# Patient Record
Sex: Male | Born: 1935 | Hispanic: No
Health system: Northeastern US, Academic
[De-identification: ages and names within clinical notes are randomized; demographics above are authoritative.]

---

## 2012-12-10 IMAGING — CR Chest Single AP view
1 series · 1 of 1 positions shown · non-contrast
Comparison: none

Final Report

Chest radiograph
  for patient Jimi, Proshen 
    male of [AGE]
CLINICAL INFORMATION: Shortness of breath.
TECHNIQUE: Frontal view of the chest were obtained.

[AP]
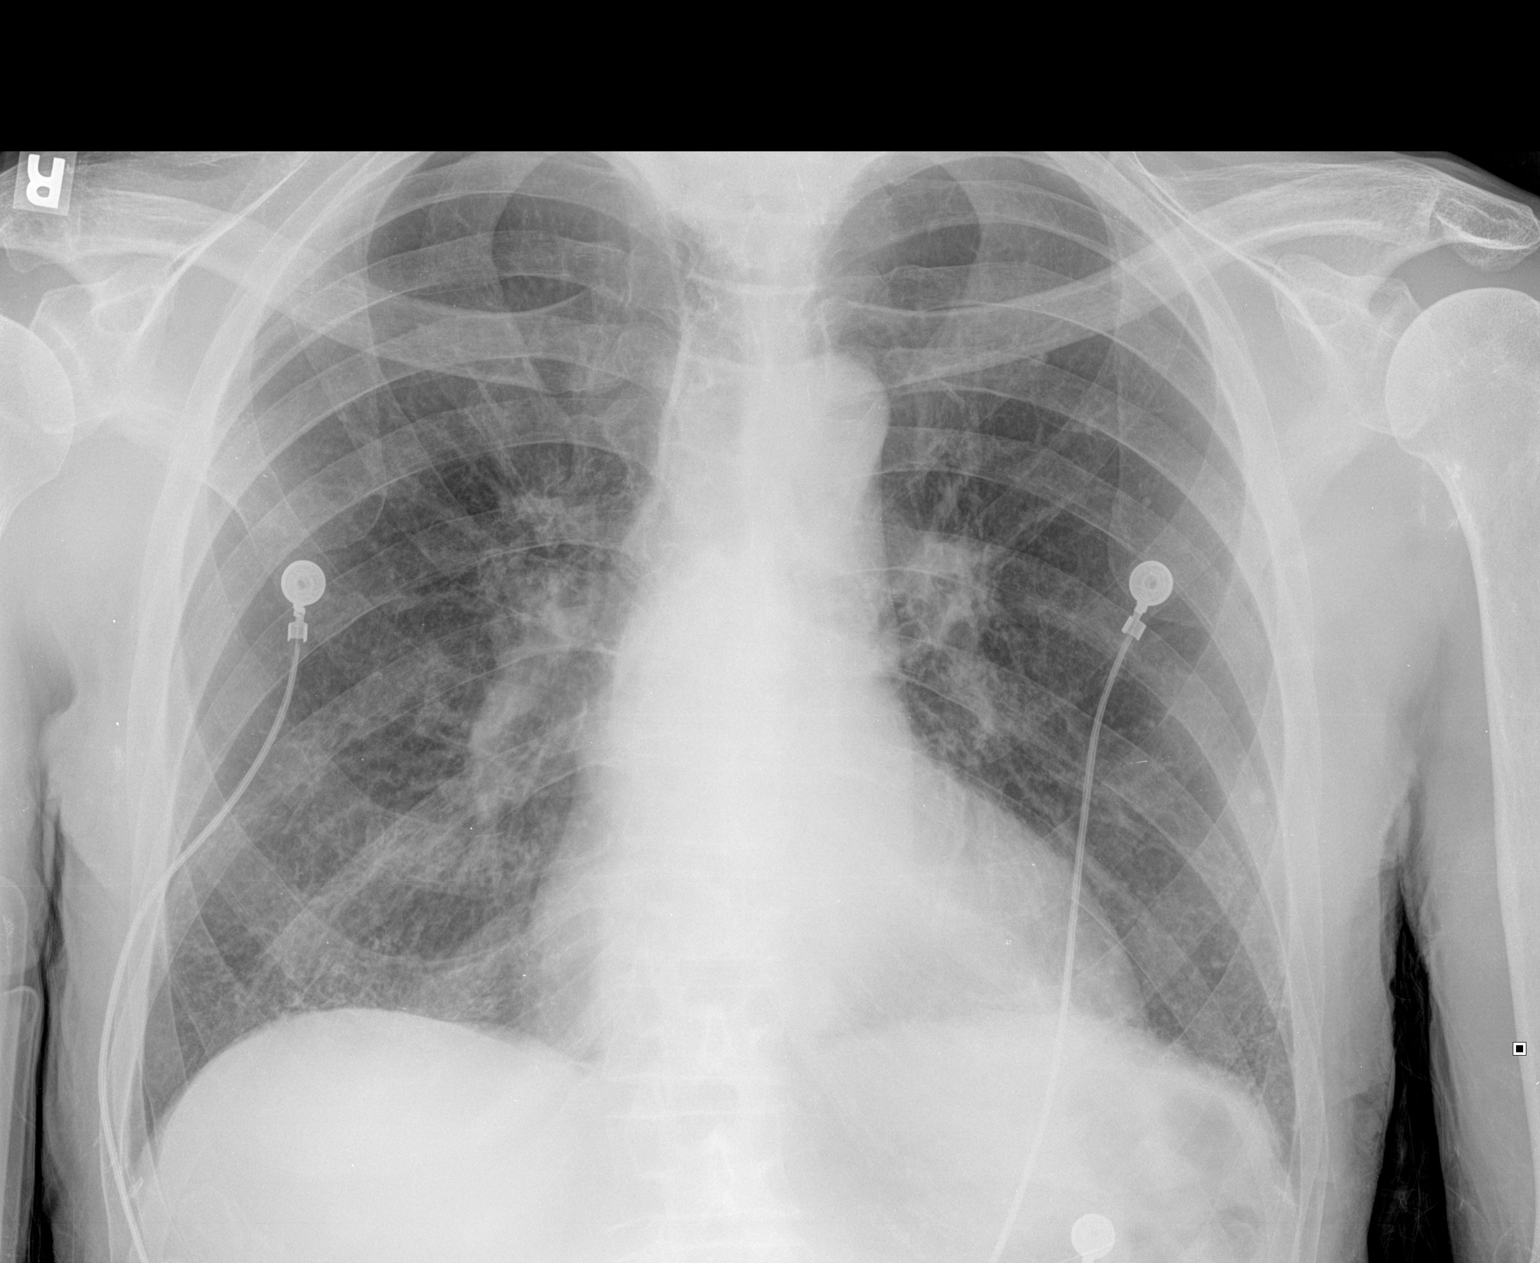

[1 of 1 positions shown; findings below may reference images not displayed]

FINDINGS: No prior studies were submitted for direct comparison.

The cardiomediastinal silhouette appears to be unremarkable.  The 

pulmonary vasculature is within normal limits.  There is no evidence 

of acute infiltrate.  The bilateral costophrenic angles are sharp.  

The visualized bony thoracic cage appears intact.  There is a 0.4 cm 

left midlung field calcified granuloma noted.
IMPRESSION: No evidence of acute pulmonary disease.

0.4 cm left midlung field calcified granuloma.

## 2019-11-07 LAB — SARS COV-2 (COVID-19) RNA: BKR SARS-COV-2 RNA (COVID-19) (YH): NOT DETECTED

## 2019-11-11 LAB — SARS COV-2 (COVID-19) RNA: BKR SARS-COV-2 RNA (COVID-19) (YH): NEGATIVE

## 2019-11-15 LAB — SARS COV-2 (COVID-19) RNA: BKR SARS-COV-2 RNA (COVID-19) (YH): NEGATIVE

## 2019-11-19 LAB — SARS COV-2 (COVID-19) RNA: BKR SARS-COV-2 RNA (COVID-19) (YH): NOT DETECTED

## 2019-11-29 LAB — SARS COV-2 (COVID-19) RNA: BKR SARS-COV-2 RNA (COVID-19) (YH): NOT DETECTED

## 2020-02-09 ENCOUNTER — Emergency Department: Admit: 2020-02-09 | Payer: PRIVATE HEALTH INSURANCE | Primary: Internal Medicine

## 2020-02-09 ENCOUNTER — Inpatient Hospital Stay: Admit: 2020-02-09 | Payer: PRIVATE HEALTH INSURANCE

## 2020-02-09 ENCOUNTER — Inpatient Hospital Stay
Admit: 2020-02-09 | Discharge: 2020-02-12 | Payer: MEDICARE | Source: Skilled Nursing Facility | Admitting: Internal Medicine

## 2020-02-09 ENCOUNTER — Encounter: Admit: 2020-02-09 | Payer: PRIVATE HEALTH INSURANCE | Primary: Internal Medicine

## 2020-02-09 DIAGNOSIS — K219 Gastro-esophageal reflux disease without esophagitis: Secondary | ICD-10-CM

## 2020-02-09 DIAGNOSIS — E569 Vitamin deficiency, unspecified: Secondary | ICD-10-CM

## 2020-02-09 DIAGNOSIS — F329 Major depressive disorder, single episode, unspecified: Secondary | ICD-10-CM

## 2020-02-09 DIAGNOSIS — M5417 Radiculopathy, lumbosacral region: Secondary | ICD-10-CM

## 2020-02-09 DIAGNOSIS — I4891 Unspecified atrial fibrillation: Secondary | ICD-10-CM

## 2020-02-09 DIAGNOSIS — S42212A Unspecified displaced fracture of surgical neck of left humerus, initial encounter for closed fracture: Secondary | ICD-10-CM

## 2020-02-09 DIAGNOSIS — E785 Hyperlipidemia, unspecified: Secondary | ICD-10-CM

## 2020-02-09 DIAGNOSIS — I480 Paroxysmal atrial fibrillation: Secondary | ICD-10-CM

## 2020-02-09 DIAGNOSIS — K589 Irritable bowel syndrome without diarrhea: Secondary | ICD-10-CM

## 2020-02-09 DIAGNOSIS — S72002A Fracture of unspecified part of neck of left femur, initial encounter for closed fracture: Secondary | ICD-10-CM

## 2020-02-09 DIAGNOSIS — F419 Anxiety disorder, unspecified: Secondary | ICD-10-CM

## 2020-02-09 DIAGNOSIS — M40209 Unspecified kyphosis, site unspecified: Secondary | ICD-10-CM

## 2020-02-09 DIAGNOSIS — J189 Pneumonia, unspecified organism: Secondary | ICD-10-CM

## 2020-02-09 DIAGNOSIS — E039 Hypothyroidism, unspecified: Secondary | ICD-10-CM

## 2020-02-09 DIAGNOSIS — H409 Unspecified glaucoma: Secondary | ICD-10-CM

## 2020-02-09 DIAGNOSIS — I10 Essential (primary) hypertension: Secondary | ICD-10-CM

## 2020-02-09 DIAGNOSIS — M1990 Unspecified osteoarthritis, unspecified site: Secondary | ICD-10-CM

## 2020-02-09 DIAGNOSIS — E119 Type 2 diabetes mellitus without complications: Secondary | ICD-10-CM

## 2020-02-09 LAB — BASIC METABOLIC PANEL
BKR ANION GAP: 8 (ref 5–18)
BKR BLOOD UREA NITROGEN: 23 mg/dL (ref 8–25)
BKR BUN / CREAT RATIO: 20.7 (ref 8.0–25.0)
BKR CALCIUM: 8.5 mg/dL (ref 8.4–10.3)
BKR CHLORIDE: 109 mmol/L (ref 95–115)
BKR CO2: 25 mmol/L (ref 21–32)
BKR CREATININE: 1.11 mg/dL (ref 0.50–1.30)
BKR EGFR (AFR AMER): >60 mL/min/{1.73_m2} (ref >60)
BKR EGFR (NON AFRICAN AMERICAN): >60 mL/min/{1.73_m2} (ref >60)
BKR GLUCOSE: 110 mg/dL — ABNORMAL HIGH (ref 70–100)
BKR OSMOLALITY CALCULATION: 287 mosm/kg (ref 275–295)
BKR POTASSIUM: 4.5 mmol/L (ref 3.5–5.1)
BKR SODIUM: 142 mmol/L (ref 136–145)

## 2020-02-09 LAB — CBC WITH AUTO DIFFERENTIAL
BKR WAM ABSOLUTE IMMATURE GRANULOCYTES: 0 x 1000/ÂµL (ref 0.0–0.2)
BKR WAM ABSOLUTE LYMPHOCYTE COUNT: 1.8 x 1000/ÂµL (ref 1.0–2.3)
BKR WAM ABSOLUTE NRBC: 0 x 1000/ÂµL (ref <=0.0)
BKR WAM ANALYZER ANC: 7.1 x 1000/ÂµL (ref 1.8–7.3)
BKR WAM BASOPHIL ABSOLUTE COUNT: 0.1 x 1000/ÂµL (ref 0.0–0.6)
BKR WAM BASOPHILS: 0.6 % (ref 0.0–2.0)
BKR WAM EOSINOPHIL ABSOLUTE COUNT: 0.3 x 1000/ÂµL (ref 0.0–0.4)
BKR WAM EOSINOPHILS: 3.2 % (ref 0.0–6.0)
BKR WAM HEMATOCRIT: 40.5 % — ABNORMAL LOW (ref 41.0–54.0)
BKR WAM HEMOGLOBIN: 13.3 g/dL — ABNORMAL LOW (ref 13.7–18.0)
BKR WAM IMMATURE GRANULOCYTES: 0.3 % (ref 0.0–2.0)
BKR WAM LYMPHOCYTES: 17.5 % (ref 14.0–43.0)
BKR WAM MCH (PG): 30.7 pg (ref 25.7–31.0)
BKR WAM MCHC: 32.8 g/dL (ref 32.0–36.0)
BKR WAM MCV: 93.5 fL (ref 80.0–99.0)
BKR WAM MONOCYTE ABSOLUTE COUNT: 0.9 x 1000/ÂµL (ref 0.4–1.3)
BKR WAM MONOCYTES: 8.5 % (ref 0.0–14.0)
BKR WAM MPV: 9.8 fL (ref 9.8–12.3)
BKR WAM NEUTROPHILS: 69.9 % (ref 38.0–74.0)
BKR WAM NUCLEATED RED BLOOD CELLS: 0 % (ref 0.0–0.2)
BKR WAM PLATELETS: 182 x1000/ÂµL (ref 140–446)
BKR WAM RDW-CV: 12.4 % (ref 11.5–14.5)
BKR WAM RED BLOOD CELL COUNT: 4.3 M/ÂµL — ABNORMAL LOW (ref 4.6–6.1)
BKR WAM WHITE BLOOD CELL COUNT: 10.2 x1000/ÂµL (ref 3.8–10.6)

## 2020-02-09 LAB — ZZZURINALYSIS WITH CULTURE REFLEX     (L Q)
BKR BILIRUBIN, UA: NEGATIVE
BKR BLOOD, UA: NEGATIVE
BKR GLUCOSE, UA: NEGATIVE
BKR KETONES, UA: NEGATIVE
BKR LEUKOCYTE ESTERASE, UA: NEGATIVE
BKR NITRITE, UA: NEGATIVE
BKR PH, UA: 6 (ref 5.5–7.5)
BKR PROTEIN, UA: NEGATIVE
BKR SPECIFIC GRAVITY, UA: 1.015 (ref 1.005–1.030)
BKR UROBILINOGEN, UA: <2 EU/dL (ref <=2.0)

## 2020-02-09 LAB — EXTRA BLUE TOP SPECIMEN     (BH GH LMW)

## 2020-02-09 LAB — TROPONIN I     (L Q): BKR TROPONIN I: 0.015 ng/mL (ref 0.000–1.500)

## 2020-02-09 LAB — SARS COV-2 (COVID-19) RNA: BKR SARS-COV-2 RNA (COVID-19) (YH): NEGATIVE

## 2020-02-09 MED ORDER — OXYCODONE IMMEDIATE RELEASE 5 MG TABLET
5 mg | Freq: Four times a day (QID) | ORAL | Status: DC | PRN
Start: 2020-02-09 — End: 2020-02-10

## 2020-02-09 MED ORDER — ACETAMINOPHEN 325 MG TABLET
325 mg | Freq: Four times a day (QID) | ORAL | Status: DC | PRN
Start: 2020-02-09 — End: 2020-02-09
  Administered 2020-02-09: 13:00:00 325 mg via ORAL

## 2020-02-09 MED ORDER — ALUMINUM-MAG HYDROXIDE-SIMETHICONE 200 MG-200 MG-20 MG/5 ML ORAL SUSP
200-200-205 mg/5 mL | Freq: Four times a day (QID) | ORAL | Status: DC | PRN
Start: 2020-02-09 — End: 2020-02-12

## 2020-02-09 MED ORDER — ACETAMINOPHEN 325 MG TABLET
325 mg | Freq: Three times a day (TID) | ORAL | Status: DC
Start: 2020-02-09 — End: 2020-02-12
  Administered 2020-02-09 – 2020-02-12 (×9): 325 mg via ORAL

## 2020-02-09 MED ORDER — SODIUM CHLORIDE 0.9 % (FLUSH) INJECTION SYRINGE
0.9 % | INTRAVENOUS | Status: DC | PRN
Start: 2020-02-09 — End: 2020-02-12

## 2020-02-09 MED ORDER — GUAIFENESIN 100 MG/5 ML ORAL LIQUID
1005 mg/5 mL | ORAL | Status: DC | PRN
Start: 2020-02-09 — End: 2020-02-12

## 2020-02-09 MED ORDER — METOPROLOL SUCCINATE ER (TOPROL) 12.5 MG HALFTAB
12.5 mg | Freq: Every day | ORAL | Status: DC
Start: 2020-02-09 — End: 2020-02-12
  Administered 2020-02-09 – 2020-02-12 (×4): 12.5 mg via ORAL

## 2020-02-09 MED ORDER — FAMOTIDINE 20 MG TABLET
20 mg | Freq: Every day | ORAL | Status: SS
Start: 2020-02-09 — End: 2020-02-12

## 2020-02-09 MED ORDER — ROSUVASTATIN 5 MG TABLET
5 mg | Freq: Every day | ORAL | Status: DC
Start: 2020-02-09 — End: 2020-02-12
  Administered 2020-02-09 – 2020-02-12 (×4): 5 mg via ORAL

## 2020-02-09 MED ORDER — PANTOPRAZOLE 40 MG TABLET,DELAYED RELEASE
40 mg | Freq: Every day | ORAL | Status: DC
Start: 2020-02-09 — End: 2020-02-12
  Administered 2020-02-09 – 2020-02-12 (×4): 40 mg via ORAL

## 2020-02-09 MED ORDER — SODIUM CHLORIDE 0.9 % (FLUSH) INJECTION SYRINGE
0.9 % | Freq: Three times a day (TID) | INTRAVENOUS | Status: DC
Start: 2020-02-09 — End: 2020-02-12
  Administered 2020-02-10 – 2020-02-11 (×2): 0.9 mL via INTRAVENOUS

## 2020-02-09 MED ORDER — MORPHINE 2 MG/ML INJECTION SYRINGE
2 mg/mL | Freq: Once | INTRAVENOUS | Status: CP
Start: 2020-02-09 — End: ?
  Administered 2020-02-09: 06:00:00 2 mL via INTRAVENOUS

## 2020-02-09 MED ORDER — ESCITALOPRAM 10 MG TABLET
10 mg | Freq: Every day | ORAL | Status: DC
Start: 2020-02-09 — End: 2020-02-12
  Administered 2020-02-09 – 2020-02-12 (×4): 10 mg via ORAL

## 2020-02-09 MED ORDER — TAMSULOSIN 0.4 MG CAPSULE
0.4 mg | Freq: Every evening | ORAL | Status: DC
Start: 2020-02-09 — End: 2020-02-09

## 2020-02-09 MED ORDER — TRAMADOL (ULTRAM) 25 MG HALFTAB
25 mg | Freq: Four times a day (QID) | ORAL | Status: DC | PRN
Start: 2020-02-09 — End: 2020-02-12
  Administered 2020-02-09 – 2020-02-12 (×3): 25 mg via ORAL

## 2020-02-09 MED ORDER — KETOTIFEN 0.025 % (0.035 %) EYE DROPS
0.0250.035 % (0.035 %) | Freq: Every day | OPHTHALMIC | Status: DC
Start: 2020-02-09 — End: 2020-02-12
  Administered 2020-02-09 – 2020-02-12 (×4): 0.025 mL via OPHTHALMIC

## 2020-02-09 MED ORDER — GADOTERATE MEGLUMINE 0.5 MMOL/ML (376.9 MG/ML) INTRAVENOUS SOLUTION
0.5 mmol/mL (376.9 mg/mL) | Freq: Once | INTRAVENOUS | Status: CP | PRN
Start: 2020-02-09 — End: ?
  Administered 2020-02-09: 19:00:00 0.5 mL via INTRAVENOUS

## 2020-02-09 MED ORDER — ZZ IMS TEMPLATE
Freq: Every morning | ORAL | Status: DC
Start: 2020-02-09 — End: 2020-02-12
  Administered 2020-02-09 – 2020-02-12 (×4): 175 mcg via ORAL

## 2020-02-09 MED ORDER — SODIUM CHLORIDE 0.9 % (FLUSH) INJECTION SYRINGE
0.9 % | INTRAVENOUS | Status: DC | PRN
Start: 2020-02-09 — End: 2020-02-12
  Administered 2020-02-09: 15:00:00 0.9 mL via INTRAVENOUS

## 2020-02-09 MED ORDER — FINASTERIDE 5 MG TABLET
5 mg | Freq: Every day | ORAL | Status: DC
Start: 2020-02-09 — End: 2020-02-12
  Administered 2020-02-09 – 2020-02-12 (×4): 5 mg via ORAL

## 2020-02-09 MED ORDER — TIMOLOL MALEATE 0.5 % EYE DROPS
0.5 % | Freq: Every day | OPHTHALMIC | Status: DC
Start: 2020-02-09 — End: 2020-02-12
  Administered 2020-02-09 – 2020-02-12 (×4): 0.5 mL via OPHTHALMIC

## 2020-02-09 MED ORDER — MORPHINE 2 MG/ML INJECTION SYRINGE
2 mg/mL | INTRAVENOUS | Status: DC | PRN
Start: 2020-02-09 — End: 2020-02-10

## 2020-02-09 MED ORDER — TAMSULOSIN 0.4 MG CAPSULE
0.4 mg | Freq: Every evening | ORAL | Status: DC
Start: 2020-02-09 — End: 2020-02-12
  Administered 2020-02-10 – 2020-02-12 (×3): 0.4 mg via ORAL

## 2020-02-09 MED ORDER — OLANZAPINE 2.5 MG TABLET
2.5 mg | Freq: Every evening | ORAL | Status: DC
Start: 2020-02-09 — End: 2020-02-12
  Administered 2020-02-10 – 2020-02-12 (×3): 2.5 mg via ORAL

## 2020-02-09 MED ORDER — SODIUM CHLORIDE 0.9 % (FLUSH) INJECTION SYRINGE
0.9 % | Freq: Three times a day (TID) | INTRAVENOUS | Status: DC
Start: 2020-02-09 — End: 2020-02-12
  Administered 2020-02-09 – 2020-02-11 (×3): 0.9 mL via INTRAVENOUS

## 2020-02-09 NOTE — Other
PHARMACY-ASSISTED MEDICATION REPORTPharmacist review of the best possible medication history obtained by the pharmacy medication history technician has been performed.  I have updated the home medication list and identified the following information that may be relevant to this admission. Recommendations:  Please note escitalopram deleted form med reconciliation module since  -reports therapy discontinued by provider per nursing facility. This medication is ordered for inpatient use, please assess appropriateness.  Notes:Pharm Tech (LS): Per nursing staff at the facility, following are NOT current home medications:Escitalopram 10mg  -reports therapy discontinued by provider.Home medication list updated and reviewed.  Pharm Tech (LS): Medication history completed through review of patient's W10 provided by [facility] in [place], [place], dated April 09,2021, review of pharmacy records and phone call to nursing staff at facility.Current Medications Medication Sig  acetaminophen (TYLENOL) 325 MG tablet Take 650 mg by mouth every 6 (six) hours as needed (Pain/Fever).   aluminum-magnesium hydroxide-simethicone (MAALOX) 200-200-20 mg/5 mL suspension Take 30 mLs by mouth every 6 (six) hours as needed for indigestion.   azelastine (OPTIVAR) 0.05 % ophthalmic solution Place 1 drop into both eyes daily.  bismuth subsalicylate (PEPTO BISMOL,KAOPECTATE) 262 mg/15 mL suspension Take 30 mLs by mouth every 6 (six) hours as needed (Diarrhea).  cholecalciferol, vitamin D3, 125 mcg (5,000 unit) tablet Take 5,000 Units by mouth daily.   famotidine (PEPCID) 20 mg tablet Take 20 mg by mouth daily.  finasteride (PROSCAR) 5 mg tablet Take 5 mg by mouth daily.  guaiFENesin (GERI-TUSSIN) 100 mg/5 mL Liqd Take 200 mg by mouth every 4 (four) hours as needed for cough. guaiFENesin (MUCINEX) 600 mg 12 hr extended release tablet Take 1 tablet (600mg ) by mouth every other day as needed for Congestion.  levothyroxine (SYNTHROID, LEVOTHROID) 175 MCG tablet Take 175 mcg by mouth every morning.   magnesium hydroxide (MILK OF MAGNESIA) 400 mg/5 mL suspension Take 30 mLs by mouth daily as needed for constipation.  melatonin 1 mg tablet Take 1 mg by mouth at bedtime.  metoprolol (TOPROL-XL) 25 MG 24 hr tablet Take 0.5 tablets (12.5 mg total) by mouth daily. Take with or immediately following a meal.  OLANZapine (ZYPREXA) 2.5 MG tablet Take 1 tablet (2.5 mg total) by mouth nightly.  omeprazole (PRILOSEC) 20 mg capsule Take 20 mg by mouth 2 times daily (0900, 1700).   pravastatin (PRAVACHOL) 20 MG tablet Take 1 tablet (20 mg total) by mouth daily with dinner.  tamsulosin (FLOMAX) 0.4 mg 24 hr capsule Take 2 capsules (0.8mg ) by mouth at bedtime.  timolol (TIMOPTIC) 0.5 % ophthalmic solution Place 1 drop into both eyes daily.  travoprost (TRAVATAN Z) 0.004 % ophthalmic solution Place 1 drop into both eyes daily.  Shearon Balo, PharmD2:10 PM4/9/2021Phone: Available via [employer] ([facility])

## 2020-02-09 NOTE — Plan of Care
Plan of Care Overview/ Patient Status    Transfer Note Nursing Keith Strickland is a 86 y.o. male transferred from ED with a chief complaint of post fall.  Assisted in to bed via hovermat.  Received patient is Alert and oriented to self .On RA  saturating appropriately  . No signs of Respiratory Distress. Denies Nausea and vomiting. Denies Chest pain and shortness of breath. Lung Sounds Clear upon auscultation. IV heplock, patent and intact. Abdomen soft and non tender. Bowel Sounds normoactive and noted x4 quadrants. +passing flatus.On BedrestWith arm sling to the left side, Pt reports pain when moving.Vitals:  02/09/20 0122 02/09/20 0223 02/09/20 0446 02/09/20 0520 BP: (!) 193/85 (!) 176/76 (!) 160/70 (!) 170/76 Pulse: (!) 54 (!) 59 60 60 Resp: (!) 26 20 18 20  Temp: 98.4 ?F (36.9 ?C)   97.8 ?F (36.6 ?C) TempSrc: Oral   Oral SpO2: 97% 97% 98% 97% Weight: 71.8 kg   71.8 kg Height:    5' 8 (1.727 m) Oriented to room settings ad use of callbell. Private aide at bedside. PO meds given crushed with apple sauce, tolerated well. Rounded patient. Patient is sleeping with no signs of distress. Vital signs taken afebrile and stable.Patient/Family acknowledge understanding of fall prevention education including to call nurse with assistance with ambulation.Denies pain at the moment. Hourly rounding and care done for comfort and safety. Will continue to monitorAide left In the morning. Medications reviewed See flowsheets, patient education and plan of care for additional information. Problem: Adult Inpatient Plan of CareGoal: Plan of Care ReviewOutcome: Interventions implemented as appropriateGoal: Patient-Specific Goal (Individualized)Outcome: Interventions implemented as appropriateGoal: Absence of Hospital-Acquired Illness or InjuryOutcome: Interventions implemented as appropriateGoal: Optimal Comfort and WellbeingOutcome: Interventions implemented as appropriateGoal: Readiness for Transition of CareOutcome: Interventions implemented as appropriate Problem: InfectionGoal: Infection Symptom ResolutionOutcome: Interventions implemented as appropriate

## 2020-02-09 NOTE — Plan of Care
Physical Therapy EvaluationPatient Overview - 02/09/20 1445    Date of Visit / Treatment  Date of Visit / Treatment  02/09/20   Note Type  Evaluation   Progress Report Due  02/09/20   Start Time  1:45pm   Total Treatment Time     Patient Overview  History of Present Illness  86 y.o. M 86 y.o. M PMHX: A-fib,Anxiety, HTN,DM,cavernous malformation, Dementia, COVID 19,gait disorder, BPH. Admitted 2? fall Fx L prox. Humerous(non displaced), Resident of [facility] w/24/7 care. Has RW and W/C.   Precautions  fall;non-weight bearing;left lower extremity   Precautions - Additional Details/Comments  Glaucoma, dementia, NWB L UE.   Social History  (S) other (comment)   Social History - Additional Details/Comments  Resides at [facility] w/pvt 24/7 HHA requires assist w/mobility/ADL's has RW and W/C. in home.   Prior Level of Function  assist with mobility;assist with ADLs   Subjective  I'am home/my L shoulder hurts.   General Observations  Supine in bed AxO to self    Assessment - 02/09/20 1454    Assessment  Cognition (Mentation/Communication)  (S) other (comment)  Has Dementia at base line responds well to verbal and tactile cues.  Skin Integrity/Edema  see skin documentation   Sensation  not tested   Range of Motion  within functional limits except (see comments)   Range of Motion - Additional Details/Comments  L UE N/A 2? SX,   Muscle Strength/Tone  within functional limits except (see comments)   Muscle Strength/Tone - Additional Details/Comments  L UE N/A 2? SX.   Balance  (S) other (comment)  Poor  Functional Mobility - 02/09/20 1457    Functional Mobility  Rolling  Moderate assist;Assist of 1   Supine to/from Sit  Moderate assist;Assist of 1   Sit to/from Stand  (S) Minimum assist;Assist of 1 person + 1 person to manage equipment  With stedy and high surface,  Sit to/from [employer]  (S) Not tested  2? pt going for MRI.  Bed to/from Chair  Not tested   Weight Bearing Restriction with Mobility  maintained   AMPAC Basic Mobility - 02/09/20 1503    AM-PAC - Basic Mobility Screen- How much help from another person do you currently need.....  Turning from your back to your side while in a a flat bed without using rails?  2 - A Lot - Requires a lot of help (maximum to moderate assistance). Can use assistive devices.   Moving from lying on your back to sitting on the side of a flat bed without using bed rails?  2 - A Lot - Requires a lot of help (maximum to moderate assistance). Can use assistive devices.   Moving to and from a bed to a chair (including a wheelchair)?  2 - A Lot - Requires a lot of help (maximum to moderate assistance). Can use assistive devices.   Standing up from a chair using your arms(e.g., wheelchair or bedside chair)?  2 - A Lot - Requires a lot of help (maximum to moderate assistance). Can use assistive devices.   To walk in a hospital room?  1 - Total - Requires total assistance or cannot do it at all.   Climbing 3-5 steps with a railing?  1 - Total - Requires total assistance or cannot do it at all.   AMPAC Mobility Score  10   Highest Level of Mobility TARGET  Mobility Level 4,Transfer to chair  Recommendations for IP Admission - 02/09/20 1504    PT Recommendations for Inpatient Admission  Activity/Level of Assist  (S) out of bed;transfers only;assist of 2;in room  stedy  ADL Recommendations  (S) assist to bathroom;assist of 2;bedside commode  stedy  Other/Comments  OOB<>Chair daily   Clinical Impression/Recommendation - 02/09/20 1505    Clinical Impression / Recommendation  Initial Assessment  86 y.o. M PMHX: As above. Recomend D/C home w/24/7 care and [facility] PT. CM: Ginny aware.   Patient Goal  return home   PT Frequency  Discharge   Reason for Discharge (PT)  no further needs identified Anticipated Discharge Disposition (PT)  Home;Home with 24 hour assistance;Home with Family support;Home Physical Therapy   Equipment Recommendations for Discharge  Patient has all necessary durable medical equipment   PT Handoff - 02/09/20 1507    Handoff Documentation  Handoff  Patient in bed   Handoff Comments  RN: Haylay in attendance

## 2020-02-09 NOTE — Utilization Review (ED)
Message left for Dr Ronita Hipps and Mariane Duval, CC re: patients status, will followUM Status: UR/MD agree on IP status Spoke with Ginny, CC Dr Romona Curls would like patient to remain inpatient, for multiple fracturesChristine Perlie Gold, RN BSNUtilization [phone]

## 2020-02-09 NOTE — Progress Notes
[facility] Progress NoteAttending Provider: Marshell Levan, MD Subjective                                                                              Subjective: Interim History: alert in bed this am, reports some soreness in LUE but no major pain at rest; no dyspnea, fevers, chillsReview of Allergies/Meds/Hx: Review of Allergies/Meds/Hx:I have reviewed the patient's: allergies and current scheduled medications Objective Objective: Vitals:Last 24 hours: Temp:  [97.6 ?F (36.4 ?C)-98.4 ?F (36.9 ?C)] 97.6 ?F (36.4 ?C)Pulse:  [54-60] 56Resp:  [18-26] 20BP: (142-193)/(70-85) 142/76SpO2:  [97 %-98 %] 97 %I/O's:No intake or output data in the 24 hours ending 02/09/20 1419Procedures:Physical Exam Constitutional: No distress. HENT: Head: Normocephalic and atraumatic. Eyes: Pupils are equal, round, and reactive to light. EOM are normal. Neck: Normal range of motion. Neck supple. No JVD present. Cardiovascular: irregular Pulmonary/Chest: Effort normal and breath sounds normal. Abdominal: Soft. Bowel sounds are normal. Musculoskeletal:    Comments: LUE in sling Neurological: No cranial nerve deficit. Alert, clear sensorium; answering simple questions appropriately; has poor vision and is HOH as well; moving all extremities Psychiatric: He has a normal mood and affect. His behavior is normal. Labs:Last 24 hours: Recent Results (from the past 24 hour(s)) EKG  Collection Time: 02/09/20  1:23 AM Result Value Ref Range  Heart Rate 54 bpm  QRS Duration 132 ms  Q-T Interval 474 ms  QTC Calculation(Bezet) 449 ms  P Axis 69 deg  R Axis -76 deg  T Axis 37 deg  P-R Interval 366 msec  ECG - SEVERITY Abnormal ECG severity Troponin I  Collection Time: 02/09/20  1:28 AM Result Value Ref Range  Troponin I 0.015 0.000 - 1.500 ng/mL Extra blue top specimen  Collection Time: 02/09/20  1:28 AM Result Value Ref Range  Hold Specimen Hold  Blood Bank extra specimen  Collection Time: 02/09/20  1:28 AM Result Value Ref Range  Hold BB RECVD  CBC auto differential  Collection Time: 02/09/20  1:28 AM Result Value Ref Range  WBC 10.2 3.8 - 10.6 x1000/?L  RBC 4.3 (L) 4.6 - 6.1 M/?L  Hemoglobin 13.3 (L) 13.7 - 18.0 g/dL  Hematocrit 16.1 (L) 09.6 - 54.0 %  MCV 93.5 80.0 - 99.0 fL  MCHC 32.8 32.0 - 36.0 g/dL  RDW-CV 04.5 40.9 - 81.1 %  Platelets 182 140 - 446 x1000/?L  MPV 9.8 9.8 - 12.3 fL  ANC (Abs Neutrophil Count) 7.1 1.8 - 7.3 x 1000/?L  Neutrophils 69.9 38.0 - 74.0 %  Lymphocytes 17.5 14.0 - 43.0 %  Absolute Lymphocyte Count 1.8 1.0 - 2.3 x 1000/?L  Monocytes 8.5 0.0 - 14.0 %  Monocyte Absolute Count 0.9 0.4 - 1.3 x 1000/?L  Eosinophils 3.2 0.0 - 6.0 %  Eosinophil Absolute Count 0.3 0.0 - 0.4 x 1000/?L  Basophil 0.6 0.0 - 2.0 %  Basophil Absolute Count 0.1 0.0 - 0.6 x 1000/?L  Immature Granulocytes 0.3 0.0 - 2.0 %  Absolute Immature Granulocyte Count 0.0 0.0 - 0.2 x 1000/?L  nRBC 0.0 0.0 - 0.2 %  Absolute nRBC 0.0 <=0.0 x 1000/?L  MCH 30.7 25.7 - 31.0 pg Basic metabolic panel  Collection Time: 02/09/20  1:28 AM Result Value Ref Range  Sodium 142 136 - 145 mmol/L  Potassium 4.5 3.5 - 5.1 mmol/L  Chloride 109 95 - 115 mmol/L  CO2 25 21 - 32 mmol/L  Anion Gap 8 5 - 18  Glucose 110 (H) 70 - 100 mg/dL  BUN 23 8 - 25 mg/dL  Creatinine 4.09 8.11 - 1.30 mg/dL  Calcium 8.5 8.4 - 91.4 mg/dL  BUN/Creatinine Ratio 78.2 8.0 - 25.0  Osmolality Calculation 287 275 - 295 mOsm/kg  eGFR (Afr Amer) >60 >60 mL/min/1.61m2  eGFR (NON African-American) >60 >60 mL/min/1.42m2 SARS CoV-2 (COVID-19) RNA - [facility] LM[facility] Y[facility])  Collection Time: 02/09/20  1:29 AM  Specimen: Nasopharynx; Viral Result Value Ref Range  SARS-CoV-2 RNA (COVID-19) Negative Negative UA with culture reflex  Collection Time: 02/09/20  2:35 AM Specimen: Urine Result Value Ref Range  Clarity, UA Clear Clear  Color, UA Yellow Yellow  Specific Gravity, UA 1.015 1.005 - 1.030  pH, UA 6.0 5.5 - 7.5  Protein, UA Negative Negative-Trace  Glucose, UA Negative Negative  Ketones, UA Negative Negative  Blood, UA Negative Negative  Bilirubin, UA Negative Negative  Leukocytes, UA Negative Negative  Nitrite, UA Negative Negative  Urobilinogen, UA <2.0 <=2.0 EU/dL Diagnostics:Solomons pelvisThe patient is status post left hip arthroplasty. There is no radiographic evidence of hardware complication or failure. There is an age-indeterminate L3 compression fracture with lytic components. There is 4 mm of retropulsion resulting in severe spinal canal stenosis. There is a nonaggressive sclerotic density within the right sacrum. The visualized pelvic viscera are unremarkable. There is no bowel obstruction, free fluid, or free air. There is no pelvic adenopathy.  Impression: ? IMPRESSION: Age-indeterminate L3 compression fracture with lytic components and 4 mm of retropulsion resulting in severe canal stenosis. This raises concern for pathological fracture.  Left shoulder xray:FINDINGS: There is a fracture of the surgical neck of the left humerus with some impaction and medial displacement not fully assessed on the single view.  Impression: ? IMPRESSION: Proximal left humeral fracture.   Assessment Assessment: 86yo male, DNR, from the [facility] (assisted living) w/ atrial fibrillation, hypertension, diabetes?not on medication, cavernous malformation, memory loss, hx of COVID, gait disorder, anxiety,  BPH, hypothyroidism admitted after mechanical fall with fracture left humerus Plan Plan: # Fall with Humerus Fracture - no weight bearing on LUE; arm sling; follow with ortho in 2 weeks with repeat xray ( Dr Alfredo Batty or Dr Lyman Bishop) - PT/OT Evaluation- scheduled Tylenol; prn Tramadol/Oxycodone PRN Pain?# Hx of HTN/HLD -Continue PTA Metoprolol (ER 12.5 mg qDay) and Pravastatin (Sub Crestor)?# Hx of Hypothyroidism - - Continue PTA Synthroid (175 mcg qDay)?# Hx of Mood Disorder - Olanzapine (2.5 mg qHS)?# Hx of GERD -- Continue PTA Omeprazole (Sub Pantoprazole)?# Hx of BPH -- Continue PTA Finasteride (5 mg qDay) and Tamsulosin?# Hx of Glaucoma -- Continue PTA Azelastine (Sub Ketotifen) and Timolol# fracture of L3 on [place] - PT to assess, MRI ( ? Pathologic fracture) DNR Electronically Signed:[id number] Trinnity Breunig, MD Beeper 02/09/2020, 2:18 PM

## 2020-02-09 NOTE — ED Provider Notes
? 2014 CD-Notes ?  All Rights [facility] Emergency Department     Chief Complaint:  Chief Complaint   Patient presents with   ? Fall >65     pt bib [facility] from [facility] s/p mechanical fall while going to the bathroom.  aide was present. denies LOC, head strike. Pt presents alert, c/o left shoulder and left hip pain.   ? Shoulder Pain   ? Hip Pain     HPI:  Keith Strickland, 86 y.o., male, biba for evaluation for a mechanical fall at standing height. The patient's main complaints is pain to his left shoulder. The patient denies hip pain, leg pain, head trauma, neck pain, feeling lighthead or dizziness. Per aide his vision is impaired, he walks with walker and his gait is unsteady as a result. The patient denies feeling lightheaded, dizzy, chest pain, or shortness of breath preceding the fall. Denies head trauma and LOC.     Historian:   patient and aide  Review of Systems:  Constitution:  Denies: fever, chills  EYES:  Negative for vision changes  RESP:  Negative for cough, shortness of breath  CARDIO: Negative for chest pain, palpitations  GI:  Negative for abdominal pain, nausea/vomiting  MUSC:  +right shoulder pain  SKIN:  Negative for rash  NEURO: Denies: dizzy/vertigo, headache, focal weakness, numbness/tingling, loss of consciousness  Past Medical History:  Past Medical History:   Diagnosis Date   ? A-fib (HC Code)    ? Anxiety    ? Closed displaced fracture of left femoral neck (HC Code)    ? DM (diabetes mellitus) (HC Code)    ? GERD (gastroesophageal reflux disease)    ? GERD (gastroesophageal reflux disease)    ? Glaucoma    ? HTN (hypertension)    ? Hyperlipidemia    ? Hypertension    ? Hypothyroid    ? Hypothyroidism    ? IBS (irritable bowel syndrome)    ? Kyphosis    ? Lumbosacral radiculopathy    ? Major depression    ? Osteoarthritis    ? Paroxysmal atrial fibrillation (HC Code)    ? Pneumonia    ? Vitamin deficiency      Past Surgical History:   Procedure Laterality Date   ? FRACTURE SURGERY       Social History:  Social History     Tobacco Use   ? Smoking status: Never Smoker   ? Smokeless tobacco: Never Used   Substance Use Topics   ? Alcohol use: No   ? Drug use: No     Family History:  Family History   Family history unknown: Yes     Medications:     Medication List      ASK your doctor about these medications    acetaminophen 325 mg tablet  Commonly known as: TYLENOL     aluminum-magnesium hydroxide-simethicone 200-200-20 mg/5 mL suspension  Commonly known as: MAALOX     azelastine 0.05 % ophthalmic solution  Commonly known as: OPTIVAR     bismuth subsalicylate 262 mg/15 mL suspension  Commonly known as: PEPTO BISMOL,KAOPECTATE     Carafate 100 mg/mL suspension  Generic drug: sucralfate     cholecalciferol (vitamin D3) 125 mcg (5,000 unit) tablet     cimetidine 200 mg tablet  Commonly known as: TAGAMET     escitalopram oxalate 10 mg tablet  Commonly known as: LEXAPRO     finasteride 5 mg  tablet  Commonly known as: PROSCAR     levothyroxine 175 mcg tablet  Commonly known as: SYNTHROID, LEVOTHROID     magnesium hydroxide 400 mg/5 mL suspension  Commonly known as: MILK OF MAGNESIA     melatonin 1 mg tablet     metoprolol succinate XL 25 mg 24 hr tablet  Commonly known as: TOPROL-XL  Take 0.5 tablets (12.5 mg total) by mouth daily. Take with or immediately following a meal.     * Mucinex 600 mg 12 hr extended release tablet  Generic drug: guaiFENesin     * Geri-Tussin 100 mg/5 mL Liqd  Generic drug: guaiFENesin     OLANZapine 2.5 mg tablet  Commonly known as: ZyPREXA  Take 1 tablet (2.5 mg total) by mouth nightly.     omeprazole 20 mg capsule  Commonly known as: PriLOSEC     pravastatin 20 mg tablet  Commonly known as: PRAVACHOL  Take 1 tablet (20 mg total) by mouth daily with dinner.     Senna Lax 8.6 mg tablet  Generic drug: senna     tamsulosin 0.4 mg 24 hr capsule  Commonly known as: FLOMAX     timolol 0.5 % ophthalmic solution  Commonly known as: TIMOPTIC Travatan Z 0.004 % ophthalmic solution  Generic drug: travoprost         * This list has 2 medication(s) that are the same as other medications prescribed for you. Read the directions carefully, and ask your doctor or other care provider to review them with you.              Allergies as of 02/09/2020 - Review Complete 02/09/2020   Allergen Reaction Noted   ? Gluten protein Other (See Comments) 10/29/2013   ? Lactose Other (See Comments) 10/29/2013   ? Avelox [moxifloxacin]  02/16/2016   ? Ciprofloxacin  11/10/2018   ? Peanut  02/16/2016   ? Penicillins  10/28/2013   ? Sulfa (sulfonamide antibiotics)  10/28/2013     Physical Exam:  Vital signs were reviewed  BP (!) 176/76  - Pulse (!) 59  - Temp 98.4 ?F (36.9 ?C) (Oral)  - Resp 20  - Wt 71.8 kg  - SpO2 97%  - BMI 24.07 kg/m?       General Appear: Elderly male in moderate painful distress    Head:   normocephalic, no masses, lesions, tenderness or       abnormalities   Ears/Nose/Throat: ENT exam normal, no neck nodes or sinus tenderness       and throat normal without erythema or exudate   Eyes:   PERRL, EOMI, conjunctivae clear, anicteric   Neck:   supple, full range of motion, no JVD and no      lymphadenopathy   Cardiovascular: +S1+S2, Regular rate and rhythm.  No murmurs.   Respiratory:  clear without rales, rhonchi or wheezes   Chest:   Chest wall tenderness on palpation over the left anterior and lateral chest wall. No crepitus or bony step off.   Back:    Negative:  FROM, no midline or c-spine tenderness, no step offs palpated    Abdomen:  soft, nontender, nondistended; normoactive bowel sounds. No RRG.   Pelvic:   Pelvis stable to rocking and palpation   Musculoskeletal: Soft tissue swelling over left humerus, Left shoulder exam markedly limited due to pain. No deformity noted to left elbow or wrist. There is left hand ecchymosis noted over the dorsal aspect with a small skin  tear and bony tenderness on palpation. No gross deformity on digits appreciated. Left hip with FROM without pain. No deformity noted over the bilateral femurs, knees, lower legs, and ankles.      Skin:   Capillary refill <2 seconds at fingers and toes, skin warm and dry   Neurologic:  Alert and oriented X3; non-focal neurologic exam       Medical Decision Making:  Nursing notes were reviewed: Yes    I reviewed the following historical information: Medical Records    Oxygen saturation interpretation: 97% on RA    Laboratory studies: Yes  Results for orders placed or performed during the hospital encounter of 02/09/20   SARS CoV-2 (COVID-19) RNA - [facility] ([facility])    Specimen: Nasopharynx; Viral   Result Value Ref Range    SARS-CoV-2 RNA (COVID-19) Negative Negative   Troponin I   Result Value Ref Range    Troponin I 0.015 0.000 - 1.500 ng/mL   CBC auto differential   Result Value Ref Range    WBC 10.2 3.8 - 10.6 x1000/?L    RBC 4.3 (L) 4.6 - 6.1 M/?L    Hemoglobin 13.3 (L) 13.7 - 18.0 g/dL    Hematocrit 96.0 (L) 41.0 - 54.0 %    MCV 93.5 80.0 - 99.0 fL    MCHC 32.8 32.0 - 36.0 g/dL    RDW-CV 45.4 09.8 - 11.9 %    Platelets 182 140 - 446 x1000/?L    MPV 9.8 9.8 - 12.3 fL    ANC (Abs Neutrophil Count) 7.1 1.8 - 7.3 x 1000/?L    Neutrophils 69.9 38.0 - 74.0 %    Lymphocytes 17.5 14.0 - 43.0 %    Absolute Lymphocyte Count 1.8 1.0 - 2.3 x 1000/?L    Monocytes 8.5 0.0 - 14.0 %    Monocyte Absolute Count 0.9 0.4 - 1.3 x 1000/?L    Eosinophils 3.2 0.0 - 6.0 %    Eosinophil Absolute Count 0.3 0.0 - 0.4 x 1000/?L    Basophil 0.6 0.0 - 2.0 %    Basophil Absolute Count 0.1 0.0 - 0.6 x 1000/?L    Immature Granulocytes 0.3 0.0 - 2.0 %    Absolute Immature Granulocyte Count 0.0 0.0 - 0.2 x 1000/?L    nRBC 0.0 0.0 - 0.2 %    Absolute nRBC 0.0 <=0.0 x 1000/?L    MCH 30.7 25.7 - 31.0 pg   Basic metabolic panel   Result Value Ref Range    Sodium 142 136 - 145 mmol/L    Potassium 4.5 3.5 - 5.1 mmol/L    Chloride 109 95 - 115 mmol/L    CO2 25 21 - 32 mmol/L    Anion Gap 8 5 - 18    Glucose 110 (H) 70 - 100 mg/dL    BUN 23 8 - 25 mg/dL    Creatinine 1.47 8.29 - 1.30 mg/dL    Calcium 8.5 8.4 - 56.2 mg/dL    BUN/Creatinine Ratio 20.7 8.0 - 25.0    Osmolality Calculation 287 275 - 295 mOsm/kg    eGFR (Afr Amer) >60 >60 mL/min/1.45m2    eGFR (NON African-American) >60 >60 mL/min/1.33m2   UA with culture reflex    Specimen: Urine   Result Value Ref Range    Clarity, UA Clear Clear    Color, UA Yellow Yellow    Specific Gravity, UA 1.015 1.005 - 1.030    pH, UA 6.0 5.5 - 7.5  Protein, UA Negative Negative-Trace    Glucose, UA Negative Negative    Ketones, UA Negative Negative    Blood, UA Negative Negative    Bilirubin, UA Negative Negative    Leukocytes, UA Negative Negative    Nitrite, UA Negative Negative    Urobilinogen, UA <2.0 <=2.0 EU/dL   EKG   Result Value Ref Range    Heart Rate 54 bpm    QRS Duration 132 ms    Q-T Interval 474 ms    QTC Calculation(Bezet) 449 ms    P Axis 69 deg    R Axis -76 deg    T Axis 37 deg    P-R Interval 366 msec    ECG - SEVERITY Abnormal ECG severity       Radiologic Imaging studies:      CXR: No focal infiltrate    Left shoulder: (+) fracture    Left Hand: ? Nondisplaced fracture 5th MCP shaft    [place] Head: Findings:  There is no evidence of intracranial hemorrhage, mass effect or midline shift. There are periventricular  white matter hypodensities, compatible with chronic small vessel ischemia. Again noted is a 3 mm  hyperdensity in the right temporal lobe/right sylvian fissure (axial images 14/52, coronal image 39/68,  sagittal image 48/68), which appears slightly faint compared to the prior study. There is significant  volume loss. There is atheromatous calcification of the intracranial arteries. Again noted is a partial  empty sella. The calvarium is unremarkable. There is mild mucosal thickening in the right maxillary  and bilateral ethmoidal sinuses. The mastoid air cells and the other visualized paranasal sinuses are  clear.  Impression:  1. No evidence of intracranial hemorrhage, mass effect or calvarial fracture.  2. Chronic small vessel ischemia and volume loss.  3. A 3 mm hyperdensity in the right temporal lobe/right sylvian fissure appearing slightly faint  compared to prior study; likely representing calcification within the right MCA branch; however  possibility of a small fusiform/saccular aneurysm cannot be excluded. Recommend further  evaluation with MRI/MRA if clinically indicated      [place] Chest: Findings:  Emphysematous changes are noted in the lungs. There is mild bronchiectasis with bronchial wall  thickening of the lungs, predominantly in both lower lobes. Bibasilar dependent atelectasis is seen.  There is no pneumothorax or pleural effusion.  The thoracic aorta demonstrates atheromatous calcification. There is 3.8 cm fusiform ectasia of the  ascending thoracic aorta. Mild cardiomegaly is seen. There is no pericardial effusion. No evidence of  mediastinal hematoma on this noncontrast study.  There is an acute comminuted displaced fracture at the proximal shaft of the left humerus with  adjacent soft tissue swelling (coronal images 54-62/136). Again noted is a chronic superior endplate  compression fracture of the T3 vertebra.  Nonspecific perinephric fat stranding is noted bilaterally. There is thickening versus underdistention of  the stomach, incompletely imaged.  Impression:  Acute comminuted displaced fracture at the proximal shaft of the left humerus  No evidence of acute visceral injury to the thorax on this noncontrast study    [place] Pelvis:Findings:  There is an age indeterminate superior endplate compression fracture of the L3 vertebra with sclerosis  and lucency, suspicious for a pathological fracture. There is 4 mm retropulsion of the posterior  fragment causing moderate to severe spinal canal stenosis.  The bones are osteopenic. Osseous degenerative changes are identified in the spine and right hip  joint. Left hip prosthesis is seen, appears intact.  No evidence  of bowel obstruction. The urinary bladder is markedly distended. There is no free fluid or  free air.  Impression:  Age indeterminate superior endplate compression fracture of the L3 vertebra with sclerosis and  lucency, suspicious for a pathological fracture. A 4 mm retropulsion of the posterior fragment causing  moderate to severe spinal canal stenosis. Recommend further evaluation with MRI, as clinically  indicated.    Independently interpreted by ED provider:  X-ray    Discussed patient with another provider:  Radiologist       ED Course/Assessment/Plan:    86 y.o. male presents with mechanical fall from standing height. Concerned for possible left humerus fx. Clinically there is no evidence of a hip fx. Because of the patient's age and limitation in exam will obtain a [place]as of the head, Chest, Pelvis, XR of the left shoulder, and left hip. Morphine ordered.  EKG shows no injury pattern or arrhythmia.    3:07 AM  X-ray suspicious for an acute fracture of the left proximal humerus which is likely the culprit causing the majority of his pain.  There may be a nondisplaced left 5th MCP shaft fracture. No evidence of acute hip or pelvis fracture, however radiologist noted the possibility of a L3 vertebra fracture suspicious for being pathological.  Given that the patient's gait is poor, requires ambulation with a walker, having her left arm immobilized in a sling would certainly impair mobility.  The patient will need to be admitted to the hospital for PT evaluation and pain control prior to discharge back to nursing home for safe discharge.    3:58 AM  Case discussed with hospitalist Dr. Katrinka Blazing.    ______________________________________________________________________  Condition:  Stable  Disposition:  Admitted  Diagnosis:  The primary encounter diagnosis was Closed fracture of proximal end of left humerus, unspecified fracture morphology, initial encounter. Diagnoses of Hand fracture, left, closed, initial encounter and Fall, initial encounter were also pertinent to this visit.    By signing my name below, I, Kirby Funk, attest that this documentation has been prepared under the direction and in the presence of Jearld Fenton Shang-Tung*   Electronically Signed: Kirby Funk, scribe. 02/09/2020. 1:40 AM.  Macy Mis*, personally performed the services described in this documentation. All medical record entries made by the scribe were at my direction and in my presence. I have reviewed the chart and discharge instructions and agree that the record reflects my personal performance and is accurate and complete. Jearld Fenton Shang-Tung*. 02/09/2020. 1:40 AM.      ?2014 CD-Notes?  LLC All Rights Reserved; version 2.0; revised November, 2018.  cdnotes/ffall               Phylliss Blakes, MD  02/09/20 [id number]

## 2020-02-09 NOTE — Plan of Care
Plan of Care Overview/ Patient Status    SOCIAL WORK NOTEPatient Name: Keith Strickland Record Number: [id number] Date of Birth: 01-22-1920Social Work Follow Up    Most Recent Value Document Type  Progress Note (For Inpatient/ED Only) Prior psychosocial assessment has been documented within 30 days of this hospitalization  No Reason for [facility] Resources Intervention  Resource Needs Assessment & Referral Resource Needs Assessment & Referral  Community Resources Source of Information  Medical Team, Family/Caregiver Family/Caregiver Comment  Daughter is in agreement with this plan as a back up in case patient cannot return to Assisted Living residence. Medical Team Comment  RN Case Manager requested Screen Record Reviewed  Yes Level of Care  Inpatient Identified Clinical/Disposition, Issues/Barriers:  Social Work consult for [facility] Screen Intervention(s)/Summary  5 minutes spent completing Screen and signing for patient's daughter who is in agreement with plan for rehab at the [facility] if he cannot return directly to the Assisted Living at [place]. Collaboration with Treatment Team/Community Providers/Family:  Screen faxed to [facility] Admissions Outcome  Resolved Handoff Required?  No Barriers to Discharge  no barriers identified Next Steps/Plan (including hand-off):  Case Management will arrange for discharge to destination best for patient when he is stable for discharge. Signature:  Jettie Pagan, [place] Contact Information:  [phone]

## 2020-02-09 NOTE — ED Notes
Fit pt with arm sling to left arm/shoulder. Pt tolerated well.

## 2020-02-09 NOTE — Plan of Care
Plan of Care Overview/ Patient Status    Pt received in bed. A+Ox self. C/o mild-mod pain to left shoulder, sling in place. +RP. Denies numbness/tingling to LUE.Lungs CTAB on room air.  Tolerated diet with total feed assistance. Pills crushed in applesauce.Patient refusing OOB this AM, will re-attempt.Skin appears intact. Glasses in place. Belongings at bedside. Bed alarm on. Patient/Family acknowledge understanding of fall prevention education including to call nurse with assistance with ambulation.[id number] patient unable to void spontaneously, bladder scan 615cc. Dr. Romona Curls made [id number] Straight cath completed for 550 clear, smelly amber urine.Problem: Adult Inpatient Plan of CareGoal: Plan of Care ReviewOutcome: Interventions implemented as appropriateFlowsheets (Taken 02/09/2020 0834)Progress: no changePlan of Care Reviewed With: patientGoal: Patient-Specific Goal (Individualized)Outcome: Interventions implemented as appropriateFlowsheets (Taken 02/09/2020 0834)Individualized Care Needs: SafetyWhat Anxieties, Fears, Concerns or Questions Do You Have About Your Care?: None statedPatient Centered Daily Goal: Safety and comfort maintaindPatient Centered Long Term Goal: RecoveryGoal: Absence of Hospital-Acquired Illness or InjuryOutcome: Interventions implemented as appropriateGoal: Optimal Comfort and WellbeingOutcome: Interventions implemented as appropriateGoal: Readiness for Transition of CareOutcome: Interventions implemented as appropriate Problem: InfectionGoal: Infection Symptom ResolutionOutcome: Interventions implemented as appropriate Problem: Skin Injury Risk IncreasedGoal: Skin Health and IntegrityOutcome: Interventions implemented as appropriate Problem: Fall Injury RiskGoal: Absence of Fall and Fall-Related InjuryOutcome: Interventions implemented as appropriate

## 2020-02-09 NOTE — Plan of Care
Plan of Care Overview/ Patient Status   Problem: Occupational Therapy GoalsGoal: Occupational Therapy GoalsOutcome: Outcome(s) achievedNote: Pt will participate in OT evaluation.Pt will perform bed mobility with Mod A x 1.Pt will tolerate wearing sling to L UE.  Occupational Therapy EvaluationPatient Overview - 02/09/20 1431    Date of Visit / Treatment  Date of Visit / Treatment  02/09/20   Note Type  Evaluation   Start Time  205   End Time  230   Total Treatment Time  25    Patient Overview  History of Present Illness  Pt is a 86yo RHD male, DNR, from the [facility] w/ PMHx: atrial fibrillation, anxiety, hypertension, diabetes not on medication, cavernous malformation, memory loss/dememtia, hx of COVID, gait disorder, anxiety,  BPH, hypothyroidism admitted after mechanical fall with fracture left humerus.   Precautions  (S) fall;non-weight bearing;left upper extremity   Precautions - Additional Details/Comments  glaucoma; dementia; NWB L UE   Social History  other (comment)   Social History - Additional Details/Comments  Pt lives at [facility]. Pt has private 24/7 aide at baseline and requires assist with mobility and ADLs w/ RW and w/c. Pt also was receiving home PT.   Prior Level of Function  independent with assistive device;assist with ADLs   Subjective  I'm at home.    General Observations  Pt presents supine in bed, resting with eyes close but awake. A & O x self only.   Modified Rankin - 02/09/20 1435    Modified Rankin Scale  Pre Modified Rankin Scale  4 -->Moderately severe disability, unable to walk without assistance and unable to attend to own bodily needs without assistance   Current Modified Rankin Scale  4 -->Moderately severe disability, unable to walk without assistance and unable to attend to own bodily needs without assistance   Assessment - 02/09/20 1436    Assessment  Cognition (Mentation/Communication)  other (comment)   Cognition - Additional Details/Comments  Hx of dementia; Oriented to self; Disoriented to place, time, situation.   Vital Signs  vital signs stable  171/77  Pain Location - Additional Details/Comments  Pt unable to rate pain, states he has a lot of pain in L arm. RN aware   Skin Integrity/Edema  see skin documentation   Sensation  other (comment)   Sensation - Additional Details/Comments  Unable to accurately assess sensation 2/2 cognition   Range of Motion  within functional limits except (see comments)   Range of Motion - Additional Details/Comments  L UE not tested 2/2 fracture; R UE WFL for age;   Muscle Strength/Tone  within functional limits except (see comments)   Muscle Strength/Tone - Additional Details/Comments  L UE not tested 2/2 fracture; R UE WFL for age;   Balance - Additional Details/Comments  poor (-)   Functional Mobility - 02/09/20 1438    Functional Mobility  Rolling  Moderate assist;Assist of 1;Verbal cues;Nonverbal cues   Supine to/from Sit  Moderate assist;Assist of 1;Verbal cues;Nonverbal cues   Sit to/from Stand  Total assist/dependent;Assist of 2;Verbal cues;Nonverbal cues  NWB L UE; L UE in sling  Sit to/from Stand Device  Stedy   Bed to/from Chair  Unable to perform;Not tested  RN in room during session; Therapist about to t/f pt to chair, Pt scheduled for MRI.  Weight Bearing Restriction with Mobility  maintained   Overall Functional Mobility Comments  L UE in sling   Recommendations for IP Admission - 02/09/20 1439    OT Recommendations  for Inpatient Admission  Activity/Level of Assist  out of bed;transfers only;assist of 2;mechanical lift   ADL Recommendations  bedside commode;transfers only;assist of 2;mechanical lift  or bed level  Activities of Daily Living - 02/09/20 1440    Activities of Daily Living  Overall Activities of Daily Living Comments Pt requires assist with all BADLs at baseline   [facility] Daily Activities - 02/09/20 1441     AM-PAC - Daily Activity IP Short Form  Help needed from another person putting on/taking off regular lower body clothing  2 - A Lot   Help needed from another person for bathing (incl. washing, rinsing, drying)  2 - A Lot   Help needed from another person for toileting (incl. using toilet, bedpan, urinal)  2 - A Lot   Help needed from another person putting on/taking off regular upper body clothing  1 - Unable   Help needed from another person taking care of personal grooming such as brushing teeth  2 - A Lot   Help needed from another person eating meals  2 - A Lot   AM-PAC Daily Activity Raw Score (Total of rows above)  11   Clinical Impression/Recommendation - 02/09/20 1442    Clinical Impression / Recommendation  Initial Assessment  Pt is a 86yo RHD male, DNR, from the [facility] w/ PMHx: atrial fibrillation, anxiety, hypertension, diabetes not on medication, cavernous malformation, memory loss/dememtia, hx of COVID, gait disorder, anxiety,  BPH, hypothyroidism admitted after mechanical fall with fracture left humerus. Pt lives at [facility]. Pt has private 24/7 aide at baseline and requires assist with mobility and ADLs w/ RW and w/c. Pt also was receiving home PT. Pt presents supine in bed, resting with eyes close but awake. A & O x self only. Pt unable to rate pain, states he has a lot of pain in L arm. RN aware. ROM and MMT: L UE not tested 2/2 fracture; R UE WFL for age; Pt requires assist with all BADLs at baseline. REq?s total assist for transfers with Stedy/mechanical lift. Recommend pt d/c to ALF with 24/7 assistance and continued PT   Patient Goal  return home;return to prior level of function;reduce pain   OT Frequency  One time only   Reason for Discharge (OT)  no further expectation of functional progress   OT Disposition Recommendation(s)  Home with 24 hour assistance;Return to Assisted Living Facility;Home Physical Therapy   OT Handoff - 02/09/20 1445    Handoff Documentation  Handoff  Patient in bed;Bed alarm;Patient instructed to call nursing for mobility;Discussed with nursing   Handoff Comments  L UE sling in place; RN in room   Meriel Pica OTR/LOccupational TherapistPhysical Medicine DepartmentContact via Mobile [phone]

## 2020-02-09 NOTE — Care Coordination-Inpatient
[facility] Department of Health                                                                                                                                                                                          1RUG II Group (print name):   RHCF Level of Cure    q HRF q SNF                      Hospital and Community  	             Patient Review Instrument (H/C-PRI)                                                                                                                                     Use with separate Hospital and Community [facility] InstructionsI. ADMINISTRATIVE DATA 1. Operating Certificate Number   [id number] 2. Social Security Number    [id number] 3. OFFICIAL NAME OF HOSPITAL OR OTHER AGENCY/FACILITY COMPLETING THIS REVIEW: [facility]4A. PATIENT NAME (AND COMMUNITY ADDRESS IF REVIEWED IN COMMUNITY). Keith Strickland. COUNTY OF RESIDENCE:  [place] 11A. DATE OF HOSPITAL ADMISSION OR INITIAL AGENCY VISIT: 02/09/2020 5. DATE OF PRI COMPLETION:  02/09/20 11B. DATE OF ALTERNATE LEVEL OF CARE STATUS:(if applicable): 6. MRN: [id number] 12. MEDICAID NUMBER: 7. HOSPITAL ROOM NUMBER: [id number] 13. MEDICARE NUMBER:  [id number] 8. NAME OF HOSPITAL UNIT/DIVISION/BUILDING:[facility] 14. PRIMARY PAYOR:        1= Medicaid [   ]        2= Medicare[ x  ] 3= Other[   ]       9. DATE OF BIRTH: [birth year] 15. REASON FOR PRI COMPLETION:     1.RHCF Application from Hospital [ x  ]     2. RCF Application from [employer] [   ]     3. Other (specify):  10.SEX: male  II. MEDICAL EVENTS 16. DECUBITUS LEVEL: ENTER THE MOST SEVERE LEVEL (0-5) AS DEFINED IN THE INSTRUCTIONS:  [   0  ]17. MEDICAL CONDITIONS: DURING THE PAST WEEK.  READ THE INSTRUCTIONS FOR SPECIFIC DEFINITIONS.                                                       1=YES 2=NOA. Comatose 2 B. Dehydration 2 C. Internal Bleeding: 2 D. Stasis Ulcer:  2 E. Terminally Ill: 2 F. Contractures: 2 G. Diabetes Mellitus:      1 H. Urinary Tract Infection: 2 I.   HIV Infection Symptomatic: 2 J. Accident     1 K. Ventilator Dependent: 2  18. MEDICAL TREATMENTS: READ THE INSTRUCTIONS FOR QUALIFIERS.                                                                       1=Yes   2=No                                                                      A. Tracheostomy Care/Suctioning:      (Daily -Exclude self care 2 B. Suctioning- General (Daily) 2 C. Oxygen (Daily) 2 D. Respiratory Care (Daily) 2 E. Nasal Gastric Feeding 2 F. Parenteral Feeding 2 G. Wound Care 2 H. Chemotherapy 2 I.  Transfusion 2 J. Dialysis 2 K. Bowl and Bladder Rehabilitation(SEE INSTRUCTIONS) 2 L. Catheter (Indwelling or External) 2 M. Physical Restraints (Daytime Only) 2  III. ACTIVITIES OF DAILY LIVING (ADLs)  Measure the capability of the patient to perform each ADL 60% or more of the time it is performed during the past week (7 days). Read the instructions for the Changed Condition Rule and the definitions of the ADL terms. 19. EATING: PROCESS OF GETTING FOOD BY ANY MEANS FROM THE RECEPTACLE INTO THE BODY (FOR EXAMPLE, PLATE, CUP TUBE) 19.  3 1=  Feeds self without supervision or physical assistance. May use adaptive equipment.  3. = Requires continual help (encouragement/teaching/physical assistance) with eating or meal will not be completed.  2.=  Required intermittent supervision (that is, verbal encouragement/guidance) and/or minimal physical assistance with minor parts of eating, such as cutting food, buttering bread or opening milk carton. 4.= Totally fed by hand, patient does not manually participate.  5. = Tube or parenteral feeding for primary intake of food. (Not just for supplemental nourishments.) 20. MOBILITY: HOW THE PATIENT MOVES ABOUT. 20.   3 1= Walks with no supervision or human assistance. May require mechanical device (for example, a walker,) but not a wheelchair.  2.= Walks with intermittent supervision (that is, verbal cueing and observation). May require human assistance for difficult parts of walking (for example, stairs, ramps.).  3.= Walks with constant one-to-one supervision and/or constant physical assistance. 4.= Wheels with no supervision or assistance, except for difficult maneuvers (for example, elevators, ramps). May actually be able to walk, but generally does not move. 5.=  Is wheeled, chair fast or bedfast. Relies on someone else to move about, if at all.  21. TRANSFER:  PROCESS OF MOVING BETWEEN POSITIONS, TO/FROM BED CHAIR, STANDING, (EXCLUDE TRANSFERS TO/FROM [facility] AND TOILET). 21.  4 1.= Requires no supervision or physical assistance to complete necessary transfers. May use equipment, such as railings, trapeze. 3.= Requires one person to provide constant guidance, steadiness and/or physical assistance. Patient may participate in transfer. 2.= Requires intermittent supervision (that is, verbal cueing, guidance) and/or physical assistance for difficult maneuvers only. 4.= Requires two people to provide constant supervision and/or physically lift. May need lifting equipment.  5.= Cannot and is not gotten out of bed.  22. TOILETING: PROCESS OF GETTING TO AND FROM A TOILET (OR USE OF OTHER TOILETING EQUIPMENT, SUCH AS BEDPAN). TRANSFERRING ON AND OFF TOILE, CLEANSING SELF AFTER ELIMINATION AND ADJUSTING CLOTHES. 22.   4 1.= Requires no supervision or physical assistance. May require special equipment, such as a raised toilet or grab bars.  3.= Continent of bowel and bladder. Required constant supervision and/or physical assistance with major/all parts of the task, including appliances (i.e., colostomy, ileostomy, urinary catheter. 2. = Requires intermittent supervision for safety or encouragement, or minor physical assistance (for example, clothes adjustment or washing hands.). 4.= Incontinent of bowel and/or bladder, and is not taken to a bathroom.  5.= Incontinent of bowel and/or bladder, but is taken to a bathroom every two to four hours during the day and as needed at night. IV. BEHAVIORS 23. VERBAL DISCRUPTIONS:  BY YELLING, BAITING, THREATENING, ETC. 23.   1 1.= No known history. 4.= Unpredictable, recurring verbal disruption at least once during the past week (7 days) for no foretold reason. 2. Known history or occurrences, but not during the past week (7 days). 5.= Patient is level #4 above, but does not fulfill the active treatment and assessment qualifiers (in the instructions). 3.= Short-lived or predictable disruption regardless of frequency (for example, during specific care routines, such as bathing. )  24.  PHYSICAL AGGRESSION:  assaultive or combative  to self or others with intent for injury. (for example hits self, throws objects, punches, dangerous maneuvers with wheelchair) 24.   1 1.= No known history. 4.= Unpredictable, recurring aggression at least once during the past week (7 days) for no apparent or foretold reason (that is , not just during specific care routines or as a reaction to normal stimuli). 2.- Known history or occurrences, but not during the past week (7 days). 5.= Patient is a t level as #4 above, but does not fulfill the active treatment and assessment qualifiers (in the instructions). 3.= Predictable aggression during specific care routines or as a reaction to normal stimuli (for example, bumped into), regardless of frequency. May strike or fight. 25. DISRUPTIVE, INFANTILE OR SOCIALLY INAPPROPRIATE BEHAVIOR: CHILDISH, REPETITIVE OR ANTISOCIAL PHYSICAL BEHAVIOR WHICH CREATES DISRUPTION WITH OTHERS. (FOR EXAMPLE, CONSTANTLY UNDRESSING SELF, STEALING, SMEARING FECES, SEXUALLY DISPLAYING ONESELF TO TOHERS). EXCLUDE VERBAL ACTIONS. READ THE INSTRUCTIONS FOR OTHER EXCLUSSIONS. 25.   1 1.= No know history 4.= Occurrences of this disruptive behavior at least once during the past week (7 days). 2.= Displays this behavior, but is not disruptive to others (for example, rocking in place). 5.= Patient is at level #4 above, but does not fulfill the active treatment and psychiatric assessment qualifiers (in instructions). 3.= Known history or occurrences, but not during the past week. (7 days).  26. HALLUCINATIONS:  EXPERIENCED AT LEAST ONCE DURING THE PAST WEEK, VISUAL, AUDITORY OR TACTILE PERCEPTIONS THAT HAVE NO BASIS IN EXTERNAL REALITY.  26.    2 1.= Yes  2.= No  3.= Yes, but does not fulfill the active treatment and psychiatric assessment qualifiers (in the instructions). V. SPECIALIZED SERVICES 27. PHYSICAL AND OCCUPATIONAL THERAPIES:  READ INSTRUCTIONS AND QUALIFIERS. EXCLUDE REHABILTATIVE NURSES AND OTHER SPECIALIZED THERAPISTS (FOR EXAMPLE, SPEECH THERAPIST). ENTER THE LEVEL, DAYS AND TIME (HOURS AND MINUTES) DURING THE PAST WEEK (7 DAYS).                       A.  Physical Therapy (P.T.)                       B. Occupational Therapy (O.T.)DAYS AND TIME PER WEEK: ENTER THE CURRENT NUMER OF DAYS AND TIME (HOURS AND MINUTES) DURING THE PAST WEEK (7 DAYS) THAT EACH TEHRAPY WAS PROVIDED. ENTER ZERO IF AT #1 LEVEL ABOVE. READ INSTRUCTIONS AS TO QUALIFIERS IN COUNTING DAYS AND TIME.  27.P.T. Level:3P.T. Days:6P.T. Time:       MIN/Week:O.T. Level:O.T. Days:O.T. Time:      MIN/Week: 28. NUMBER OF PHYSICIAN VISITS: DO NOT ANSWER THIS QUESTION FOR HOSPITALIZED PATIENT, (ENTER ZERO), UNLESS ON ALTERNATE LEVEL OF CARE STATUS. ENTER ONLY THE NUMBER OF VISITS DURING THE PAST WEEK THAT ADHERE TO THE PATIENT NEED AND DOCUMENTATION QUALIFIERS IN THE INSTRUCTIONS. THE PATIENT MUST BE MEDICALLY UNSTABLE TO ENTER ANY PHYSICIAN VISITS. OTHERWISE ENTER A ZERO. 28.  VI. DIAGNOSIS 29. PRIMARY PROBLEM: THE MEDICAL CONDITION REQUIRING THE LARGEST AMOUNT OF NURSING TIME IN THE HOSPITAL OR CARE TIME IF IN THE COMMUNITY. (FOR HOSPITALIZED PATIENTS THIS MAY OR MAY NOT BE THE ADMISSION DIAGNOSIS)                                         ICD-9 Code of medical problem 29.CD-9 Code:If code cannot be located, print medical name here: Closed fracture of proximal end of  left humerus, unspecified fracture morphology, initial encounter VII. PLAN OR CARE SUMMARY   This section is to communicate to providers any additional clinical information which may be needed for their preadmission review of the patient. It does not have to be completed if the information below is already provided by your own form, which is attached to this HC/-PRI. 30. DIAGNOSES AND PROGNOSES: FOR EACH DIAGNOSIS DESCRIBE THE PROGNOSIS AND CARE PLAN IMPLICATIONS. Primary1. Closed fracture of proximal end of left humerus, unspecified fracture morphology, initial encounter Prognosis: Secondary (Include Sensory Impairments)A-fib (HC Code) Anxiety Closed displaced fracture of left femoral neck (HC Code) DM (diabetes mellitus) (HC Code) GERD (gastroesophageal reflux disease) GERD (gastroesophageal reflux disease) Glaucoma HTN (hypertension) Hyperlipidemia Hypertension Hypothyroid Hypothyroidism IBS (irritable bowel syndrome) Kyphosis Lumbosacral radiculopathy Major depression Osteoarthritis Paroxysmal atrial fibrillation (HC Code) Pneumonia Vitamin deficiency   31. REHABLITATION POTENTIAL (INFORMATION FROM THERAPIST(S))       A. POTENTIAL DEGREE OF IMPROVEMENT WITH ADL'S WITHIN SIX MONTH (DESCRIBE IN TERMS OF ADL LEVELS ON THE HC-PRI):                   B. CURRENT THERAPY CARE PLAN : DESCRIBE THE TREATMENTS (INCLUDING WHY) AND ANY SPECIAL EQUIPMENT REQUIRED.           32. MEDICATIONS: Please see attachedName Dose Frequency Route Diagnosis requiring Each Medication 33. TREATMENTS: INCLUDE ALL DRESSINGS, IRRIGATIONS, WOUND CARE, OXYGEN. A. Treatmentsnone Describe Why Needed Frequency B. NARRATIVE: DESCRIBE SPECIAL DIET, ALLERGIES, ABNORMAL LAB VALUES, PACEMAKER. Diet: cardiac 3 gm NaAllergies: sulfa antibiotics, penicillins, Cipro, Avelox, peanuts, glutin protein, lactoseGlucose 110   34. RACE/ETHNIC GROUP: CHOOSE WHICH BEST DECRIBES THE PATIENT'S RACE OR ETHNIC GROUP:1= White   [x  ] 4= Black/Hispanic [  ] 7= American [place] or [place] Native [  ] 2= White/Hispanic  [  ] 5= Asian or Pacific Islander [  ] 8= American [place] or [place] Native/Hispanic [   ] 3= Black [  ] 6= Asian or Pacific Islander/Hispanic [  ] 9= Other [  ]        35. QUALIFIED ASSESSOR: I HAVE PERSONALLY OBSERVED/INTERFIEWED THIS PATIENT AND COMPLETED THIS H/C-PRI : [ x ]YES    [   ]NOI CERTIFY THAT THE INFORMATION CONTAINED HEREIN IS A TRUE ABSTRACT OF THE PATIENT'S CONDITION AND MEDICAL RECORD.SIGNATURE OF THE QUALIFIED ASSESSOR____Electronically Signed by Gwendolyn Lima, RN, April 9, 2021IDENTIFICATION NO.________[id number]

## 2020-02-09 NOTE — H&P
[facility]	 Medicine History & PhysicalHistory provided by the patient and aide.History limited by no limitations.Patient presents from [place]. Subjective: Chief Complaint: FallHPI: 86 year old male with history of pAF (no AC, Hx of ICH, Currently NSR), Anxiety/Depression, GERD, HTN/HLD, BPH, Glaucoma, IBS and Hypothyroidism who presents after mechanical fall at [place]. The patient's main complaints is pain to his left shoulder. The patient denies hip pain, leg pain, head trauma, neck pain, feeling lighthead or dizziness. Per aide his vision is impaired, he walks with walker and his gait is unsteady as a result. The patient denies feeling lightheaded, dizzy, chest pain, or shortness of breath preceding the fall. Denies head trauma and LOC.Medical History: PMH PSH Past Medical History: Diagnosis Date ? A-fib (HC Code)  ? Anxiety  ? Closed displaced fracture of left femoral neck (HC Code)  ? DM (diabetes mellitus) (HC Code)  ? GERD (gastroesophageal reflux disease)  ? GERD (gastroesophageal reflux disease)  ? Glaucoma  ? HTN (hypertension)  ? Hyperlipidemia  ? Hypertension  ? Hypothyroid  ? Hypothyroidism  ? IBS (irritable bowel syndrome)  ? Kyphosis  ? Lumbosacral radiculopathy  ? Major depression  ? Osteoarthritis  ? Paroxysmal atrial fibrillation (HC Code)  ? Pneumonia  ? Vitamin deficiency   Past Surgical History: Procedure Laterality Date ? FRACTURE SURGERY     Family History Family history is unknown by patient. Unable to further update as patient only representative at this time.Social History  reports that he has never smoked. He has never used smokeless tobacco. He reports that he does not drink alcohol or use drugs.Prior to Admission Medications No current facility-administered medications on file prior to encounter.  Current Outpatient Medications on File Prior to Encounter Medication Sig Dispense Refill ? azelastine (OPTIVAR) 0.05 % ophthalmic solution Place 1 drop into both eyes daily.   ? escitalopram oxalate (LEXAPRO) 10 MG tablet Take 10 mg by mouth at bedtime.    ? finasteride (PROSCAR) 5 mg tablet Take 5 mg by mouth daily.   ? levothyroxine (SYNTHROID, LEVOTHROID) 175 MCG tablet Take 175 mcg by mouth every morning.    ? metoprolol (TOPROL-XL) 25 MG 24 hr tablet Take 0.5 tablets (12.5 mg total) by mouth daily. Take with or immediately following a meal. 30 tablet 2 ? OLANZapine (ZYPREXA) 2.5 MG tablet Take 1 tablet (2.5 mg total) by mouth nightly. 30 tablet 0 ? omeprazole (PRILOSEC) 20 mg capsule Take 20 mg by mouth 2 times daily (0900, 1700).    ? pravastatin (PRAVACHOL) 20 MG tablet Take 1 tablet (20 mg total) by mouth daily with dinner. 30 tablet 2 ? tamsulosin (FLOMAX) 0.4 mg 24 hr capsule Take 2 capsules (0.8mg ) by mouth at bedtime.   ? timolol (TIMOPTIC) 0.5 % ophthalmic solution Place 1 drop into both eyes daily.   ? acetaminophen (TYLENOL) 325 MG tablet Take 650 mg by mouth every 6 (six) hours as needed (Pain/Fever).    ? aluminum-magnesium hydroxide-simethicone (MAALOX) 200-200-20 mg/5 mL suspension Take 30 mLs by mouth every 6 (six) hours as needed (GERD).    ? bismuth subsalicylate (PEPTO BISMOL,KAOPECTATE) 262 mg/15 mL suspension Take 30 mLs by mouth every 6 (six) hours as needed (Diarrhea).   ? cholecalciferol, vitamin D3, 125 mcg (5,000 unit) tablet Take 5,000 Units by mouth daily.    ? cimetidine (TAGAMET) 200 mg tablet Take 200 mg by mouth 2 times daily (0900, 1700).   ? guaiFENesin (GERI-TUSSIN) 100 mg/5 mL Liqd Take 200 mg by mouth every 4 (four) hours as  needed for cough.   ? guaiFENesin (MUCINEX) 600 mg 12 hr extended release tablet Take 1 tablet (600mg ) by mouth every other day as needed for Congestion/Allergies.   ? magnesium hydroxide (MILK OF MAGNESIA) 400 mg/5 mL suspension Take 30 mLs by mouth daily as needed for constipation.   ? melatonin 1 mg tablet Take 1 mg by mouth at bedtime.   ? senna (SENNA LAX) 8.6 mg tablet Take 2 tablets (17.2mg ) by mouth nightly.   ? sucralfate (CARAFATE) 100 mg/mL suspension Take 1 g by mouth 4 (four) times daily.   ? travoprost (TRAVATAN Z) 0.004 % ophthalmic solution Place 1 drop into both eyes nightly.    Allergies Allergies Allergen Reactions ? Gluten Protein Other (See Comments)   intolerance ? Lactose Other (See Comments)   intolerance ? Avelox [Moxifloxacin]  ? Ciprofloxacin  ? Peanut  ? Penicillins  ? Sulfa (Sulfonamide Antibiotics)   Review of Systems: Review of Systems Constitutional: Positive for activity change. Negative for appetite change, chills, fatigue and fever.      Fall HENT: Negative.  Eyes: Negative.  Respiratory: Negative.  Cardiovascular: Negative.  Gastrointestinal: Negative.  Endocrine: Negative.  Genitourinary: Negative.  Musculoskeletal: Positive for arthralgias and gait problem. Negative for joint swelling and myalgias.      Left Shoulder Pain Skin: Negative.  Allergic/Immunologic: Negative.  Neurological: Negative for dizziness, syncope, facial asymmetry, speech difficulty, weakness and light-headedness. Hematological: Negative.  Psychiatric/Behavioral: Negative.   Objective: Vitals:Vitals Min & Max: BP  Min: 176/76  Max: 193/85Temp  Avg: 98.4 ?F (36.9 ?C)  Min: 98.4 ?F (36.9 ?C)  Max: 98.4 ?F (36.9 ?C)Pulse  Avg: 56.5  Min: 54  Max: 59Resp  Avg: 23  Min: 20  Max: 26SpO2  Avg: 97 %  Min: 97 %  Max: 97 %Weight  Avg: 71.8 kg  Min: 71.8 kg  Max: 71.8 kgPhysical Exam: Physical Exam Constitutional: He appears well-developed and well-nourished. No distress. HENT: Head: Normocephalic and atraumatic. Eyes: Pupils are equal, round, and reactive to light. Conjunctivae and EOM are normal. Neck: Normal range of motion. Neck supple. No JVD present. Cardiovascular: Normal rate and regular rhythm. No murmur heard.Pulmonary/Chest: Effort normal and breath sounds normal. No respiratory distress. He has no wheezes. Abdominal: Soft. Bowel sounds are normal. He exhibits no distension. Musculoskeletal:    Comments: Left Humerus Tender, In Sling Neurological: He is alert. No cranial nerve deficit. Skin: Skin is warm and dry. No erythema. Psychiatric: He has a normal mood and affect. His behavior is normal.  Labs: Last 24 hours: Recent Results (from the past 24 hour(s)) EKG  Collection Time: 02/09/20  1:23 AM Result Value Ref Range  Heart Rate 54 bpm  QRS Duration 132 ms  Q-T Interval 474 ms  QTC Calculation(Bezet) 449 ms  P Axis 69 deg  R Axis -76 deg  T Axis 37 deg  P-R Interval 366 msec  ECG - SEVERITY Abnormal ECG severity Troponin I  Collection Time: 02/09/20  1:28 AM Result Value Ref Range  Troponin I 0.015 0.000 - 1.500 ng/mL CBC auto differential  Collection Time: 02/09/20  1:28 AM Result Value Ref Range  WBC 10.2 3.8 - 10.6 x1000/?L  RBC 4.3 (L) 4.6 - 6.1 M/?L  Hemoglobin 13.3 (L) 13.7 - 18.0 g/dL  Hematocrit 16.1 (L) 09.6 - 54.0 %  MCV 93.5 80.0 - 99.0 fL  MCHC 32.8 32.0 - 36.0 g/dL  RDW-CV 04.5 40.9 - 81.1 %  Platelets 182 140 - 446 x1000/?L  MPV 9.8 9.8 -  12.3 fL  ANC (Abs Neutrophil Count) 7.1 1.8 - 7.3 x 1000/?L  Neutrophils 69.9 38.0 - 74.0 %  Lymphocytes 17.5 14.0 - 43.0 %  Absolute Lymphocyte Count 1.8 1.0 - 2.3 x 1000/?L  Monocytes 8.5 0.0 - 14.0 %  Monocyte Absolute Count 0.9 0.4 - 1.3 x 1000/?L  Eosinophils 3.2 0.0 - 6.0 %  Eosinophil Absolute Count 0.3 0.0 - 0.4 x 1000/?L  Basophil 0.6 0.0 - 2.0 %  Basophil Absolute Count 0.1 0.0 - 0.6 x 1000/?L  Immature Granulocytes 0.3 0.0 - 2.0 %  Absolute Immature Granulocyte Count 0.0 0.0 - 0.2 x 1000/?L  nRBC 0.0 0.0 - 0.2 %  Absolute nRBC 0.0 <=0.0 x 1000/?L  MCH 30.7 25.7 - 31.0 pg Basic metabolic panel  Collection Time: 02/09/20  1:28 AM Result Value Ref Range  Sodium 142 136 - 145 mmol/L  Potassium 4.5 3.5 - 5.1 mmol/L  Chloride 109 95 - 115 mmol/L  CO2 25 21 - 32 mmol/L  Anion Gap 8 5 - 18  Glucose 110 (H) 70 - 100 mg/dL  BUN 23 8 - 25 mg/dL  Creatinine 0.98 1.19 - 1.30 mg/dL  Calcium 8.5 8.4 - 14.7 mg/dL  BUN/Creatinine Ratio 82.9 8.0 - 25.0  Osmolality Calculation 287 275 - 295 mOsm/kg  eGFR (Afr Amer) >60 >60 mL/min/1.52m2  eGFR (NON African-American) >60 >60 mL/min/1.60m2 SARS CoV-2 (COVID-19) RNA - [facility])  Collection Time: 02/09/20  1:29 AM  Specimen: Nasopharynx; Viral Result Value Ref Range  SARS-CoV-2 RNA (COVID-19) Negative Negative UA with culture reflex  Collection Time: 02/09/20  2:35 AM  Specimen: Urine Result Value Ref Range  Clarity, UA Clear Clear  Color, UA Yellow Yellow  Specific Gravity, UA 1.015 1.005 - 1.030  pH, UA 6.0 5.5 - 7.5  Protein, UA Negative Negative-Trace  Glucose, UA Negative Negative  Ketones, UA Negative Negative  Blood, UA Negative Negative  Bilirubin, UA Negative Negative  Leukocytes, UA Negative Negative  Nitrite, UA Negative Negative  Urobilinogen, UA <2.0 <=2.0 EU/dL Diagnostics:Radiology results are pending.ECG/Tele Events: I have reviewed the patient's ECG as resulted in the EMR. NSR, Brady 55, No significant ST or T-wave Changes. No significant change from prior study 04/2019.Assessment: 86 year old male with history of pAF (no AC, Currently NSR), Anxiety/Depression, GERD, HTN/HLD, BPH, Glaucoma, IBS and Hypothyroidism who presents after mechanical fall at [place]. Given baseline decreased ambulation requiring walker and now with left arm requiring sling, will admit for PT evaluation and safe discharge planning with goals of symptomatic/pain management.Principal Problem:  Closed fracture of proximal end of left humerus, unspecified fracture morphology, initial encounter  SNOMED [place](R): CLOSED FRACTURE OF PROXIMAL LEFT HUMERUS  Active Problems:  HTN (hypertension)  SNOMED [place](R): HYPERTENSIVE DISORDER    GERD (gastroesophageal reflux disease)  SNOMED [place](R): GASTROESOPHAGEAL REFLUX DISEASE    Essential hypertension  SNOMED [place](R): ESSENTIAL HYPERTENSION    Hypothyroidism  SNOMED [place](R): HYPOTHYROIDISM    HLD (hyperlipidemia)  SNOMED [place](R): HYPERLIPIDEMIA  Plan: # Fall with Humerus Fracture, Hand Fracture - No Obvious Surgical Intervention based on ED Imaging	- FU Official Reads of imaging	- Known with Humerus and Left Hand Fracture- PT Evaluation- Tylenol/Tramadol/Oxycodone PRN Pain# Hx of HTN/HLD -Continue PTA Metoprolol (ER 12.5 mg qDay) and Pravastatin (Sub Crestor)# Hx of Hypothyroidism - - Continue PTA Synthroid (175 mcg qDay)# Hx of Mood Disorder -- Continue PTA Lexapro (10 mg qDay) and Olanzapine (2.5 mg qHS)# Hx of GERD -- Continue PTA Omeprazole (Sub Pantoprazole)# Hx of BPH --  Continue PTA Finasteride (5 mg qDay) and Tamsulosin# Hx of Glaucoma -- Continue PTA Azelastine (Sub Ketotifen) and TimololCode: No CodeObtained from Advanced Directives scanned in EMR on 3/27/2020DVT: SCD for nowDiet: CardiacNotifications: PCP: Cherrie Distance [phone]    I have personally notified the patient's primary care provider of this admission. No  Family was notified of this admission. No Daughter called by ED, no return of call yet.I have personally discussed the plan with the patient and/or family. YesSigned:Suhail Peloquin Katrinka Blazing, MD Beeper: [id number] AMAddendum: None

## 2020-02-09 NOTE — ED Notes
4:07 AM SBAR HandoffSituation:	Admitting Diagnosis: The primary encounter diagnosis was Closed fracture of proximal end of left humerus, unspecified fracture morphology, initial encounter. Diagnoses of Hand fracture, left, closed, initial encounter and Fall, initial encounter were also pertinent to this visit.Background:  Chief Complaint Patient presents with ? Fall >65   [facility]/p mechanical fall while going to the bathroom.  aide was present. denies LOC, head strike. Pt presents alert, c/o left shoulder and left hip pain. ? Shoulder Pain ? Hip Pain Isolation status: Not applicableAllergies:Allergies as of 02/09/2020 - Review Complete 02/09/2020 Allergen Reaction Noted ? Gluten protein Other (See Comments) 10/29/2013 ? Lactose Other (See Comments) 10/29/2013 ? Avelox [moxifloxacin]  02/16/2016 ? Ciprofloxacin  11/10/2018 ? Peanut  02/16/2016 ? Penicillins  10/28/2013 ? Sulfa (sulfonamide antibiotics)  10/28/2013 Code status: FULL CODEAssessment:Vital signs:	Vitals:  02/09/20 0122 02/09/20 0223 BP: (!) 193/85 (!) 176/76 Pulse: (!) 54 (!) 59 Resp: (!) 26 20 Temp: 98.4 ?F (36.9 ?C)  TempSrc: Oral  SpO2: 97% 97% Weight: 71.8 kg  Medications ordered and administered in the Emergency Department:	Medications morphine syringe 2 mg (2 mg IV Push Given 02/09/20 0224) Labs ordered and resulted in the Emergency Department.	Results for orders placed or performed during the hospital encounter of 02/09/20 SARS CoV-2 (COVID-19) RNA - [facility] ([facility])  Specimen: Nasopharynx; Viral Result Value Ref Range  SARS-CoV-2 RNA (COVID-19) Negative Negative Troponin I Result Value Ref Range  Troponin I 0.015 0.000 - 1.500 ng/mL CBC auto differential Result Value Ref Range  WBC 10.2 3.8 - 10.6 x1000/?L  RBC 4.3 (L) 4.6 - 6.1 M/?L  Hemoglobin 13.3 (L) 13.7 - 18.0 g/dL  Hematocrit 16.1 (L) 09.6 - 54.0 %  MCV 93.5 80.0 - 99.0 fL  MCHC 32.8 32.0 - 36.0 g/dL  RDW-CV 04.5 40.9 - 81.1 %  Platelets 182 140 - 446 x1000/?L  MPV 9.8 9.8 - 12.3 fL  ANC (Abs Neutrophil Count) 7.1 1.8 - 7.3 x 1000/?L  Neutrophils 69.9 38.0 - 74.0 %  Lymphocytes 17.5 14.0 - 43.0 %  Absolute Lymphocyte Count 1.8 1.0 - 2.3 x 1000/?L  Monocytes 8.5 0.0 - 14.0 %  Monocyte Absolute Count 0.9 0.4 - 1.3 x 1000/?L  Eosinophils 3.2 0.0 - 6.0 %  Eosinophil Absolute Count 0.3 0.0 - 0.4 x 1000/?L  Basophil 0.6 0.0 - 2.0 %  Basophil Absolute Count 0.1 0.0 - 0.6 x 1000/?L  Immature Granulocytes 0.3 0.0 - 2.0 %  Absolute Immature Granulocyte Count 0.0 0.0 - 0.2 x 1000/?L  nRBC 0.0 0.0 - 0.2 %  Absolute nRBC 0.0 <=0.0 x 1000/?L  MCH 30.7 25.7 - 31.0 pg Basic metabolic panel Result Value Ref Range  Sodium 142 136 - 145 mmol/L  Potassium 4.5 3.5 - 5.1 mmol/L  Chloride 109 95 - 115 mmol/L  CO2 25 21 - 32 mmol/L  Anion Gap 8 5 - 18  Glucose 110 (H) 70 - 100 mg/dL  BUN 23 8 - 25 mg/dL  Creatinine 9.14 7.82 - 1.30 mg/dL  Calcium 8.5 8.4 - 95.6 mg/dL  BUN/Creatinine Ratio 21.3 8.0 - 25.0  Osmolality Calculation 287 275 - 295 mOsm/kg  eGFR (Afr Amer) >60 >60 mL/min/1.54m2  eGFR (NON African-American) >60 >60 mL/min/1.83m2 UA with culture reflex  Specimen: Urine Result Value Ref Range  Clarity, UA Clear Clear  Color, UA Yellow Yellow  Specific Gravity, UA 1.015 1.005 - 1.030  pH, UA 6.0 5.5 - 7.5  Protein, UA Negative Negative-Trace  Glucose, UA Negative Negative  Ketones, UA Negative Negative  Blood, UA Negative Negative  Bilirubin, UA Negative Negative  Leukocytes, UA Negative Negative  Nitrite, UA Negative Negative  Urobilinogen, UA <2.0 <=2.0 EU/dL EKG Result Value Ref Range  Heart Rate 54 bpm  QRS Duration 132 ms  Q-T Interval 474 ms  QTC Calculation(Bezet) 449 ms  P Axis 69 deg  R Axis -76 deg  T Axis 37 deg  P-R Interval 366 msec  ECG - SEVERITY Abnormal ECG severity Radiology studies done in ER: 	XR Shoulder Left 1 View ED Interpretation (+) displaced proximal humerus fracture    Hand 3V Left ED Interpretation  XR Chest PA or AP ED Interpretation No focal infiltrate  Hip 2V Left ED Interpretation Prosthesis intact. No fracture seen  [place] Head wo IV Contrast    (Results Pending) [place] Chest wo IV Contrast    (Results Pending) [place] Pelvis wo IV Contrast    (Results Pending) [place] Pelvis wo IV Contrast    (Results Pending) IV access:	 forearm right, condition patent, no redness and DDI		Mental status: CooperativeFunctional Status: Needs Assistance: fall riskCurrent pain level: 4Dysphagia screen performed: NoIs patient a fall risk: YesIs patient on oxygen: NoSitter ordered/needed? NoRecommendations:		Outstanding tests: No	Consults: No	Pending labs/meds/blood products: No	Brief plan/summary: Pt BIBA from [facility] for eval s/p mechanical fall. Hx of dementia. Per health aide, pt got out of bed while she was in restroom and she heard crash, pt was found by her lying on left side. Pt reports he was getting up to go to BR and fell down. Presented alert, oriented to self and place, calm and cooperative. C/o left shoulder pain, left hand pain, and minor left hip pain. Imaging c/w left shoulder fracture, left hand fracture. Given morphine as ordered for pain with good effect. To be admitted for pain management, PT eval.

## 2020-02-10 LAB — CBC WITH AUTO DIFFERENTIAL
BKR WAM ABSOLUTE IMMATURE GRANULOCYTES: 0 x 1000/ÂµL (ref 0.0–0.2)
BKR WAM ABSOLUTE LYMPHOCYTE COUNT: 1.9 x 1000/ÂµL (ref 1.0–2.3)
BKR WAM ABSOLUTE NRBC: 0 x 1000/ÂµL (ref <=0.0)
BKR WAM ANALYZER ANC: 5 x 1000/ÂµL (ref 1.8–7.3)
BKR WAM BASOPHIL ABSOLUTE COUNT: 0 x 1000/ÂµL (ref 0.0–0.6)
BKR WAM BASOPHILS: 0.5 % (ref 0.0–2.0)
BKR WAM EOSINOPHIL ABSOLUTE COUNT: 0.2 x 1000/ÂµL (ref 0.0–0.4)
BKR WAM EOSINOPHILS: 2 % (ref 0.0–6.0)
BKR WAM HEMATOCRIT: 40.4 % — ABNORMAL LOW (ref 41.0–54.0)
BKR WAM HEMOGLOBIN: 13.1 g/dL — ABNORMAL LOW (ref 13.7–18.0)
BKR WAM IMMATURE GRANULOCYTES: 0.3 % (ref 0.0–2.0)
BKR WAM LYMPHOCYTES: 23.6 % (ref 14.0–43.0)
BKR WAM MCH (PG): 30.2 pg (ref 25.7–31.0)
BKR WAM MCHC: 32.4 g/dL (ref 32.0–36.0)
BKR WAM MCV: 93.1 fL (ref 80.0–99.0)
BKR WAM MONOCYTE ABSOLUTE COUNT: 0.9 x 1000/ÂµL (ref 0.4–1.3)
BKR WAM MONOCYTES: 11.2 % (ref 0.0–14.0)
BKR WAM MPV: 10.2 fL (ref 9.8–12.3)
BKR WAM NEUTROPHILS: 62.4 % (ref 38.0–74.0)
BKR WAM NUCLEATED RED BLOOD CELLS: 0 % (ref 0.0–0.2)
BKR WAM PLATELETS: 165 x1000/ÂµL (ref 140–446)
BKR WAM RDW-CV: 12.1 % (ref 11.5–14.5)
BKR WAM RED BLOOD CELL COUNT: 4.3 M/ÂµL — ABNORMAL LOW (ref 4.6–6.1)
BKR WAM WHITE BLOOD CELL COUNT: 7.9 x1000/ÂµL (ref 3.8–10.6)

## 2020-02-10 LAB — BASIC METABOLIC PANEL
BKR ANION GAP: 6 (ref 5–18)
BKR BLOOD UREA NITROGEN: 19 mg/dL (ref 8–25)
BKR BUN / CREAT RATIO: 20.2 (ref 8.0–25.0)
BKR CALCIUM: 8.5 mg/dL (ref 8.4–10.3)
BKR CHLORIDE: 104 mmol/L (ref 95–115)
BKR CO2: 30 mmol/L (ref 21–32)
BKR CREATININE: 0.94 mg/dL (ref 0.50–1.30)
BKR EGFR (AFR AMER): >60 mL/min/{1.73_m2} (ref >60)
BKR EGFR (NON AFRICAN AMERICAN): >60 mL/min/{1.73_m2} (ref >60)
BKR GLUCOSE: 91 mg/dL (ref 70–100)
BKR OSMOLALITY CALCULATION: 281 mosm/kg (ref 275–295)
BKR POTASSIUM: 4.2 mmol/L (ref 3.5–5.1)
BKR SODIUM: 140 mmol/L (ref 136–145)

## 2020-02-10 NOTE — Plan of Care
Plan of Care Overview/ Patient Status    Problem: Adult Inpatient Plan of CareGoal: Readiness for Transition of CareOutcome: Initial problem identification 86 year old male with history of pAF (no AC, Hx of ICH, Currently NSR), Anxiety/Depression, GERD, HTN/HLD, BPH, Glaucoma, IBS and Hypothyroidism who presents after mechanical fall at [place]. The patient's main complaints is pain to his left shoulder. The patient denies hip pain, leg pain, head trauma, neck pain, feeling lighthead or dizziness. Per aide his vision is impaired, he walks with walker and his gait is unsteady as a result. The patient denies feeling lightheaded, dizzy, chest pain, or shortness of breath preceding the fall. Given baseline decreased ambulation requiring walker and now with left arm requiring sling, will admit for PT evaluation and safe discharge planning with goals of symptomatic/pain management.CM called [name], daughter of patient for initial assessment and explained role of case management.  Patient lives at the [facility] facility.  He has private pay 24/7 help as well.  She says she was told the aide went to the bathroom and that is when he fell. His daughter states it is over a year since he fell.  Per daughter, he has a walker and w/c.  He is often in the wheel chair.  She states he has serious anxiety issues.  She understands the nurse at Assisted Living is pushing for patient to go to the [facility].  She would prefer patient return to ALF if possible.  Patient is pending PT and OT evaluations.  CM sent messages to PT and OT relaying patient current living situation and asking for their input.   She agreed that CM will place referral in Epic to [facility] but only as back up.  Patient gets PT once a week at ALF.CM called [facility] at [phone] and spoke with Zoila.  This is the best phone #.  CM relayed family preference for patient to return to ALF but [facility] is back up. It would be Monday 4/12 to have 3 MN qualifying stay.  Zoila asking that CM follow up on discharge plans over weekend. AddendumPT and OT recommend that patient can return to assisted living with private 24/7 help.   CM advised daughter [name].  We will follow day to day for when he is ready for discharge.  CM called [facility] at [phone] and spoke with Toniann Fail.  She seemed surprised and thought patient was going to go to rehab @ [facility].  CM explained that this writer clearly told Zoila RN that [facility] was only back up plan.  Patient cannot really do much rehab with a broken arm. Case Management Assessment    Most Recent Value Case Management Assessment Do you have a caregiver?  Yes Permission to Speak to Caregiver?  Yes Caregiver name:  Demetrio Lapping - daughter Caregiver phone number:  [phone] Patient Requires Care Coordination Intervention Due To  discharge planning needs/concerns Arrived from prior to admission  assisted living facility Admitted from:  [facility] Bed Hold   n/a Services Prior to Admission  private pay help ADL Assistance  Ambulation, Bathing, Clothing, Eating, Toileting Ambulation assistance  Requires Assistance Bathing  Assistance  Requires Assistance Clothing  Assistance  Requires Assistance Eating  Assistance  Requires Assistance Toileting  Assistance  Requires Assistance Type of Home Care Services  None What equipment do you currently use at home?  walker, rolling, shower chair, wheelchair, manual Documented Insurance Accurate  Yes Any financial concerns related to anticipated discharge needs  No Patient's home address verified  Yes Patient's PCP of record verified  Yes Last Date Seen by PCP  3-6 months Living Environment  Lives With  Facility Resident Current Living Arrangements  independent/assisted living facility Home Accessibility  no concerns Transportation Available  Zenaida Niece, wheelchair accessible Home Safety Feels Safe Living In Home  unable to assess Source of Clinical History Patient's clinical history has been reviewed and source of Information is:  Child(ren) CM/SW Attestation: Choose which ONE is appropriate for you I have reviewed the medical record and completed the above evaluation with the following recommendations.  Yes Discharge Planning Coordination Recommendations Discharge Planning Coordination Recommendations  Needs not determined at this time RN reviewed plan of care/ continuum of care need's with   Family  [place] T. Gar Gibbon, BS, MA, CCM, RN Nurse Case ManagerGreenwich [phone] office phone / confidential [phone] mobile heartbeat phone

## 2020-02-10 NOTE — Plan of Care
Problem: Physical Therapy GoalsGoal: Physical Therapy GoalsDescription: PT GOALS1. Patient will perform bed mobility with minimal assist2. Patient will perform transfers with minimal assist using appropriate assistive device Outcome: RevisedInpatient Physical Therapy Re-EvaluationHPI/Precautions - 02/10/20 1303    Date of Visit / Treatment  Date of Visit / Treatment  02/10/20   Note Type  Re-Evaluation   Total Treatment Time  20    General Information  Pertinent History Of Current Problem  Pt is a 86 y/o M admitted to [facility] s/p mechanical fall resulting in humeral fracture, now NWB on LUE.   Subjective  I don't feel well.   Referring Physician   MUTIC   General Observations  Pt found seated at EOB with nursing staff, sling in place to LUE, appears lethargic and keeping eyes closed, cooperative with PT.   Precautions/Limitations  fall precautions;bed alarm;chair alarm   Fall History  yes, reason for admission    Weight Bearing Status  Weight Bearing Status  Ex - Within normal limits except   LUE Weight-Bearing Status  nonweight-bearing    Social History - 02/10/20 1313    Prior Level of Functioning/Social History  Prior Level of Function  assist with mobility;assist with ADLs   Patient resides with:  Facility Resident   Type of Home  Assisted living   Equipment Utilized Prior to [employer] walker  wheelchair  Additional Comments  Pt lives at [facility] with 24/7 aide, requires assistance with ADL's and with mobility with RW and wheelchair   Vitals/Pain - 02/10/20 1314    Vital Signs and Orthostatic Vital Signs  Vital Signs  Vital Signs Stable    Pain/Comfort  Pain Comment (Pre/Post Treatment Pain)  Reports of pain to L shoulder with mobility    Patient Coping  Observed Emotional State  accepting   Verbalized Emotional State  acceptance   Cognition/Vision - 02/10/20 1315 Cognition  Overall Cognitive Status  Impaired   Level of Consciousness  lethargic   Following Commands  Follows one step commands with repetition;Follows one step commands with increased time   Personal Safety / Judgment  Fall risk    MMT/Musculo - 02/10/20 1315    Musculoskeletal  LLE Muscle Strength Grading  3-->active movement against gravity   RLE Muscle Strength Grading  3-->active movement against gravity      Balance - 02/10/20 1315    Balance  Sitting Balance: Static   FAIR       Maintains static position without assist or device, may require Supervision or Verbal Cues (<2 minutes)   Sitting Balance: Dynamic   FAIR-     Performs dynamic activities through partial range (50-75%) with Contact Guard   Standing Balance: Static  POOR      Moderate assist to maintain static position with no Assistive Device   Standing Balance: Dynamic   POOR     Moves through 1/4 to 1/2 ROM range with moderate assist to right self   Balance Assist Device  Stedy     Mobility - 02/10/20 1315    Mobility  Mobility Detailed Documentation  Bed-Chair Transfer    Sit-Stand Transfer Training  Symptoms Noted During/After Treatment (Sit-to-Stand Transfer Training)  fatigue   Sit-to-Stand Transfer Independence/Assistance Level  Minimum assist;Assist of 2;Verbal cues   Sit-to-Stand Transfer Assist Device  Stedy   Stand-to-Sit Transfer Independence/Assistance Level  Minimum assist;Assist of 2;Verbal cues   Stand-to-Sit Transfer Assist Device  [facility]   Transfer Safety Analysis Concerns  decreased weight-shifting ability;cues for hand placement;decreased  sequencing ability   Transfer Safety Analysis Impairments  impaired balance;decreased strength;pain   Transfers were limited by:  pain;cognition   Sit-Stand Transfer Comments  Attempted transfer with HHA, very retropulsive and unable to maintain balance with minA x1; required use of STEDY to complete transfer with assist x2 for safety    Bed-Chair Transfer Training  Bed-to-Chair Transfer Independence/Assistance Level  Total assist/dependent;Assist of 2   Bed-to-Chair Transfer Assist Device  Stedy   Bed-Chair Transfer Comments  Unable to ambulate at this time, use of STEDY to transfer to chair    Gait Training  Independence/Assistance Level   Unable to perform   Gait Training Comments  Not able to complete at this time   PT Handoff - 02/10/20 1319    Handoff Documentation  Handoff  Patient in chair;Chair alarm;Patient instructed to call nursing for mobility;Discussed with nursing   AMPAC Basic Mobility - 02/10/20 1319    AM-PAC - Basic Mobility Screen- How much help from another person do you currently need.....  Turning from your back to your side while in a a flat bed without using rails?  2 - A Lot - Requires a lot of help (maximum to moderate assistance). Can use assistive devices.   Moving from lying on your back to sitting on the side of a flat bed without using bed rails?  2 - A Lot - Requires a lot of help (maximum to moderate assistance). Can use assistive devices.   Moving to and from a bed to a chair (including a wheelchair)?  1 - Total - Requires total assistance or cannot do it at all.   Standing up from a chair using your arms(e.g., wheelchair or bedside chair)?  1 - Total - Requires total assistance or cannot do it at all.   To walk in a hospital room?  1 - Total - Requires total assistance or cannot do it at all.   Climbing 3-5 steps with a railing?  1 - Total - Requires total assistance or cannot do it at all.   AMPAC Mobility Score  8   Highest Level of Mobility TARGET  Mobility Level 3,Sit on edge of bed          Clinical Impression - 02/10/20 1321    Clinical Impression  Rehab Diagnosis  impaired functional mobility   Follow up Assessment  Pt is a 86 y/o M admitted to [facility] s/p mechanical fall resulting in L humeral fracture, now NWB on LUE. Pt presents with decreased strength, impaired balance, impaired cognition, and increased pain limiting overall functional mobility. Attempted to complete STS transfer with HHA x1, pt very retropulsive and unable to maintain balance, unsafe to attempt ambulation at this time. Pt required use of STEDY and assist x2 to safelly transfer from bed to chair. Pt would benefit from continued PT to addres functional deficits. Recommend D/C to STR when medically stable to maximize functional gains.   Rehab Potential  fair, will monitor progress closely   Patient/Family Stated Goals - 02/10/20 1330    Patient/Family Stated Goals  Patient/Family Stated Goal(s)  feel better   Frequency/Equipment Recommendations - 02/10/20 1330    Frequency/Equipment Recommendations  PT Frequency  Daily   Equipment Needs During Admission/Treatment  --  STEDY  Recommendations for IP Admission - 02/10/20 1330    PT Recommendations for Inpatient Admission  Activity/Level of Assist  out of bed;transfers only;assist of 2  with STEDY  ADL Recommendations  bedside commode;assist of 2  with STEDY  Positioning  reposition frequently   Other/Comments  OOB to chair daily assist x2   Planned Treatment/Interventions - 02/10/20 1331    Planned Treatment / Interventions  Plan for Next Visit  progress functional transfers   General Treatment / Interventions  Aerobic Capacity / Endurance Conditioning;Discharge Planning;Therapeutic Exercise   Training Treatment / Interventions  Balance / Engineer, production;Endurance Training;Functional Mobility Training   Education Treatment / Interventions  Patient Education / Training   Discharge Summary - 02/10/20 1331    PT Discharge Summary  PT Disposition Recommendation(s)  STR (Short Term Rehab at a SNF rehab unit)   Equipment Recommendations for Discharge Patient has all necessary durable medical equipment   Drinda Butts, PT, DPT Physical MedicineGreenwich Southfield Endoscopy Asc LLC Heartbeat: [phone]

## 2020-02-10 NOTE — Progress Notes
[facility] Medicine Progress NoteAttending Provider: Marshell Levan, MD Subjective                                                                              Subjective: Interim History: reports some soreness in LUE; no other complaints; sleepy but was arousable this am and was answering simple questions Review of Allergies/Meds/Hx: Review of Allergies/Meds/Hx:I have reviewed the patient's: allergies and current scheduled medications Objective Objective: Vitals:Last 24 hours: Temp:  [98.1 ?F (36.7 ?C)-98.4 ?F (36.9 ?C)] 98.1 ?F (36.7 ?C)Pulse:  [59-72] 66Resp:  [16-20] 16BP: (100-162)/(59-79) 100/59SpO2:  [95 %-96 %] 96 %I/O's:Gross Totals (Last 24 hours) at 02/10/2020 1209Last data filed at 02/10/2020 0645Intake -- Output 1250 ml Net -1250 ml Procedures:Physical Exam Constitutional: No distress. HENT: Head: Normocephalic and atraumatic. Eyes: Pupils are equal, round, and reactive to light. EOM are normal. Neck: Normal range of motion. Neck supple. No JVD present. Cardiovascular: irregular Pulmonary/Chest: Effort normal and breath sounds normal. Abdominal: Soft. Bowel sounds are normal. Musculoskeletal:    Comments: LUE in sling Neurological: No cranial nerve deficit. Alert, clear sensorium; answering simple questions appropriately; has poor vision and is HOH as well; moving all extremities Psychiatric: He has a normal mood and affect. His behavior is normal. Labs:Last 24 hours: Recent Results (from the past 24 hour(s)) Basic metabolic panel  Collection Time: 02/10/20  5:42 AM Result Value Ref Range  Sodium 140 136 - 145 mmol/L  Potassium 4.2 3.5 - 5.1 mmol/L  Chloride 104 95 - 115 mmol/L  CO2 30 21 - 32 mmol/L  Anion Gap 6 5 - 18  Glucose 91 70 - 100 mg/dL  BUN 19 8 - 25 mg/dL  Creatinine 6.44 0.34 - 1.30 mg/dL  Calcium 8.5 8.4 - 74.2 mg/dL  BUN/Creatinine Ratio 59.5 8.0 - 25.0  Osmolality Calculation 281 275 - 295 mOsm/kg  eGFR (Afr Amer) >60 >60 mL/min/1.24m2  eGFR (NON African-American) >60 >60 mL/min/1.76m2 CBC auto differential  Collection Time: 02/10/20  5:42 AM Result Value Ref Range  WBC 7.9 3.8 - 10.6 x1000/?L  RBC 4.3 (L) 4.6 - 6.1 M/?L  Hemoglobin 13.1 (L) 13.7 - 18.0 g/dL  Hematocrit 63.8 (L) 75.6 - 54.0 %  MCV 93.1 80.0 - 99.0 fL  MCHC 32.4 32.0 - 36.0 g/dL  RDW-CV 43.3 29.5 - 18.8 %  Platelets 165 140 - 446 x1000/?L  MPV 10.2 9.8 - 12.3 fL  ANC (Abs Neutrophil Count) 5.0 1.8 - 7.3 x 1000/?L  Neutrophils 62.4 38.0 - 74.0 %  Lymphocytes 23.6 14.0 - 43.0 %  Absolute Lymphocyte Count 1.9 1.0 - 2.3 x 1000/?L  Monocytes 11.2 0.0 - 14.0 %  Monocyte Absolute Count 0.9 0.4 - 1.3 x 1000/?L  Eosinophils 2.0 0.0 - 6.0 %  Eosinophil Absolute Count 0.2 0.0 - 0.4 x 1000/?L  Basophil 0.5 0.0 - 2.0 %  Basophil Absolute Count 0.0 0.0 - 0.6 x 1000/?L  Immature Granulocytes 0.3 0.0 - 2.0 %  Absolute Immature Granulocyte Count 0.0 0.0 - 0.2 x 1000/?L  nRBC 0.0 0.0 - 0.2 %  Absolute nRBC 0.0 <=0.0 x 1000/?L  MCH 30.2 25.7 - 31.0 pg Diagnostics:Wilson pelvisThe patient is status post left hip arthroplasty. There is no radiographic  evidence of hardware complication or failure. There is an age-indeterminate L3 compression fracture with lytic components. There is 4 mm of retropulsion resulting in severe spinal canal stenosis. There is a nonaggressive sclerotic density within the right sacrum. The visualized pelvic viscera are unremarkable. There is no bowel obstruction, free fluid, or free air. There is no pelvic adenopathy.  Impression: ? IMPRESSION: Age-indeterminate L3 compression fracture with lytic components and 4 mm of retropulsion resulting in severe canal stenosis. This raises concern for pathological fracture.  Left shoulder xray:FINDINGS: There is a fracture of the surgical neck of the left humerus with some impaction and medial displacement not fully assessed on the single view.  Impression: ? IMPRESSION: Proximal left humeral fracture.   Assessment Assessment: 86yo male, DNR, from the [facility] (assisted living) w/ atrial fibrillation, hypertension, diabetes?not on medication, cavernous malformation, memory loss, hx of COVID, gait disorder, anxiety,  BPH, hypothyroidism admitted after mechanical fall with fracture left humerus Plan Plan: # Fall with Humerus Fracture - no weight bearing on LUE; arm sling; follow with ortho in 2 weeks with repeat xray ( Dr Alfredo Batty or Dr Lyman Bishop) - PT/OT as tolerated- scheduled Tylenol; prn Tramadol  PRN for breakthrough pain?# Hx of HTN/HLD -Continue PTA Metoprolol (ER 12.5 mg qDay) and Pravastatin (Sub Crestor)?# Hx of Hypothyroidism - - Continue PTA Synthroid (175 mcg qDay)?# Hx of Mood Disorder - Olanzapine (2.5 mg qHS)?# Hx of GERD -- Continue PTA Omeprazole (Sub Pantoprazole)?# Hx of BPH -- Continue PTA Finasteride (5 mg qDay) and Tamsulosin - straight cath for high residuals ( pt at baseline has residuals 400-500's as per family; renal function is stable with cre arund 1?# Hx of Glaucoma -- Continue PTA Azelastine (Sub Ketotifen) and Timolol# fracture of L3 is old as per MRI reportDNRReassessed by PT; as pt needs 2 person assist for tranfers at this point;  STR now rec  Electronically Signed:[id number] Nicholis Stepanek, MD Beeper 02/10/2020,

## 2020-02-10 NOTE — Plan of Care
Plan of Care Overview/ Patient Status    Pt received in bed. A+Ox self. C/o mild-mod pain to left shoulder better with tylenol and tramadol PRN. Sling in place. +RP. Denies numbness/tingling to LUE.Lungs CTAB on room air.  Tolerated diet with total feed assistance. Pills in applesauce.Pt OOB yesterday with sit to stand device and 2 person assist.Periods of incontinence combined with urinary retention requiring straight caths x2.Skin appears intact. Glasses in place. Belongings at bedside. Bed alarm on. Patient/Family acknowledge understanding of fall prevention education including to call nurse with assistance with ambulation.[id number] Bladder scan 339ccProblem: Adult Inpatient Plan of CareGoal: Plan of Care ReviewOutcome: Interventions implemented as appropriateFlowsheets (Taken 02/10/2020 0730)Progress: no changePlan of Care Reviewed With: patientGoal: Patient-Specific Goal (Individualized)Outcome: Interventions implemented as appropriateFlowsheets (Taken 02/10/2020 0730)Individualized Care Needs: Safety, total feed, voidingWhat Anxieties, Fears, Concerns or Questions Do You Have About Your Care?: None statedPatient Centered Daily Goal: Safety and comfort maintainedPatient Centered Long Term Goal: Comfort maintainedGoal: Absence of Hospital-Acquired Illness or InjuryOutcome: Interventions implemented as appropriateGoal: Optimal Comfort and WellbeingOutcome: Interventions implemented as appropriateGoal: Readiness for Transition of CareOutcome: Interventions implemented as appropriate Problem: Skin Injury Risk IncreasedGoal: Skin Health and IntegrityOutcome: Interventions implemented as appropriate Problem: InfectionGoal: Infection Symptom ResolutionOutcome: Interventions implemented as appropriate Problem: Fall Injury RiskGoal: Absence of Fall and Fall-Related InjuryOutcome: Interventions implemented as appropriateProblem: Bowel Elimination Impaired (Orthopaedic Fracture)Goal: Effective Bowel EliminationOutcome: Interventions implemented as appropriate Problem: Delayed Union/Nonunion (Orthopaedic Fracture)Goal: Fracture StabilityOutcome: Interventions implemented as appropriate Problem: Embolism (Orthopaedic Fracture)Goal: Absence of Embolism Signs and SymptomsOutcome: Interventions implemented as appropriate Problem: Functional Ability Impaired (Orthopaedic Fracture)Goal: Optimal Functional AbilityOutcome: Interventions implemented as appropriate Problem: Infection (Orthopaedic Fracture)Goal: Absence of Infection Signs and SymptomsOutcome: Interventions implemented as appropriate Problem: Neurovascular Compromise (Orthopaedic Fracture)Goal: Effective Tissue PerfusionOutcome: Interventions implemented as appropriate Problem: Pain (Orthopaedic Fracture)Goal: Acceptable Pain ControlOutcome: Interventions implemented as appropriate Problem: Respiratory Compromise (Orthopaedic Fracture)Goal: Effective Oxygenation and VentilationOutcome: Interventions implemented as appropriate Problem: Skin Injury (Orthopaedic Fracture)Goal: Skin Health and IntegrityOutcome: Interventions implemented as appropriate Problem: Urinary Retention (Orthopaedic Fracture)Goal: Effective Urinary EliminationOutcome: Interventions implemented as appropriate  Problem: Bleeding (Orthopaedic Fracture)Goal: Absence of BleedingOutcome: Interventions implemented as appropriate

## 2020-02-10 NOTE — Plan of Care
Problem: Adult Inpatient Plan of CareGoal: Plan of Care ReviewOutcome: Interventions implemented as appropriate Problem: Adult Inpatient Plan of CareGoal: Absence of Hospital-Acquired Illness or InjuryOutcome: Interventions implemented as appropriate Problem: Adult Inpatient Plan of CareGoal: Optimal Comfort and WellbeingOutcome: Interventions implemented as appropriate Problem: InfectionGoal: Infection Symptom ResolutionOutcome: Interventions implemented as appropriate Problem: Skin Injury Risk IncreasedGoal: Skin Health and IntegrityOutcome: Interventions implemented as appropriate Problem: Fall Injury RiskGoal: Absence of Fall and Fall-Related InjuryOutcome: Interventions implemented as appropriate Plan of Care Overview/ Patient Status    1905 pt in bed lethargic,easily aroused.oriented to his name & birthday date & year.disoriented to place,time & situation,reoriented..pt removed left arm sling,reapplied.radial pulse strong.left hand/bruising/reddened,left shoulder/upper arm swollen ecchymotic.no sob noted.no grimaced/pain noted.pt verbalizes needs.able to take meds whole w/ apple sauce.no coughing noted while taking po fluids.pt incontinent of urine,diaper changed.no bm.skn intact.0030 pt screaming as per cna, pt states wants to urinate,urinal offered not voided in urinal,pt incontinent,diaper changed.c/o left arm pain on movement,left arm sling maintained.due tylenol 3 tabs given as ordered w/ apple sauce.pt slept afterwards.

## 2020-02-10 NOTE — Progress Notes
Pt slept well.0630 pt c/o a lot of pain left shoulder/axilla/arm.tramadol 25 mg given. pt incontinent of urine,diaper changed.0630 bladder scan shows [phone] straight cath for 700 cc strong odor cloudy reddish urine.

## 2020-02-11 NOTE — Plan of Care
Inpatient Physical Therapy Progress NoteIP Adult PT Eval/Treat - 02/11/20 0920    Date of Visit / Treatment  Date of Visit / Treatment  02/11/20   Note Type  Daily Note   Start Time  850   End Time  920   Total Treatment Time  30 mins    General Information  Subjective   Do I have to move with you   General Observations  Pt. found supine in bed agreeable to PT with lots of encouragement   Precautions/Limitations  fall precautions;bed alarm;chair alarm   Fall History  yes, reason for admission    Weight Bearing Status  Weight Bearing Status  WNL - Within normal limits   LUE Weight-Bearing Status  nonweight-bearing    Vital Signs and Orthostatic Vital Signs  Vital Signs  Vital Signs Stable   Vital Signs Free text  RA    Pain/Comfort  Pain Comment (Pre/Post Treatment Pain)  C/O Min  L shoulder pain with mobility. Nurse aware    Patient Coping  Observed Emotional State  calm   Verbalized Emotional State  acceptance   Diversional Activities  television    Skin Assessment  Skin Assessment  See Nursing Documentation    Balance  Sitting Balance: Static   FAIR       Maintains static position without assist or device, may require Supervision or Verbal Cues (<2 minutes)   Sitting Balance: Dynamic   FAIR-     Performs dynamic activities through partial range (50-75%) with Contact Guard   Standing Balance: Static  POOR      Moderate assist to maintain static position with no Assistive Device   Standing Balance: Dynamic   POOR-    Unable to move from midline voluntarily, maximal assist to right self   Balance Assist Device  [facility]   Balance Skills Training Comment  A x 2    Bed Mobility  Rolling/Turning Right - Independence/Assistance Level  Moderate assist;Assist of 1   Rolling/Turning Assist Device  Bed rails   Supine-to-Sit Independence/Assistance Level  Moderate assist;Assist of 1   Supine-to-Sit Assist Device  Draw pad;Head of bed elevated   Sit-to-Supine Independence/Assistance Level  Moderate assist;Assist of 1   Sit-to-Supine Assist Device  Draw pad;Head of bed elevated   Limitations  decreased ability to use arms for pushing/pulling   Bed Mobility, Impairments  strength decreased;impaired balance;pain   Bed mobility was limited by:  impaired motor function;pain   Bed Mobility Comments  Verbal cues for sequencing    Sit-Stand Transfer Training  Symptoms Noted During/After Treatment (Sit-to-Stand Transfer Training)  fatigue   Sit-to-Stand Transfer Independence/Assistance Level  Moderate assist;Assist of 1   Sit-to-Stand Transfer Assist Device  Hand held assist   Stand-to-Sit Transfer Independence/Assistance Level  Moderate assist;Assist of 1   Stand-to-Sit Transfer Assist Device  Hand held assist   Transfer Safety Analysis Concerns  losing balance backward;decreased sequencing ability;decreased weight-shifting ability;cues for hand placement   Transfer Safety Analysis Impairments  impaired balance;impaired motor control;pain;decreased strength   Transfers were limited by:  pain;cognition   Sit-Stand Transfer Comments  Retropulsive but stood for 30 seconds x 2 at EOB   Stand-Sit Transfer Comments  Verbal cues to maintain NWB LUE    Bed-Chair Transfer Training  Bed-Chair Transfer Comments  Unable to ambulate at this time. Pt. put back to bed per nursing request.    Gait Training  Independence/Assistance Level   Unable to perform    Handoff Documentation  Handoff  Patient  in bed;Bed alarm;Patient instructed to call nursing for mobility;Discussed with nursing    Endurance  Endurance Comments  Fair-    AM-PAC - Basic Mobility Screen- How much help from another person do you currently need.....  Turning from your back to your side while in a a flat bed without using rails?  2 - A Lot - Requires a lot of help (maximum to moderate assistance). Can use assistive devices. Moving from lying on your back to sitting on the side of a flat bed without using bed rails?  2 - A Lot - Requires a lot of help (maximum to moderate assistance). Can use assistive devices.   Moving to and from a bed to a chair (including a wheelchair)?  1 - Total - Requires total assistance or cannot do it at all.   Standing up from a chair using your arms(e.g., wheelchair or bedside chair)?  1 - Total - Requires total assistance or cannot do it at all.   To walk in a hospital room?  1 - Total - Requires total assistance or cannot do it at all.   Climbing 3-5 steps with a railing?  1 - Total - Requires total assistance or cannot do it at all.   AMPAC Mobility Score  8   Highest Level of Mobility TARGET  Mobility Level 3,Sit on edge of bed    Lower Extremity Exercises  Supine Exercises  Heelslides;Hip abduction;Hip adduction;Straight leg raise;Quad sets;Glut sets;Short arc quad;Ankle pumps;10x each;Bilateral;AAROM    Therapeutic Functional Activity  Therapeutic Functional Activity Comments  Pt. educated on NWB LUE and importance of mobility and exercises daily. Pt. was very lethargic and only opened eyes on several occassions but did follow exercise program.    Clinical Impression  Rehab Diagnosis  Impaired mobility   Follow up Assessment  Progress mobility   Criteria for Skilled Therapeutic Interventions Met  yes;treatment indicated   Pathology/Pathophysiology Noted (Describe Specifically for Each System)  musculoskeletal   Impairments Found (describe specific impairments)  aerobic capacity/endurance   Functional Limitations in Following Categories (Describe Specific Limitations)  mobility   Rehab Potential  fair, will monitor progress closely    Patient/Family Stated Goals  Patient/Family Stated Goal(s)  feel better    Frequency/Equipment Recommendations  PT Frequency  Daily    PT Recommendations for Inpatient Admission  Activity/Level of Assist  out of bed;transfers only;assist of 2  Use of Stedy  Positioning  reposition frequently   Other/Comments  OOB to chair daily with assist x 2    Planned Treatment / Interventions  Plan for Next Visit  Progress mobllity   Education Treatment / Interventions  Patient Education / Training  Safe transfers and mobility  Planned Treatment/Interventions Comments  Role of PT and POC    PT Discharge Summary  PT Disposition Recommendation(s)  STR (Short Term Rehab at a SNF rehab unit)   Equipment Recommendations for Discharge  Patient has all necessary durable medical equipment   Rayvon Char, PTMHB: [phone]  Plan of Care Overview/ Patient Status

## 2020-02-11 NOTE — Plan of Care
Plan of Care Overview/ Patient Status    P.T. NICOLE RECOMMENDING STR.SPOKE W/ PT'S DTR  [name] & SHE ALSO SPOKE W/ P.T. NICOLE. DTR NOW IN AGREEMENT W/ STR AT THE [facility]. REFERRAL ALREADY SENT ON 4/9. PRI & SCREEN ALREADY DONE. COVID TO BE DONE TODAY SUN 4/11. NEED TRANSPORTATION.Natividad Brood RN Case [phone]  [phone]  mobile heartbeat phone

## 2020-02-11 NOTE — Progress Notes
[facility] Progress NoteAttending Provider: Marshell Levan, MD Subjective                                                                              Subjective: Interim History: no new issues; no significant pains at rest; some soreness in throat; no dysuria, abd painsReview of Allergies/Meds/Hx: Review of Allergies/Meds/Hx:I have reviewed the patient's: allergies and current scheduled medications Objective Objective: Vitals:Last 24 hours: Temp:  [97.2 ?F (36.2 ?C)-98.9 ?F (37.2 ?C)] 97.8 ?F (36.6 ?C)Pulse:  [54-68] 67Resp:  [16-20] 20BP: (100-156)/(59-77) 156/77SpO2:  [95 %-98 %] 98 %I/O's:Gross Totals (Last 24 hours) at 02/11/2020 1128Last data filed at 02/11/2020 0100Intake -- Output 610 ml Net -610 ml Procedures:Physical Exam Constitutional: No distress. HENT: Head: Normocephalic and atraumatic. Eyes: Pupils are equal, round, and reactive to light. EOM are normal. Neck: Normal range of motion. Neck supple. No JVD present. Cardiovascular: irregular Pulmonary/Chest: Effort normal and breath sounds normal. Abdominal: Soft. Bowel sounds are normal. Musculoskeletal:    Comments: LUE in sling Neurological: No cranial nerve deficit. Sleepy this am but arouses easily and answering simple questions appropriately; answering simple questions appropriately; has poor vision and is HOH as well; no focal paralysis on gross exam Psychiatric: He has a normal mood and affect. His behavior is normal. Labs:Last 24 hours: No results found for this or any previous visit (from the past 24 hour(s)).Diagnostics:[place] pelvisThe patient is status post left hip arthroplasty. There is no radiographic evidence of hardware complication or failure. There is an age-indeterminate L3 compression fracture with lytic components. There is 4 mm of retropulsion resulting in severe spinal canal stenosis. There is a nonaggressive sclerotic density within the right sacrum. The visualized pelvic viscera are unremarkable. There is no bowel obstruction, free fluid, or free air. There is no pelvic adenopathy.  Impression: ? IMPRESSION: Age-indeterminate L3 compression fracture with lytic components and 4 mm of retropulsion resulting in severe canal stenosis. This raises concern for pathological fracture.  Left shoulder xray:FINDINGS: There is a fracture of the surgical neck of the left humerus with some impaction and medial displacement not fully assessed on the single view.  Impression: ? IMPRESSION: Proximal left humeral fracture.   Assessment Assessment: 86yo male, DNR, from the [facility] (assisted living) w/ atrial fibrillation, hypertension, diabetes?not on medication, cavernous malformation, memory loss, hx of COVID, gait disorder, anxiety,  BPH, hypothyroidism admitted after mechanical fall with fracture left humerus Plan Plan: # Fall with Humerus Fracture - no weight bearing on LUE; arm sling; follow with ortho in 2 weeks with repeat xray ( Dr Alfredo Batty or Dr Atilano Ina) - PT/OT as tolerated- scheduled Tylenol; prn Tramadol  PRN for breakthrough pain?# Hx of HTN/HLD -Continue PTA Metoprolol (ER 12.5 mg qDay) and Pravastatin (Sub Crestor)?# Hx of Hypothyroidism - - Continue PTA Synthroid (175 mcg qDay)?# Hx of Mood Disorder - Olanzapine (2.5 mg qHS)?# Hx of GERD -- Continue PTA Omeprazole (Sub Pantoprazole)?# Hx of BPH -- Continue PTA Finasteride (5 mg qDay) and Tamsulosin - straight cath for high residuals ( pt at baseline has residuals 400-500's as per family; renal function is stable with cre arund 1?# Hx of Glaucoma -- Continue PTA Azelastine (Sub Ketotifen) and Timolol# fracture of  L3 -  old as per MRI reportDNRReassessed by PT; as pt needs 2 person assist for tranfers at this point;  STR now rec [facility] tomorrow if remains  stable; COVID PCR for clearance ordered  Electronically Signed:[id number] Jeanny Rymer, MD Beeper 02/11/2020,

## 2020-02-11 NOTE — Plan of Care
Plan of Care Overview/ Patient Status    [phone]- Patient received in dual RN bedside handoff. Alert to self. On room air, no respiratory distress noted. Blue sling to left arm noted. Left hand ecchymotic. Bed alarm set. Safety maintained. Frequent rounding for comfort and safety. Call bell within reach. 2040- Patient resting in bed. Lungs CTA, on room air, denies sob and chest pain. No cough noted. Abdomen soft, nondistended, +BS, +flatus, -BM. Incontinent of urine. Diaper currently dry. Bladder scanned for 390 mL. +pp, +csm, UTA numbness/tingling. LUE + radial pulse, <3 sec cap refill, warm, wiggles fingers.  Patient denies nausea and pain at this time. Bed alarm set.2155- Bladder scanned for 488 mL, Dr. Ernesta Amble notified for straight cath order. 2110- Unable to hear Dr. Ernesta Amble when he called, texted him on Mobile heartbeat. 2220- straight cath order received.0100- Diaper with small amount of urine. Bladder scan 620 mL, straight cath for 510 mL., urine amber, cloudy, malodorous.  Post cath residual 100 mL.0131- Tylenol administered. 0300- Patient sleeping0500- Patient sleeping.9604- Synthroid administered with apple sauce. Bladder scanned for 280 mL. Diaper dry.

## 2020-02-11 NOTE — Plan of Care
Problem: Adult Inpatient Plan of CareGoal: Plan of Care ReviewOutcome: Interventions implemented as appropriateGoal: Patient-Specific Goal (Individualized)Outcome: Interventions implemented as appropriateGoal: Absence of Hospital-Acquired Illness or InjuryOutcome: Interventions implemented as appropriateGoal: Optimal Comfort and WellbeingOutcome: Interventions implemented as appropriateGoal: Readiness for Transition of CareOutcome: Interventions implemented as appropriate Plan of Care Overview/ Patient Status    Dual RN bedside handoff completedPatient alert to self; sleepy, eyes closed but answers questions appropriately.  Prefers to wear hat and glasses.  Vss.  Oxygen saturation good on room air.  Good appetite/total feed/total careHNV bladder scanned as ordered for < 500Good appetite/total feed. Tolerating liquids and pills(crushed in AS)  L shoulder bruised/ice pack for comfort.  Sling in place.  Radial pulse + L hand bruised.Supported with pillow.  No c/o pain unless moved.  PT was able to get patient to stand for a minute/patient wanted to return to bed.1300- Covid swab obtained for screening.Daughter called for update. 1400- private aide at bedside1500- taking po fluids/encouraged1600- patient straight cathed for 650 cc dark, amber cloudy urine.  Reported to Dr. Kipp Laurence culture for now1700- scheduled tylenol for L shoulder pain with effect1800- tolerated diet/spoke to family on phone.

## 2020-02-12 DIAGNOSIS — Z91010 Allergy to peanuts: Secondary | ICD-10-CM

## 2020-02-12 DIAGNOSIS — N400 Enlarged prostate without lower urinary tract symptoms: Secondary | ICD-10-CM

## 2020-02-12 DIAGNOSIS — Z79890 Hormone replacement therapy (postmenopausal): Secondary | ICD-10-CM

## 2020-02-12 DIAGNOSIS — Z20822 Contact with and (suspected) exposure to covid-19: Secondary | ICD-10-CM

## 2020-02-12 DIAGNOSIS — I480 Paroxysmal atrial fibrillation: Secondary | ICD-10-CM

## 2020-02-12 DIAGNOSIS — E039 Hypothyroidism, unspecified: Secondary | ICD-10-CM

## 2020-02-12 DIAGNOSIS — F419 Anxiety disorder, unspecified: Secondary | ICD-10-CM

## 2020-02-12 DIAGNOSIS — I10 Essential (primary) hypertension: Secondary | ICD-10-CM

## 2020-02-12 DIAGNOSIS — K219 Gastro-esophageal reflux disease without esophagitis: Secondary | ICD-10-CM

## 2020-02-12 DIAGNOSIS — Z881 Allergy status to other antibiotic agents status: Secondary | ICD-10-CM

## 2020-02-12 DIAGNOSIS — E559 Vitamin D deficiency, unspecified: Secondary | ICD-10-CM

## 2020-02-12 DIAGNOSIS — E1139 Type 2 diabetes mellitus with other diabetic ophthalmic complication: Secondary | ICD-10-CM

## 2020-02-12 DIAGNOSIS — S6292XA Unspecified fracture of left wrist and hand, initial encounter for closed fracture: Secondary | ICD-10-CM

## 2020-02-12 DIAGNOSIS — H42 Glaucoma in diseases classified elsewhere: Secondary | ICD-10-CM

## 2020-02-12 DIAGNOSIS — Z880 Allergy status to penicillin: Secondary | ICD-10-CM

## 2020-02-12 DIAGNOSIS — Z79899 Other long term (current) drug therapy: Secondary | ICD-10-CM

## 2020-02-12 DIAGNOSIS — Z8616 Personal history of COVID-19: Secondary | ICD-10-CM

## 2020-02-12 DIAGNOSIS — E785 Hyperlipidemia, unspecified: Secondary | ICD-10-CM

## 2020-02-12 DIAGNOSIS — Z8701 Personal history of pneumonia (recurrent): Secondary | ICD-10-CM

## 2020-02-12 DIAGNOSIS — Z66 Do not resuscitate: Secondary | ICD-10-CM

## 2020-02-12 DIAGNOSIS — S42212A Unspecified displaced fracture of surgical neck of left humerus, initial encounter for closed fracture: Secondary | ICD-10-CM

## 2020-02-12 DIAGNOSIS — F329 Major depressive disorder, single episode, unspecified: Secondary | ICD-10-CM

## 2020-02-12 DIAGNOSIS — Z882 Allergy status to sulfonamides status: Secondary | ICD-10-CM

## 2020-02-12 DIAGNOSIS — K589 Irritable bowel syndrome without diarrhea: Secondary | ICD-10-CM

## 2020-02-12 LAB — COVID-19 CLEARANCE OR FOR PLACEMENT ONLY: BKR SARS-COV-2 RNA (COVID-19) (YH): NOT DETECTED

## 2020-02-12 MED ORDER — POLYETHYLENE GLYCOL 3350 17 GRAM ORAL POWDER PACKET
17 gram | Freq: Every day | ORAL | Status: DC
Start: 2020-02-12 — End: 2020-02-12

## 2020-02-12 MED ORDER — TRAMADOL 50 MG TABLET
50 mg | ORAL_TABLET | Freq: Four times a day (QID) | ORAL | Status: AC | PRN
Start: 2020-02-12 — End: ?

## 2020-02-12 MED ORDER — POLYETHYLENE GLYCOL 3350 17 GRAM ORAL POWDER PACKET
17 gram | Freq: Every day | ORAL | 3 refills | Status: AC
Start: 2020-02-12 — End: 2020-04-05

## 2020-02-12 NOTE — Plan of Care
1915 received pt at dual RN bedside handoff, pt currently sleeping. Bruising/swelling to left shoulder. Sling/ice in place. No sign/symptoms of distress at this time. Bed alarm on. Call bell in reach. Hourly rounding performed2030 incontinence care rendered +wet/+BMbladder scan to selfVS stable on room airLS diminished b/lLeft shoulder bruised/ swollen, sling in place. Refused ice at this time states makes him too cold, denies pain unless he is movedAbdomen non distended, +BS, soft non tender to touch. Pills taken crushed in apple sauceDiaper in place, c/o head on penis felt sore. Cream appliedBed alarm on, call bell in reach, hourly rounding performed  Plan of Care Overview/ Patient Status    Problem: Adult Inpatient Plan of CareGoal: Plan of Care ReviewOutcome: Interventions implemented as appropriateGoal: Patient-Specific Goal (Individualized)Outcome: Interventions implemented as appropriateGoal: Absence of Hospital-Acquired Illness or InjuryOutcome: Interventions implemented as appropriateGoal: Optimal Comfort and WellbeingOutcome: Interventions implemented as appropriateGoal: Readiness for Transition of CareOutcome: Interventions implemented as appropriate Problem: InfectionGoal: Infection Symptom ResolutionOutcome: Interventions implemented as appropriate Problem: Skin Injury Risk IncreasedGoal: Skin Health and IntegrityOutcome: Interventions implemented as appropriate Problem: Fall Injury RiskGoal: Absence of Fall and Fall-Related InjuryOutcome: Interventions implemented as appropriate Problem: Occupational Therapy GoalsGoal: Occupational Therapy GoalsOutcome: Interventions implemented as appropriate Problem: Bleeding (Orthopaedic Fracture)Goal: Absence of BleedingOutcome: Interventions implemented as appropriate Problem: Bowel Elimination Impaired (Orthopaedic Fracture)Goal: Effective Bowel EliminationOutcome: Interventions implemented as appropriate Problem: Delayed Union/Nonunion (Orthopaedic Fracture)Goal: Fracture StabilityOutcome: Interventions implemented as appropriate Problem: Embolism (Orthopaedic Fracture)Goal: Absence of Embolism Signs and SymptomsOutcome: Interventions implemented as appropriate Problem: Functional Ability Impaired (Orthopaedic Fracture)Goal: Optimal Functional AbilityOutcome: Interventions implemented as appropriate Problem: Infection (Orthopaedic Fracture)Goal: Absence of Infection Signs and SymptomsOutcome: Interventions implemented as appropriate Problem: Neurovascular Compromise (Orthopaedic Fracture)Goal: Effective Tissue PerfusionOutcome: Interventions implemented as appropriate Problem: Pain (Orthopaedic Fracture)Goal: Acceptable Pain ControlOutcome: Interventions implemented as appropriate Problem: Respiratory Compromise (Orthopaedic Fracture)Goal: Effective Oxygenation and VentilationOutcome: Interventions implemented as appropriate Problem: Skin Injury (Orthopaedic Fracture)Goal: Skin Health and IntegrityOutcome: Interventions implemented as appropriate Problem: Urinary Retention (Orthopaedic Fracture)Goal: Effective Urinary EliminationOutcome: Interventions implemented as appropriate Problem: Physical Therapy GoalsGoal: Physical Therapy GoalsDescription: PT GOALS1. Patient will perform bed mobility with minimal assist2. Patient will perform transfers with minimal assist using appropriate assistive device Outcome: Interventions implemented as appropriate

## 2020-02-12 NOTE — Plan of Care
Problem: Physical Therapy GoalsGoal: Physical Therapy GoalsDescription: PT GOALS1. Patient will perform bed mobility with minimal assist2. Patient will perform transfers with minimal assist using appropriate assistive device Outcome: Interventions implemented as appropriate Plan of Care Overview/ Patient Status    Inpatient Physical Therapy Progress NoteHPI/Precautions - 02/12/20 0950    Date of Visit / Treatment  Date of Visit / Treatment  02/12/20   Note Type  Progress Note   Progress Report Due  02/12/20   Start Time  950   End Time  1020   Total Treatment Time  30    General Information  Pertinent History Of Current Problem  S/P fall sustaining humeral fx.Patient is currently NWB LUE.   Subjective   it hurts   General Observations  Found in bed.Left UE in placed and adjusted.Alert and able to participate.   Precautions/Limitations  fall precautions;bed alarm;chair alarm   Fall History  yes, reason for admission    Weight Bearing Status  LUE Weight-Bearing Status  nonweight-bearing     Vitals/Pain - 02/12/20 1020    Vital Signs and Orthostatic Vital Signs  Vital Signs: Pre, During, & Post treatment  Pre Vitals;Post Vitals;During Vitals      Pre Treatment O2 Delivery  room air   During Treatment O2 Delivery  room air   Symptoms Noted During Treatment  increased pain      Post Treatment O2 Delivery  room air    Pain/Comfort  Pain Location - Side  Left   Pain Location  shoulder   Pain Comment (Pre/Post Treatment Pain)  unable to quantify    Patient Coping  Observed Emotional State  cooperative   Cognition/Vision - 02/12/20 1020    Cognition  Overall Cognitive Status  Impaired   Level of Consciousness  alert   Following Commands  Follows one step commands with repetition;Follows one step commands with increased time   Personal Safety / Judgment  Fall risk    MMT/Musculo - 02/12/20 1020    Musculoskeletal  LLE Muscle Strength Grading  4-->active movement against gravity and resistance   RLE Muscle Strength Grading  4-->active movement against gravity and resistance   Musculoskeletal Comments  at least 3+/5 B/L LE grossly graded.no LE buckling in standing     Posture - 02/12/20 1020    Posture, Head/Trunk Alignment  Posture, Head/Trunk Alignment  Kyphotic   Posture, Head/Trunk Alignment Comments  forward down.vc's for upright posture   Balance - 02/12/20 1020    Balance  Sitting Balance: Static   FAIR       Maintains static position without assist or device, may require Supervision or Verbal Cues (<2 minutes)   Sitting Balance: Dynamic   FAIR-     Performs dynamic activities through partial range (50-75%) with Contact Guard   Standing Balance: Static  POOR      Moderate assist to maintain static position with no Assistive Device   Standing Balance: Dynamic   POOR     Moves through 1/4 to 1/2 ROM range with moderate assist to right self   Balance Assist Device  Stedy   Balance Skills Training Comment  left sided leaning at eob.     Mobility - 02/12/20 1020    Bed Mobility  Supine-to-Sit Independence/Assistance Level  Moderate assist;Verbal cues;Assist of 1   Supine-to-Sit Assist Device  Bed rails;Draw pad;Head of bed elevated;Hand held assist   Limitations  decreased ability to use arms for pushing/pulling;decreased ability to use legs for bridging/pushing;impaired ability to control  trunk for mobility   Bed Mobility, Impairments  postural control impaired;strength decreased;pain;impaired balance   Bed mobility was limited by:  pain   Bed Mobility Comments  vc's to utilize bedrails using R hand    Sit-Stand Transfer Training  Symptoms Noted During/After Treatment (Sit-to-Stand Transfer Training)  increased pain   Sit-to-Stand Transfer Independence/Assistance Level  Minimum assist;Assist of 2;Verbal cues   Sit-to-Stand Transfer Assist Device  Stedy   Stand-to-Sit Transfer Independence/Assistance Level  Minimum assist;Verbal cues;Assist of 2   Stand-to-Sit Nurse, children's Analysis Concerns  decreased sequencing ability;cues for hand placement;losing balance backward   Transfer Safety Analysis Impairments  impaired balance;decreased strength;pain;impaired postural control   Transfers were limited by:  pain;cognition   Sit-Stand Transfer Comments  min retropulsion initial.hold onto stedy bar and able to pull self up x min of 2    Bed-Chair Transfer Training  Symptoms Noted During/After Treatment  increased pain   Bed-to-Chair Transfer Independence/Assistance Level  Verbal cues;Minimum assist;Assist of 2   Archivist Analysis Concerns  decreased balance during turns;decreased weight-shifting ability;decreased sequencing ability   Transfer Safety Analysis Impairment  impaired balance;decreased flexibility;decreased strength;pain;impaired postural control   Bed-Chair Transfer Comments  able to pull self up from stedy bar and took steps to bedside chair. no LE buckling noted.2 person assist for safety.bedside chair close to bed    Gait Training  Symptoms Noted During/After Treatment   fatigue   Independence/Assistance Level   Minimum assist;Assist of 2;Verbal cues   Assistive Device   Stedy   Gait Distance  bed to chair   Gait Pattern Analysis  step to gait   Gait Analysis Deviations  decreased cadence;decreased stride length   Gait Analysis Impairments  impaired balance;decreased strength;pain   Ambulation distance was limited by:  pain   Gait Training Comments  able to pull self up from stedy bar and took steps to bedside chair. no LE buckling noted.2 person assist for safety.bedside chair close to bed    Stair Performance  Independence/Assistance Level   Not tested Endurance  Endurance Comments  fair   Activity Tolerance  Fatigues quickly   PT Handoff - 02/12/20 1020    Handoff Documentation  Handoff  Chair alarm;Patient in chair;Patient instructed to call nursing for mobility;Discussed with nursing   Handoff Comments  RN made aware of transfer method -stedy lift   AMPAC Basic Mobility - 02/12/20 1020    AM-PAC - Basic Mobility Screen- How much help from another person do you currently need.....  Turning from your back to your side while in a a flat bed without using rails?  2 - A Lot - Requires a lot of help (maximum to moderate assistance). Can use assistive devices.   Moving from lying on your back to sitting on the side of a flat bed without using bed rails?  2 - A Lot - Requires a lot of help (maximum to moderate assistance). Can use assistive devices.   Moving to and from a bed to a chair (including a wheelchair)?  1 - Total - Requires total assistance or cannot do it at all.   Standing up from a chair using your arms(e.g., wheelchair or bedside chair)?  2 - A Lot - Requires a lot of help (maximum to moderate assistance). Can use assistive devices.   To walk in a hospital room?  1 - Total - Requires total assistance or cannot do it  at all.   Climbing 3-5 steps with a railing?  1 - Total - Requires total assistance or cannot do it at all.   AMPAC Mobility Score  9   Highest Level of Mobility TARGET  Mobility Level 3,Sit on edge of bed     Therapeutic Functional Activity - 02/12/20 1020    Therapeutic Functional Activity  Therapeutic Functional Activity Comments  postural education,NWB prec compliance LUE       Clinical Impression - 02/12/20 1020    Clinical Impression  Rehab Diagnosis  Impaired mobility   Follow up Assessment  Follow up session.Patient continues to have pain on his left UE.Sling in placed and educated as to NWB prec compliance LUE.Able to sit at eob with assistance required due to decrease trunk control leaning to left side.Today,patient was able to pull self up from stedy bar and took minimal steps from bed-bedside chair holding onto stedy bar with 2 person assist.No LE buckling noted.Minimal pain left shoulder. Patient was positioned comfortably post session propped with pillows and LE elevated.Continue with POC and progress as tolerated.  Prognosis  fair+   Criteria for Skilled Therapeutic Interventions Met  yes;treatment indicated   Pathology/Pathophysiology Noted (Describe Specifically for Each System)  musculoskeletal   Impairments Found (describe specific impairments)  aerobic capacity/endurance;gait;muscle strength;mobility;balance;pain   Functional Limitations in Following Categories (Describe Specific Limitations)  mobility   Rehab Potential  fair, will monitor progress closely    Frequency/Equipment Recommendations - 02/12/20 1020    Frequency/Equipment Recommendations  PT Frequency  Daily   Equipment Needs During Admission/Treatment  --  stedy  Recommendations for IP Admission - 02/12/20 1020    PT Recommendations for Inpatient Admission  Activity/Level of Assist  out of bed;transfers only;assist of 2;mechanical lift   ADL Recommendations  bedside commode;assist of 2;mechanical lift   Positioning  reposition frequently   Therapeutic Exercise  encourage exercise program issued   Other/Comments  (S) OOB daily with stedy lift   Planned Treatment/Interventions - 02/12/20 1020    Planned Treatment / Interventions  Plan for Next Visit  progress POC   General Treatment / Interventions  Aerobic Capacity / Endurance Conditioning;Therapeutic Exercise;Positioning;Discharge Planning   Training Treatment / Interventions  Balance / Gait Training;Strength Training;Endurance Haematologist / Interventions  Patient Education / Engineer, maintenance (IT);Compensatory Techniques Education   Discharge Summary - 02/12/20 1020    PT Discharge Summary  PT Disposition Recommendation(s)  STR (Short Term Rehab at a SNF rehab unit)   Equipment Recommendations for Discharge  To be determined pending progress    Kelli Hope, [facility]

## 2020-02-12 NOTE — Plan of Care
Problem: Adult Inpatient Plan of CareGoal: Plan of Care ReviewOutcome: Interventions implemented as appropriateGoal: Patient-Specific Goal (Individualized)Outcome: Interventions implemented as appropriateGoal: Absence of Hospital-Acquired Illness or InjuryOutcome: Interventions implemented as appropriateGoal: Optimal Comfort and WellbeingOutcome: Interventions implemented as appropriateGoal: Readiness for Transition of CareOutcome: Interventions implemented as appropriate Plan of Care Overview/ Patient Status    Dual RN bedside handoff completedOriented to self, birthdate, cooperative.  L arm/shoulder/hand/bruising/immobilized in sling.Radial pulse +, fingers mobile. No sob/no chest pain.0900C/o abdo cramping/thinks may have a bm.1215Up to recliner chair with PT- tolerated small amount of lunch/returned to bed1300Incontinent of bowel and bladder.  Bladder scans for < 300Received call from nursing supervisor about possible discharge to [facility] later today/awaiting MD approval.  Report given.Total care rendered and patient dressed in comfortable clothes.1400- called [facility] to let them know that patient will be arriving via ambulence/ switchboard connected to nursing floor but no answer.  Patient

## 2020-02-12 NOTE — Plan of Care
Plan of Care Overview/ Patient Status    PT FOR DC to [facility] 1400 via ambulance. Dtr Sinan Tuch aware and in agreement. IM reviewed on phone with her. [facility] notified and expecting pt.

## 2020-02-12 NOTE — Plan of Care
Plan of Care Overview/ Patient Status    Pt is accepted at [facility] for today if medically stable. Await MD clearance. Covid came back negative.

## 2020-02-12 NOTE — Plan of Care
Plan of Care Overview/ Patient Status    Pt will participate in OT re-evaluation.Pt will perform bed mobility with Mod A x 1.Pt will tolerate wearing sling to L UE. Pt will participate in BADLs. Occupational Therapy Re-EvaluationPatient Overview - 02/12/20 1157    Date of Visit / Treatment  Date of Visit / Treatment  02/12/20   Note Type  Re-Evaluation   End Time  1020   Total Treatment Time  25    Patient Overview  History of Present Illness  Pt is a 86 yo RHD male, DNR, from the [facility] w/ PMHx: atrial fibrillation, anxiety, hypertension, diabetes not on medication, cavernous malformation, memory loss/dememtia, hx of COVID, gait disorder, anxiety,  BPH, hypothyroidism admitted after mechanical fall with fracture left humerus.   Precautions  fall;non-weight bearing;left upper extremity   Precautions - Additional Details/Comments  bed/chair alarm;   Social History  other (comment)   Social History - Additional Details/Comments  Pt lives at [facility]. Pt has private 24/7 aide at baseline and requires assist with mobility and ADLs w/ RW and w/c. Pt also was receiving home PT.   Subjective  I'm not in good shape   General Observations  Pt presents supine in bed, resting with eyes close but awake. Agreeable to OT session   Modified Rankin - 02/12/20 1159    Modified Rankin Scale  Pre Modified Rankin Scale  4 -->Moderately severe disability, unable to walk without assistance and unable to attend to own bodily needs without assistance   Current Modified Rankin Scale  4 -->Moderately severe disability, unable to walk without assistance and unable to attend to own bodily needs without assistance   Assessment - 02/12/20 1159    Assessment  Cognition (Mentation/Communication)  alert;other (comment)   Cognition - Additional Details/Comments  Oriented to self;   Vital Signs  vital signs stable   Pain Location - Additional Details/Comments  Pt unable to rate pain,; Reports pain in L hip and L shoulder.   Skin Integrity/Edema  see skin documentation   Sensation  other (comment)   Sensation - Additional Details/Comments  unable to accurately assess 2/2 to decreased cognition   Range of Motion  other (comment);within functional limits except (see comments)   Range of Motion - Additional Details/Comments  L UE not tested 2/2 fracture; R UE WFL for age   Muscle Strength/Tone  within functional limits except (see comments);other (comment)   Muscle Strength/Tone - Additional Details/Comments  L UE not tested 2/2 fracture; R UE WFL for age   Balance - Additional Details/Comments  Fair (-) sitting; Poor standing   Functional Mobility - 02/12/20 1202    Functional Mobility  Rolling  Minimum assist;Assist of 1;Verbal cues;Nonverbal cues   Supine to/from Sit  Moderate assist;Assist of 1;Nonverbal cues;Verbal cues   Sit to/from Stand  Minimum assist;Assist of 2;Verbal cues;Nonverbal cues   Sit to/from Stand Device  Stedy  Pt able to use R hand to hold bar.  Bed to/from Chair  Minimum assist;Assist of 2;Verbal cues;Nonverbal cues   Bed to/from Chair Device  Stedy  Ambulation Distance  bed to chair with side steps to chair close to bed;  Overall Functional Mobility Comments  Pt able to perform sit<>stand with Min A x 2 using R UE to hold bar on stedy. Pt with poor balance and kyphotic posture; Cues to keep eyes open during transfer.  LUE maintained in sling at all times. Pt able to take small steps to bedside chair with min  A x 2 holding stedy bar.  Recommendations for IP Admission - 02/12/20 1212    OT Recommendations for Inpatient Admission  Activity/Level of Assist  out of bed;transfers only;assist of 2;mechanical lift   ADL Recommendations  transfers only;assist of 2;mechanical lift  or bedlevel  Activities of Daily Living - 02/12/20 1213    Activities of Daily Living  Upper Body Dressing  Maximum assist   Lower Body Dressing  Maximum assist   Toileting  Total assist/dependent;Maximum assist   Overall Activities of Daily Living Comments  Pt is a 86 yo RHD male, DNR, from the [facility] w/ PMHx: atrial fibrillation, anxiety, hypertension, diabetes not on medication, cavernous malformation, memory loss/dememtia, hx of COVID, gait disorder, anxiety,  BPH, hypothyroidism admitted after mechanical fall with fracture left humerus. Pt lives at [facility]. Pt has private 24/7 aide at baseline and requires assist with mobility and ADLs w/ RW and w/c. Pt also was receiving home PT. Pt presents supine in bed, resting with eyes close but awake. A & O x self. Pt unable to rate pain, reports pain in L arm and L hip. ROM and MMT: L UE not tested 2/2 fracture; R UE WFL for age; Pt requires assist with all BADLs at baseline. Pt able to perform sit<>stand with Min A x 2 with HHA. Pt with poor balance and kyphotic posture; Cues to keep eyes open during transfer.  LUE maintained in sling at all times. Recommend use of  Stedy/mechanical lift x 2 for transfers. Max-Total assist with BADLs. Recommend pt d/c to STR.   AMPAC Daily Activities - 02/12/20 1217     AM-PAC - Daily Activity IP Short Form  Help needed from another person putting on/taking off regular lower body clothing  2 - A Lot   Help needed from another person for bathing (incl. washing, rinsing, drying)  1 - Unable   Help needed from another person for toileting (incl. using toilet, bedpan, urinal)  1 - Unable   Help needed from another person putting on/taking off regular upper body clothing  2 - A Lot   Help needed from another person taking care of personal grooming such as brushing teeth  2 - A Lot   Help needed from another person eating meals  2 - A Lot   AM-PAC Daily Activity Raw Score (Total of rows above)  10   Clinical Impression/Recommendation - 02/12/20 1217    Clinical Impression / Recommendation  Initial Assessment  Pt is a 86 yo RHD male, DNR, from the [facility] w/ PMHx: atrial fibrillation, anxiety, hypertension, diabetes not on medication, cavernous malformation, memory loss/dememtia, hx of COVID, gait disorder, anxiety,  BPH, hypothyroidism admitted after mechanical fall with fracture left humerus. Pt lives at [facility]. Pt has private 24/7 aide at baseline and requires assist with mobility and ADLs w/ RW and w/c. Pt also was receiving home PT. Pt presents supine in bed, resting with eyes close but awake. A & O x self. Pt unable to rate pain, reports pain in L arm and L hip. ROM and MMT: L UE not tested 2/2 fracture; R UE WFL for age; Pt requires assist with all BADLs at baseline. Pt able to perform sit<>stand with Min A x 2 with HHA. Pt with poor balance and kyphotic posture; Cues to keep eyes open during transfer.  LUE maintained in sling at all times. Recommend use of  Stedy/mechanical lift x 2 for transfers. Max-Total assist with BADLs. Recommend pt d/c  to STR.   Patient Goal  reduce pain;return to prior level of function   OT Frequency  3x per week   OT Disposition Recommendation(s)  STR (Short Term Rehab at a SNF rehab unit)   Equipment Recommendations for Discharge  To be determined pending progress   OT Handoff - 02/12/20 1218    Handoff Documentation  Handoff  Patient in chair;Chair alarm;Patient instructed to call nursing for mobility;Discussed with nursing L UE sling in place with towel roll under neck strap for increased comfort.   Meriel Pica OTR/LOccupational TherapistPhysical Medicine DepartmentContact via Mobile [phone]

## 2020-02-12 NOTE — Discharge Summary
[facility]    Med/Surg Discharge Summary    Patient Data:    Patient Name: Keith Strickland Admit date: 02/09/2020   Age: 86 y.o. Discharge date: 02/12/20   DOB: [birth year]  Discharge Attending Physician: Jene Every, MD    MRN: [id number]  Discharged Condition: stable   PCP: Cherrie Distance, MD Disposition: Skilled Nursing Facility for Short Term Rehab. Call made to SNF Provider by Care Team Yes     Principal Diagnosis: Closed fracture of proximal end of left humerus, unspecified fracture morphology, initial encounter  Other Active Diagnoses:   Present on Admission:  ? Closed fracture of proximal end of left humerus, unspecified fracture morphology, initial encounter  ? HTN (hypertension)  ? GERD (gastroesophageal reflux disease)  ? Essential hypertension      Issues to be Addressed Post Discharge:     Issues to be Addressed Post Discharge:  1. Follow-up with ortho 2 weeks Dr. Alfredo Batty  2.   3.       Follow-up Information:  Cherrie Distance, MD             No future appointments.    Hospital Course:     Hospital Course:     86yo male, DNR, from the [facility] (assisted living) w/ atrial fibrillation, hypertension, diabetes?not on medication, cavernous malformation, memory loss, hx of COVID, gait disorder, anxiety,  BPH, hypothyroidism admitted after mechanical fall with fracture left humerus  ?  ?  ?  Plan      Plan:   ?  # Fall with Humerus Fracture   - no weight bearing on LUE; arm sling; follow with ortho in 2 weeks with repeat xray ( Dr Alfredo Batty or Dr Atilano Ina)   - PT/OT as tolerated  - scheduled Tylenol; prn Tramadol  PRN for breakthrough pain  ?  # Hx of HTN/HLD -  Continue PTA Metoprolol (ER 12.5 mg qDay) and Pravastatin (Sub Crestor)  ?  # Hx of Hypothyroidism -   - Continue PTA Synthroid (175 mcg qDay)  ?  # Hx of Mood Disorder -   Olanzapine (2.5 mg qHS).  Apparently no longer on Lexapro?  ?  # Hx of GERD -  - Continue PTA Omeprazole (Sub Pantoprazole)  ?  # Hx of BPH -  - Continue PTA Finasteride (5 mg qDay) and Tamsulosin   - straight cath for high residuals ( pt at baseline has residuals 400-500's as per family; renal function is stable with cre arund 1  ?  # Hx of Glaucoma -  - Continue PTA Azelastine (Sub Ketotifen) and Timolol  ?  # fracture of L3 -  old as per MRI report  ?  # dispo- DNR  Reassessed by PT; as pt needs 2 person assist for transfers at this point.  Transfer to [facility]    Total discharge time 35 minutes  ?            Pertinent lab findings and test results:   Objective:     Recent Labs   Lab 02/09/20  0128 02/10/20  0542   WBC 10.2 7.9   HGB 13.3* 13.1*   HCT 40.5* 40.4*   PLT 182 165    Recent Labs   Lab 02/09/20  0128 02/10/20  0542   NEUTROPHILS 69.9 62.4      Recent Labs   Lab 02/09/20  0128 02/10/20  0542   NA 142 140   K 4.5 4.2   CL 109 104  CO2 25 30   BUN 23 19   CREATININE 1.11 0.94   GLU 110* 91   ANIONGAP 8 6    Recent Labs   Lab 02/09/20  0128 02/10/20  0542   CALCIUM 8.5 8.5      No results for input(s): ALT, AST, ALKPHOS, BILITOT, BILIDIR in the last 168 hours. No results for input(s): PTT, LABPROT, INR in the last 168 hours.         Physical Exam     Discharge vitals:   Temp:  [97.2 ?F (36.2 ?C)-98.4 ?F (36.9 ?C)] 97.2 ?F (36.2 ?C)  Pulse:  [63-80] 79  Resp:  [16-19] 19  BP: (120-150)/(57-91) 120/76  SpO2:  [95 %-100 %] 96 %  Device (Oxygen Therapy): room air       Discharge Physical Exam:  Physical Exam  Constitutional: No distress.   HENT:   Head: Normocephalic and atraumatic.   Neck: Normal range of motion. Neck supple. No JVD present.   Cardiovascular:   irregular   Pulmonary/Chest: Effort normal and breath sounds normal.   Abdominal: Soft. Bowel sounds are normal.   Musculoskeletal:      Comments: LUE in sling   Neurological: No cranial nerve deficit.   Sleepy this am but arouses easily and answering simple questions appropriately; has poor vision and is HOH as well; no focal paralysis on gross exam   Psychiatric: He has a normal mood and affect. His behavior is normal.   ?      Allergies   Allergies   Allergen Reactions   ? Gluten Protein Other (See Comments)     intolerance   ? Lactose Other (See Comments)     intolerance   ? Avelox [Moxifloxacin]    ? Ciprofloxacin    ? Peanut    ? Penicillins    ? Sulfa (Sulfonamide Antibiotics)         PMH PSH   Past Medical History:   Diagnosis Date   ? A-fib (HC Code)    ? Anxiety    ? Closed displaced fracture of left femoral neck (HC Code)    ? DM (diabetes mellitus) (HC Code)    ? GERD (gastroesophageal reflux disease)    ? GERD (gastroesophageal reflux disease)    ? Glaucoma    ? HTN (hypertension)    ? Hyperlipidemia    ? Hypertension    ? Hypothyroid    ? Hypothyroidism    ? IBS (irritable bowel syndrome)    ? Kyphosis    ? Lumbosacral radiculopathy    ? Major depression    ? Osteoarthritis    ? Paroxysmal atrial fibrillation (HC Code)    ? Pneumonia    ? Vitamin deficiency       Past Surgical History:   Procedure Laterality Date   ? FRACTURE SURGERY          Social History Family History   Social History     Tobacco Use   ? Smoking status: Never Smoker   ? Smokeless tobacco: Never Used   Substance Use Topics   ? Alcohol use: No      Family History   Family history unknown: Yes          Discharge Medications:   Discharge:   Current Discharge Medication List      START taking these medications    Details   polyethylene glycol (MIRALAX) 17 gram packet Take 1 packet (17 g total) by mouth daily. Mix  in 8 ounces of water, juice, soda, coffee or tea prior to taking.  Qty: 14 each, Refills: 2  Start date: 02/12/2020      traMADoL (ULTRAM) 50 mg tablet Take 0.5 tablets (25 mg total) by mouth every 6 (six) hours as needed for up to 10 days.  Qty: 30 tablet  Start date: 02/12/2020, End date: 02/22/2020         CONTINUE these medications which have NOT CHANGED    Details   acetaminophen (TYLENOL) 325 MG tablet Take 650 mg by mouth every 6 (six) hours as needed (Pain/Fever).       aluminum-magnesium hydroxide-simethicone (MAALOX) 200-200-20 mg/5 mL suspension Take 30 mLs by mouth every 6 (six) hours as needed for indigestion.       azelastine (OPTIVAR) 0.05 % ophthalmic solution Place 1 drop into both eyes daily.      bismuth subsalicylate (PEPTO BISMOL,KAOPECTATE) 262 mg/15 mL suspension Take 30 mLs by mouth every 6 (six) hours as needed (Diarrhea).      cholecalciferol, vitamin D3, 125 mcg (5,000 unit) tablet Take 5,000 Units by mouth daily.       finasteride (PROSCAR) 5 mg tablet Take 5 mg by mouth daily.      guaiFENesin (GERI-TUSSIN) 100 mg/5 mL Liqd Take 200 mg by mouth every 4 (four) hours as needed for cough.      levothyroxine (SYNTHROID, LEVOTHROID) 175 MCG tablet Take 175 mcg by mouth every morning.       magnesium hydroxide (MILK OF MAGNESIA) 400 mg/5 mL suspension Take 30 mLs by mouth daily as needed for constipation.      melatonin 1 mg tablet Take 1 mg by mouth at bedtime.      metoprolol (TOPROL-XL) 25 MG 24 hr tablet Take 0.5 tablets (12.5 mg total) by mouth daily. Take with or immediately following a meal.  Qty: 30 tablet, Refills: 2      OLANZapine (ZYPREXA) 2.5 MG tablet Take 1 tablet (2.5 mg total) by mouth nightly.  Qty: 30 tablet, Refills: 0      omeprazole (PRILOSEC) 20 mg capsule Take 20 mg by mouth 2 times daily (0900, 1700).       pravastatin (PRAVACHOL) 20 MG tablet Take 1 tablet (20 mg total) by mouth daily with dinner.  Qty: 30 tablet, Refills: 2      tamsulosin (FLOMAX) 0.4 mg 24 hr capsule Take 2 capsules (0.8mg ) by mouth at bedtime.      timolol (TIMOPTIC) 0.5 % ophthalmic solution Place 1 drop into both eyes daily.      travoprost (TRAVATAN Z) 0.004 % ophthalmic solution Place 1 drop into both eyes daily.          STOP taking these medications       famotidine (PEPCID) 20 mg tablet        guaiFENesin (MUCINEX) 600 mg 12 hr extended release tablet        escitalopram oxalate (LEXAPRO) 10 MG tablet              There was at total of approximately 45 minutes spent on the discharge of this patient    Electronically Signed:  Jene Every, MD   02/12/2020   12:11 PM  Best Contact Information: [phone]

## 2020-02-13 ENCOUNTER — Other Ambulatory Visit: Admit: 2020-02-13 | Payer: PRIVATE HEALTH INSURANCE | Attending: Internal Medicine | Primary: Internal Medicine

## 2020-02-13 DIAGNOSIS — Z7689 Persons encountering health services in other specified circumstances: Secondary | ICD-10-CM

## 2020-02-13 DIAGNOSIS — Z1159 Encounter for screening for other viral diseases: Secondary | ICD-10-CM

## 2020-02-13 LAB — CBC WITHOUT DIFFERENTIAL
BKR WAM ANALYZER ANC: 8.5 x 1000/ÂµL — ABNORMAL HIGH (ref 1.8–7.3)
BKR WAM HEMATOCRIT: 40.4 % — ABNORMAL LOW (ref 41.0–54.0)
BKR WAM HEMOGLOBIN: 13.4 g/dL — ABNORMAL LOW (ref 13.7–18.0)
BKR WAM MCH (PG): 30.5 pg (ref 25.7–31.0)
BKR WAM MCHC: 33.2 g/dL (ref 32.0–36.0)
BKR WAM MCV: 91.8 fL (ref 80.0–99.0)
BKR WAM MPV: 10.1 fL (ref 9.8–12.3)
BKR WAM PLATELETS: 245 x1000/ÂµL (ref 140–446)
BKR WAM RDW-CV: 12.7 % (ref 11.5–14.5)
BKR WAM RED BLOOD CELL COUNT: 4.4 M/ÂµL — ABNORMAL LOW (ref 4.6–6.1)
BKR WAM WHITE BLOOD CELL COUNT: 11.5 x1000/ÂµL — ABNORMAL HIGH (ref 3.8–10.6)

## 2020-02-13 LAB — COMPREHENSIVE METABOLIC PANEL
BKR A/G RATIO: 0.9
BKR ALANINE AMINOTRANSFERASE (ALT): 17 U/L (ref 12–78)
BKR ALBUMIN: 3 g/dL — ABNORMAL LOW (ref 3.4–5.0)
BKR ALKALINE PHOSPHATASE: 81 U/L (ref 20–135)
BKR ANION GAP: 9 (ref 5–18)
BKR ASPARTATE AMINOTRANSFERASE (AST): 15 U/L (ref 5–37)
BKR AST/ALT RATIO: 0.9
BKR BILIRUBIN TOTAL: 0.7 mg/dL (ref 0.0–1.0)
BKR BLOOD UREA NITROGEN: 42 mg/dL — ABNORMAL HIGH (ref 8–25)
BKR BUN / CREAT RATIO: 30.7 — ABNORMAL HIGH (ref 8.0–25.0)
BKR CALCIUM: 8.6 mg/dL (ref 8.4–10.3)
BKR CHLORIDE: 97 mmol/L (ref 95–115)
BKR CO2: 27 mmol/L (ref 21–32)
BKR CREATININE: 1.37 mg/dL — ABNORMAL HIGH (ref 0.50–1.30)
BKR EGFR (AFR AMER): 58 mL/min/{1.73_m2} (ref >60)
BKR EGFR (NON AFRICAN AMERICAN): 48 mL/min/{1.73_m2} (ref >60)
BKR GLOBULIN: 3.3 g/dL
BKR GLUCOSE: 115 mg/dL — ABNORMAL HIGH (ref 70–100)
BKR OSMOLALITY CALCULATION: 278 mOsm/kg (ref 275–295)
BKR POTASSIUM: 5 mmol/L (ref 3.5–5.1)
BKR PROTEIN TOTAL: 6.3 g/dL — ABNORMAL LOW (ref 6.4–8.2)
BKR SODIUM: 133 mmol/L — ABNORMAL LOW (ref 136–145)

## 2020-02-13 LAB — SARS COV-2 (COVID-19) RNA: BKR SARS-COV-2 RNA (COVID-19) (YH): NEGATIVE

## 2020-02-20 ENCOUNTER — Other Ambulatory Visit: Admit: 2020-02-20 | Payer: PRIVATE HEALTH INSURANCE | Attending: Internal Medicine | Primary: Internal Medicine

## 2020-02-20 DIAGNOSIS — S42202D Unspecified fracture of upper end of left humerus, subsequent encounter for fracture with routine healing: Secondary | ICD-10-CM

## 2020-02-20 LAB — URINALYSIS-MACROSCOPIC W/REFLEX MICROSCOPIC
BKR BILIRUBIN, UA: NEGATIVE
BKR GLUCOSE, UA: NEGATIVE
BKR KETONES, UA: NEGATIVE
BKR LEUKOCYTE ESTERASE, UA: POSITIVE — AB
BKR NITRITE, UA: NEGATIVE
BKR PH, UA: 8.5 — ABNORMAL HIGH (ref 5.5–7.5)
BKR SPECIFIC GRAVITY, UA: >1.03 — ABNORMAL HIGH (ref 1.005–1.030)
BKR UROBILINOGEN, UA: <2 EU/dL (ref <=2.0)

## 2020-02-20 LAB — URINE MICROSCOPIC     (BH GH LMW YH)

## 2020-02-22 ENCOUNTER — Other Ambulatory Visit: Admit: 2020-02-22 | Payer: PRIVATE HEALTH INSURANCE | Attending: Internal Medicine | Primary: Internal Medicine

## 2020-02-22 DIAGNOSIS — Z7689 Persons encountering health services in other specified circumstances: Secondary | ICD-10-CM

## 2020-02-22 LAB — COMPREHENSIVE METABOLIC PANEL
BKR A/G RATIO: 0.7
BKR ALANINE AMINOTRANSFERASE (ALT): 31 U/L (ref 12–78)
BKR ALBUMIN: 2.7 g/dL — ABNORMAL LOW (ref 3.4–5.0)
BKR ALKALINE PHOSPHATASE: 117 U/L (ref 20–135)
BKR ANION GAP: 9 (ref 5–18)
BKR ASPARTATE AMINOTRANSFERASE (AST): 23 U/L (ref 5–37)
BKR AST/ALT RATIO: 0.7
BKR BILIRUBIN TOTAL: 0.5 mg/dL (ref 0.0–1.0)
BKR BLOOD UREA NITROGEN: 44 mg/dL — ABNORMAL HIGH (ref 8–25)
BKR BUN / CREAT RATIO: 34.9 — ABNORMAL HIGH (ref 8.0–25.0)
BKR CALCIUM: 8.7 mg/dL (ref 8.4–10.3)
BKR CHLORIDE: 103 mmol/L (ref 95–115)
BKR CO2: 23 mmol/L (ref 21–32)
BKR CREATININE: 1.26 mg/dL (ref 0.50–1.30)
BKR EGFR (AFR AMER): >60 mL/min/{1.73_m2} (ref >60)
BKR EGFR (NON AFRICAN AMERICAN): 53 mL/min/{1.73_m2} (ref >60)
BKR GLOBULIN: 3.7 g/dL
BKR GLUCOSE: 85 mg/dL (ref 70–100)
BKR OSMOLALITY CALCULATION: 281 mosm/kg (ref 275–295)
BKR POTASSIUM: 5.2 mmol/L — ABNORMAL HIGH (ref 3.5–5.1)
BKR PROTEIN TOTAL: 6.4 g/dL (ref 6.4–8.2)
BKR SODIUM: 135 mmol/L — ABNORMAL LOW (ref 136–145)

## 2020-02-22 LAB — CBC WITH AUTO DIFFERENTIAL
BKR WAM ABSOLUTE IMMATURE GRANULOCYTES: 0.2 x 1000/ÂµL (ref 0.0–0.2)
BKR WAM ABSOLUTE LYMPHOCYTE COUNT: 2 x 1000/ÂµL (ref 1.0–2.3)
BKR WAM ABSOLUTE NRBC: 0 x 1000/ÂµL (ref <=0.0)
BKR WAM ANALYZER ANC: 10.3 x 1000/ÂµL — ABNORMAL HIGH (ref 1.8–7.3)
BKR WAM BASOPHIL ABSOLUTE COUNT: 0.1 x 1000/ÂµL (ref 0.0–0.6)
BKR WAM BASOPHILS: 0.3 % (ref 0.0–2.0)
BKR WAM EOSINOPHIL ABSOLUTE COUNT: 0.1 x 1000/ÂµL (ref 0.0–0.4)
BKR WAM EOSINOPHILS: 0.7 % (ref 0.0–6.0)
BKR WAM HEMATOCRIT: 38.8 % — ABNORMAL LOW (ref 41.0–54.0)
BKR WAM HEMOGLOBIN: 12.6 g/dL — ABNORMAL LOW (ref 13.7–18.0)
BKR WAM IMMATURE GRANULOCYTES: 1.6 % (ref 0.0–2.0)
BKR WAM LYMPHOCYTES: 14.1 % (ref 14.0–43.0)
BKR WAM MCH (PG): 30.1 pg (ref 25.7–31.0)
BKR WAM MCHC: 32.5 g/dL — ABNORMAL HIGH (ref 32.0–36.0)
BKR WAM MCV: 92.8 fL (ref 80.0–99.0)
BKR WAM MONOCYTE ABSOLUTE COUNT: 1.7 x 1000/ÂµL — ABNORMAL HIGH (ref 0.4–1.3)
BKR WAM MONOCYTES: 11.9 % (ref 0.0–14.0)
BKR WAM MPV: 9.4 fL — ABNORMAL LOW (ref 9.8–12.3)
BKR WAM NEUTROPHILS: 71.4 % — ABNORMAL LOW (ref 38.0–74.0)
BKR WAM NUCLEATED RED BLOOD CELLS: 0 % (ref 0.0–0.2)
BKR WAM PLATELETS: 325 x1000/ÂµL (ref 140–446)
BKR WAM RDW-CV: 12.7 % (ref 11.5–14.5)
BKR WAM RED BLOOD CELL COUNT: 4.2 M/ÂµL — ABNORMAL LOW (ref 4.6–6.1)
BKR WAM WHITE BLOOD CELL COUNT: 14.4 x1000/ÂµL — ABNORMAL HIGH (ref 3.8–10.6)

## 2020-02-22 LAB — PROTIME AND INR
BKR INR: 1.02 (ref 0.88–1.14)
BKR PROTHROMBIN TIME: 11.2 seconds (ref 9.4–12.0)

## 2020-02-26 ENCOUNTER — Other Ambulatory Visit: Admit: 2020-02-26 | Payer: PRIVATE HEALTH INSURANCE | Attending: Internal Medicine | Primary: Internal Medicine

## 2020-02-26 DIAGNOSIS — Z7689 Persons encountering health services in other specified circumstances: Secondary | ICD-10-CM

## 2020-02-26 LAB — PROTIME AND INR
BKR INR: 1.05 (ref 0.88–1.14)
BKR PROTHROMBIN TIME: 11.5 seconds (ref 9.4–12.0)

## 2020-02-27 ENCOUNTER — Other Ambulatory Visit: Admit: 2020-02-27 | Payer: PRIVATE HEALTH INSURANCE | Attending: Internal Medicine | Primary: Internal Medicine

## 2020-02-27 DIAGNOSIS — Z7689 Persons encountering health services in other specified circumstances: Secondary | ICD-10-CM

## 2020-02-27 LAB — CBC WITH AUTO DIFFERENTIAL
BKR WAM ABSOLUTE IMMATURE GRANULOCYTES: 0.2 x 1000/ÂµL (ref 0.0–0.2)
BKR WAM ABSOLUTE LYMPHOCYTE COUNT: 2 x 1000/ÂµL (ref 1.0–2.3)
BKR WAM ABSOLUTE NRBC: 0 x 1000/ÂµL (ref <=0.0)
BKR WAM ANALYZER ANC: 7.4 x 1000/ÂµL — ABNORMAL HIGH (ref 1.8–7.3)
BKR WAM BASOPHIL ABSOLUTE COUNT: 0.1 x 1000/ÂµL (ref 0.0–0.6)
BKR WAM BASOPHILS: 0.6 % (ref 0.0–2.0)
BKR WAM EOSINOPHIL ABSOLUTE COUNT: 0.1 x 1000/ÂµL (ref 0.0–0.4)
BKR WAM EOSINOPHILS: 1 % (ref 0.0–6.0)
BKR WAM HEMATOCRIT: 38.4 % — ABNORMAL LOW (ref 41.0–54.0)
BKR WAM HEMOGLOBIN: 12.5 g/dL — ABNORMAL LOW (ref 13.7–18.0)
BKR WAM IMMATURE GRANULOCYTES: 1.6 % (ref 0.0–2.0)
BKR WAM LYMPHOCYTES: 18.1 % (ref 14.0–43.0)
BKR WAM MCH (PG): 30.3 pg (ref 25.7–31.0)
BKR WAM MCHC: 32.6 g/dL (ref 32.0–36.0)
BKR WAM MCV: 93 fL (ref 80.0–99.0)
BKR WAM MONOCYTE ABSOLUTE COUNT: 1.2 x 1000/ÂµL (ref 0.4–1.3)
BKR WAM MONOCYTES: 10.9 % (ref 0.0–14.0)
BKR WAM MPV: 9 fL — ABNORMAL LOW (ref 9.8–12.3)
BKR WAM NEUTROPHILS: 67.8 % (ref 38.0–74.0)
BKR WAM NUCLEATED RED BLOOD CELLS: 0 % (ref 0.0–0.2)
BKR WAM PLATELETS: 329 x1000/ÂµL (ref 140–446)
BKR WAM RDW-CV: 12.8 % (ref 11.5–14.5)
BKR WAM RED BLOOD CELL COUNT: 4.1 M/ÂµL — ABNORMAL LOW (ref 4.6–6.1)
BKR WAM WHITE BLOOD CELL COUNT: 10.9 x1000/ÂµL — ABNORMAL HIGH (ref 3.8–10.6)

## 2020-02-27 LAB — BASIC METABOLIC PANEL
BKR ANION GAP: 7 (ref 5–18)
BKR BLOOD UREA NITROGEN: 33 mg/dL — ABNORMAL HIGH (ref 8–25)
BKR BUN / CREAT RATIO: 28.9 fL — ABNORMAL HIGH (ref 8.0–25.0)
BKR CALCIUM: 8.8 mg/dL (ref 8.4–10.3)
BKR CHLORIDE: 102 mmol/L — ABNORMAL LOW (ref 95–115)
BKR CO2: 25 mmol/L — ABNORMAL LOW (ref 21–32)
BKR CREATININE: 1.14 mg/dL (ref 0.50–1.30)
BKR EGFR (AFR AMER): >60 mL/min/{1.73_m2} (ref >60)
BKR EGFR (NON AFRICAN AMERICAN): 59 mL/min/{1.73_m2} (ref >60)
BKR GLUCOSE: 88 mg/dL (ref 70–100)
BKR OSMOLALITY CALCULATION: 275 mOsm/kg — ABNORMAL HIGH (ref 275–295)
BKR POTASSIUM: 5.1 mmol/L — ABNORMAL HIGH (ref 3.5–5.1)
BKR SODIUM: 134 mmol/L — ABNORMAL LOW (ref 136–145)

## 2020-02-29 ENCOUNTER — Other Ambulatory Visit: Admit: 2020-02-29 | Payer: PRIVATE HEALTH INSURANCE | Attending: Internal Medicine | Primary: Internal Medicine

## 2020-02-29 DIAGNOSIS — Z049 Encounter for examination and observation for unspecified reason: Secondary | ICD-10-CM

## 2020-03-01 LAB — URINALYSIS-MACROSCOPIC W/REFLEX MICROSCOPIC
BKR BILIRUBIN, UA: NEGATIVE
BKR GLUCOSE, UA: NEGATIVE
BKR KETONES, UA: NEGATIVE
BKR LEUKOCYTE ESTERASE, UA: POSITIVE — AB
BKR NITRITE, UA: POSITIVE — AB
BKR PH, UA: 8.5 — ABNORMAL HIGH (ref 5.5–7.5)
BKR SPECIFIC GRAVITY, UA: 1.015 (ref 1.005–1.030)
BKR UROBILINOGEN, UA: <2 EU/dL (ref <=2.0)

## 2020-03-01 LAB — URINE MICROSCOPIC     (BH GH LMW YH)
BKR HYALINE CASTS, UA INSTRUMENT (NUMERIC): 5 /LPF — ABNORMAL HIGH (ref 0–3)
BKR RBC/HPF INSTRUMENT: 5 /HPF — ABNORMAL HIGH (ref 0–2)
BKR WBC/HPF INSTRUMENT: 194 /HPF — ABNORMAL HIGH (ref 0–5)

## 2020-03-02 LAB — URINE CULTURE: BKR URINE CULTURE, ROUTINE: >=100000 — AB

## 2020-03-04 ENCOUNTER — Other Ambulatory Visit: Admit: 2020-03-04 | Payer: PRIVATE HEALTH INSURANCE | Attending: Internal Medicine | Primary: Internal Medicine

## 2020-03-04 DIAGNOSIS — Z7689 Persons encountering health services in other specified circumstances: Secondary | ICD-10-CM

## 2020-03-04 LAB — BASIC METABOLIC PANEL
BKR ANION GAP: 8 (ref 5–18)
BKR BLOOD UREA NITROGEN: 27 mg/dL — ABNORMAL HIGH (ref 8–25)
BKR BUN / CREAT RATIO: 26.7 — ABNORMAL HIGH (ref 8.0–25.0)
BKR CALCIUM: 8.5 mg/dL (ref 8.4–10.3)
BKR CHLORIDE: 106 mmol/L (ref 95–115)
BKR CO2: 22 mmol/L (ref 21–32)
BKR CREATININE: 1.01 mg/dL (ref 0.50–1.30)
BKR EGFR (AFR AMER): >60 mL/min/{1.73_m2} (ref >60)
BKR EGFR (NON AFRICAN AMERICAN): >60 mL/min/{1.73_m2} (ref >60)
BKR GLUCOSE: 96 mg/dL (ref 70–100)
BKR OSMOLALITY CALCULATION: 277 mosm/kg (ref 275–295)
BKR POTASSIUM: 4.4 mmol/L (ref 3.5–5.1)
BKR SODIUM: 136 mmol/L (ref 136–145)

## 2020-03-04 LAB — CBC WITH AUTO DIFFERENTIAL
BKR WAM ABSOLUTE IMMATURE GRANULOCYTES: 0 x 1000/ÂµL (ref 0.0–0.2)
BKR WAM ABSOLUTE LYMPHOCYTE COUNT: 2.2 x 1000/ÂµL (ref 1.0–2.3)
BKR WAM ABSOLUTE NRBC: 0 x 1000/ÂµL (ref <=0.0)
BKR WAM ANALYZER ANC: 6.2 x 1000/ÂµL — ABNORMAL LOW (ref 1.8–7.3)
BKR WAM BASOPHIL ABSOLUTE COUNT: 0 x 1000/ÂµL (ref 0.0–0.6)
BKR WAM BASOPHILS: 0.4 % (ref 0.0–2.0)
BKR WAM EOSINOPHIL ABSOLUTE COUNT: 0.1 x 1000/ÂµL (ref 0.0–0.4)
BKR WAM EOSINOPHILS: 0.8 % (ref 0.0–6.0)
BKR WAM HEMATOCRIT: 37.6 % — ABNORMAL LOW (ref 41.0–54.0)
BKR WAM HEMOGLOBIN: 12 g/dL — ABNORMAL LOW (ref 13.7–18.0)
BKR WAM IMMATURE GRANULOCYTES: 0.4 % (ref 0.0–2.0)
BKR WAM LYMPHOCYTES: 23.2 % (ref 14.0–43.0)
BKR WAM MCH (PG): 29.6 pg (ref 25.7–31.0)
BKR WAM MCHC: 31.9 g/dL — ABNORMAL LOW (ref 32.0–36.0)
BKR WAM MCV: 92.6 fL — ABNORMAL LOW (ref 80.0–99.0)
BKR WAM MONOCYTE ABSOLUTE COUNT: 1 x 1000/ÂµL (ref 0.4–1.3)
BKR WAM MONOCYTES: 10.5 % (ref 0.0–14.0)
BKR WAM MPV: 9.4 fL — ABNORMAL LOW (ref 9.8–12.3)
BKR WAM NEUTROPHILS: 64.7 % (ref 38.0–74.0)
BKR WAM NUCLEATED RED BLOOD CELLS: 0 % (ref 0.0–0.2)
BKR WAM PLATELETS: 296 x1000/ÂµL (ref 140–446)
BKR WAM RDW-CV: 13.2 % (ref 11.5–14.5)
BKR WAM RED BLOOD CELL COUNT: 4.1 M/ÂµL — ABNORMAL LOW (ref 4.6–6.1)
BKR WAM WHITE BLOOD CELL COUNT: 9.5 x1000/ÂµL (ref 3.8–10.6)

## 2020-03-20 ENCOUNTER — Encounter: Admit: 2020-03-20 | Payer: PRIVATE HEALTH INSURANCE | Attending: Internal Medicine | Primary: Internal Medicine

## 2020-03-20 DIAGNOSIS — S42202A Unspecified fracture of upper end of left humerus, initial encounter for closed fracture: Secondary | ICD-10-CM

## 2020-03-20 MED ORDER — TRAMADOL 50 MG TABLET
50 mg | ORAL_TABLET | Freq: Four times a day (QID) | ORAL | 1 refills | Status: AC | PRN
Start: 2020-03-20 — End: 2020-04-05

## 2020-03-25 ENCOUNTER — Other Ambulatory Visit: Admit: 2020-03-25 | Payer: PRIVATE HEALTH INSURANCE | Attending: Internal Medicine | Primary: Internal Medicine

## 2020-03-25 DIAGNOSIS — Z7689 Persons encountering health services in other specified circumstances: Secondary | ICD-10-CM

## 2020-03-25 LAB — CBC WITH AUTO DIFFERENTIAL
BKR WAM ABSOLUTE IMMATURE GRANULOCYTES: 0 x 1000/ÂµL — ABNORMAL LOW (ref 0.0–0.2)
BKR WAM ABSOLUTE LYMPHOCYTE COUNT: 2.8 x 1000/ÂµL — ABNORMAL HIGH (ref 1.0–2.3)
BKR WAM ABSOLUTE NRBC: 0 x 1000/ÂµL (ref <=0.0)
BKR WAM ANALYZER ANC: 5.3 x 1000/ÂµL (ref 1.8–7.3)
BKR WAM BASOPHIL ABSOLUTE COUNT: 0 x 1000/ÂµL (ref 0.0–0.6)
BKR WAM BASOPHILS: 0.3 % (ref 0.0–2.0)
BKR WAM EOSINOPHIL ABSOLUTE COUNT: 0.2 x 1000/ÂµL (ref 0.0–0.4)
BKR WAM EOSINOPHILS: 1.7 % (ref 0.0–6.0)
BKR WAM HEMATOCRIT: 36.1 % — ABNORMAL LOW (ref 41.0–54.0)
BKR WAM HEMOGLOBIN: 11.5 g/dL — ABNORMAL LOW (ref 13.7–18.0)
BKR WAM IMMATURE GRANULOCYTES: 0.4 % (ref 0.0–2.0)
BKR WAM LYMPHOCYTES: 30.1 % (ref 14.0–43.0)
BKR WAM MCH (PG): 29.7 pg (ref 25.7–31.0)
BKR WAM MCHC: 31.9 g/dL — ABNORMAL LOW (ref 32.0–36.0)
BKR WAM MCV: 93.3 fL (ref 80.0–99.0)
BKR WAM MONOCYTE ABSOLUTE COUNT: 0.9 x 1000/ÂµL (ref 0.4–1.3)
BKR WAM MONOCYTES: 9.4 % (ref 0.0–14.0)
BKR WAM MPV: 9.4 fL — ABNORMAL LOW (ref 9.8–12.3)
BKR WAM NEUTROPHILS: 58.1 % (ref 38.0–74.0)
BKR WAM NUCLEATED RED BLOOD CELLS: 0 % (ref 0.0–0.2)
BKR WAM PLATELETS: 220 x1000/ÂµL — ABNORMAL LOW (ref 140–446)
BKR WAM RDW-CV: 13.7 % (ref 11.5–14.5)
BKR WAM RED BLOOD CELL COUNT: 3.9 M/ÂµL — ABNORMAL LOW (ref 4.6–6.1)
BKR WAM WHITE BLOOD CELL COUNT: 9.2 x1000/ÂµL (ref 3.8–10.6)

## 2020-03-25 LAB — COMPREHENSIVE METABOLIC PANEL
BKR A/G RATIO: 0.8 x 1000/ÂµL (ref 0.0–0.4)
BKR ALANINE AMINOTRANSFERASE (ALT): 15 U/L (ref 12–78)
BKR ALBUMIN: 2.6 g/dL — ABNORMAL LOW (ref 3.4–5.0)
BKR ALKALINE PHOSPHATASE: 106 U/L (ref 20–135)
BKR ANION GAP: 11 (ref 5–18)
BKR ASPARTATE AMINOTRANSFERASE (AST): 14 U/L (ref 5–37)
BKR AST/ALT RATIO: 0.9 % (ref 0.0–2.0)
BKR BILIRUBIN TOTAL: 0.4 mg/dL (ref 0.0–1.0)
BKR BLOOD UREA NITROGEN: 16 mg/dL (ref 8–25)
BKR BUN / CREAT RATIO: 18.8 fL — ABNORMAL LOW (ref 8.0–25.0)
BKR CALCIUM: 8.2 mg/dL — ABNORMAL LOW (ref 8.4–10.3)
BKR CHLORIDE: 100 mmol/L (ref 95–115)
BKR CO2: 25 mmol/L (ref 21–32)
BKR CREATININE: 0.85 mg/dL (ref 0.50–1.30)
BKR EGFR (AFR AMER): >60 mL/min/{1.73_m2} (ref >60)
BKR EGFR (NON AFRICAN AMERICAN): >60 mL/min/{1.73_m2} (ref >60)
BKR GLOBULIN: 3.3 g/dL (ref 0.0–6.0)
BKR GLUCOSE: 72 mg/dL (ref 70–100)
BKR OSMOLALITY CALCULATION: 272 mosm/kg — ABNORMAL LOW (ref 275–295)
BKR POTASSIUM: 4 mmol/L (ref 3.5–5.1)
BKR PROTEIN TOTAL: 5.9 g/dL — ABNORMAL LOW (ref 6.4–8.2)
BKR SODIUM: 136 mmol/L (ref 136–145)

## 2020-04-05 ENCOUNTER — Encounter: Admit: 2020-04-05 | Payer: PRIVATE HEALTH INSURANCE | Attending: Internal Medicine | Primary: Internal Medicine

## 2020-04-05 MED ORDER — FINASTERIDE 5 MG TABLET
5 mg | ORAL_TABLET | Freq: Every day | ORAL | 6 refills | Status: SS
Start: 2020-04-05 — End: 2022-01-08

## 2020-04-05 MED ORDER — AZELASTINE 0.05 % EYE DROPS
0.05 % | Freq: Every day | OPHTHALMIC | 6 refills | Status: SS
Start: 2020-04-05 — End: 2022-01-08

## 2020-04-05 MED ORDER — OLANZAPINE 2.5 MG TABLET
2.5 mg | ORAL_TABLET | Freq: Every evening | ORAL | 6 refills | Status: SS
Start: 2020-04-05 — End: 2022-01-04

## 2020-04-05 MED ORDER — LEVOTHYROXINE 175 MCG TABLET
175 mcg | ORAL_TABLET | Freq: Every morning | ORAL | 6 refills | Status: SS
Start: 2020-04-05 — End: 2021-12-17

## 2020-04-05 MED ORDER — METOPROLOL SUCCINATE ER 25 MG TABLET,EXTENDED RELEASE 24 HR
25 mg | ORAL_TABLET | Freq: Every day | ORAL | 6 refills | Status: SS
Start: 2020-04-05 — End: 2022-01-08

## 2020-04-05 MED ORDER — GUAIFENESIN 100 MG/5 ML ORAL LIQUID
100 mg/5 mL | ORAL | 6 refills | Status: SS | PRN
Start: 2020-04-05 — End: 2021-05-29

## 2020-04-05 MED ORDER — TAMSULOSIN 0.4 MG CAPSULE
0.4 mg | ORAL_CAPSULE | Freq: Every evening | ORAL | 6 refills | Status: AC
Start: 2020-04-05 — End: 2021-07-29

## 2020-04-05 MED ORDER — TRAMADOL 50 MG TABLET
50 mg | ORAL_TABLET | Freq: Four times a day (QID) | ORAL | 1 refills | Status: SS | PRN
Start: 2020-04-05 — End: 2021-05-29

## 2020-04-05 MED ORDER — TIMOLOL MALEATE 0.5 % EYE DROPS
0.5 % | Freq: Every day | OPHTHALMIC | 6 refills | Status: SS
Start: 2020-04-05 — End: 2022-01-08

## 2020-04-05 MED ORDER — CHOLECALCIFEROL (VITAMIN D3) 125 MCG (5,000 UNIT) TABLET
125 mcg (5,000 unit) | ORAL_TABLET | Freq: Every day | ORAL | 6 refills | Status: SS
Start: 2020-04-05 — End: 2021-05-29

## 2020-04-05 MED ORDER — PRAVASTATIN 20 MG TABLET
20 mg | ORAL_TABLET | ORAL | 6 refills | Status: AC
Start: 2020-04-05 — End: ?

## 2020-04-05 MED ORDER — TRAVOPROST 0.004 % EYE DROPS
0.004 % | Freq: Every day | OPHTHALMIC | 6 refills | Status: AC
Start: 2020-04-05 — End: 2021-07-29

## 2020-04-05 MED ORDER — ALUMINUM-MAG HYDROXIDE-SIMETHICONE 200 MG-200 MG-20 MG/5 ML ORAL SUSP
200-200-20 mg/5 mL | Freq: Four times a day (QID) | ORAL | 6 refills | Status: SS | PRN
Start: 2020-04-05 — End: 2021-05-29

## 2020-04-05 MED ORDER — MELATONIN 1 MG TABLET
1 mg | ORAL_TABLET | Freq: Every evening | ORAL | 6 refills | Status: SS
Start: 2020-04-05 — End: 2021-05-29

## 2020-04-05 MED ORDER — OMEPRAZOLE 20 MG CAPSULE,DELAYED RELEASE
20 mg | ORAL_CAPSULE | Freq: Two times a day (BID) | ORAL | 6 refills | Status: SS
Start: 2020-04-05 — End: 2022-01-04

## 2020-04-05 MED ORDER — MAGNESIUM HYDROXIDE 400 MG/5 ML ORAL SUSPENSION
400 mg/5 mL | Freq: Every day | ORAL | 6 refills | Status: SS | PRN
Start: 2020-04-05 — End: 2021-05-29

## 2020-04-05 MED ORDER — POLYETHYLENE GLYCOL 3350 17 GRAM ORAL POWDER PACKET
17 gram | Freq: Every day | ORAL | 6 refills | Status: SS
Start: 2020-04-05 — End: 2022-01-04

## 2020-04-05 MED ORDER — ACETAMINOPHEN 325 MG TABLET
325 mg | ORAL_TABLET | Freq: Four times a day (QID) | ORAL | 6 refills | Status: SS | PRN
Start: 2020-04-05 — End: 2022-01-08

## 2020-04-17 ENCOUNTER — Other Ambulatory Visit: Admit: 2020-04-17 | Payer: PRIVATE HEALTH INSURANCE | Attending: Internal Medicine | Primary: Internal Medicine

## 2020-04-17 DIAGNOSIS — N390 Urinary tract infection, site not specified: Secondary | ICD-10-CM

## 2020-04-17 LAB — URINALYSIS-MACROSCOPIC W/REFLEX MICROSCOPIC
BKR BILIRUBIN, UA: NEGATIVE
BKR GLUCOSE, UA: NEGATIVE
BKR KETONES, UA: NEGATIVE
BKR LEUKOCYTE ESTERASE, UA: POSITIVE — AB
BKR NITRITE, UA: NEGATIVE
BKR PH, UA: 7.5 (ref 5.5–7.5)
BKR SPECIFIC GRAVITY, UA: 1.01 (ref 1.005–1.030)
BKR UROBILINOGEN, UA: <2 EU/dL (ref <=2.0)

## 2020-04-17 LAB — URINE MICROSCOPIC     (BH GH LMW YH): BKR HYALINE CASTS, UA INSTRUMENT (NUMERIC): 26 /LPF — ABNORMAL HIGH (ref 0–3)

## 2020-04-20 LAB — URINE CULTURE

## 2020-06-26 ENCOUNTER — Other Ambulatory Visit: Admit: 2020-06-26 | Payer: PRIVATE HEALTH INSURANCE | Attending: Internal Medicine | Primary: Internal Medicine

## 2020-06-26 DIAGNOSIS — N3000 Acute cystitis without hematuria: Secondary | ICD-10-CM

## 2020-06-26 LAB — URINALYSIS-MACROSCOPIC W/REFLEX MICROSCOPIC
BKR BILIRUBIN, UA: NEGATIVE
BKR GLUCOSE, UA: NEGATIVE
BKR KETONES, UA: NEGATIVE
BKR LEUKOCYTE ESTERASE, UA: POSITIVE — AB
BKR NITRITE, UA: POSITIVE — AB
BKR PH, UA: 8.5 — ABNORMAL HIGH (ref 5.5–7.5)
BKR SPECIFIC GRAVITY, UA: 1.018 (ref 1.005–1.030)
BKR UROBILINOGEN, UA: <2 EU/dL (ref <=2.0)

## 2020-06-26 LAB — URINE MICROSCOPIC     (BH GH LMW YH)
BKR HYALINE CASTS, UA INSTRUMENT (NUMERIC): 1560 /LPF — ABNORMAL HIGH (ref 0–3)
BKR RBC/HPF INSTRUMENT: 2 /HPF — ABNORMAL HIGH (ref 0–2)
BKR WBC/HPF INSTRUMENT: 120 /HPF — ABNORMAL HIGH (ref 0–5)

## 2020-06-28 LAB — URINE CULTURE: BKR URINE CULTURE, ROUTINE: >=100000 — AB

## 2021-01-01 ENCOUNTER — Other Ambulatory Visit: Admit: 2021-01-01 | Payer: PRIVATE HEALTH INSURANCE | Attending: Internal Medicine | Primary: Internal Medicine

## 2021-01-01 DIAGNOSIS — R82998 Other abnormal findings in urine: Secondary | ICD-10-CM

## 2021-01-01 LAB — URINE MICROSCOPIC     (BH GH LMW YH): BKR WBC/HPF: >30 /HPF — AB (ref 0–5)

## 2021-01-01 LAB — URINALYSIS-MACROSCOPIC W/REFLEX MICROSCOPIC
BKR BILIRUBIN, UA: NEGATIVE
BKR GLUCOSE, UA: NEGATIVE
BKR KETONES, UA: NEGATIVE
BKR LEUKOCYTE ESTERASE, UA: POSITIVE — AB
BKR NITRITE, UA: NEGATIVE
BKR PH, UA: 7.5 (ref 5.5–7.5)
BKR SPECIFIC GRAVITY, UA: 1.019 (ref 1.005–1.030)
BKR UROBILINOGEN, UA: <2 EU/dL (ref <=2.0)

## 2021-01-02 ENCOUNTER — Other Ambulatory Visit: Admit: 2021-01-02 | Payer: PRIVATE HEALTH INSURANCE | Attending: Internal Medicine | Primary: Internal Medicine

## 2021-01-02 DIAGNOSIS — R82998 Other abnormal findings in urine: Secondary | ICD-10-CM

## 2021-01-03 LAB — URINALYSIS-MACROSCOPIC W/REFLEX MICROSCOPIC
BKR BILIRUBIN, UA: NEGATIVE
BKR GLUCOSE, UA: NEGATIVE
BKR KETONES, UA: NEGATIVE
BKR LEUKOCYTE ESTERASE, UA: NEGATIVE
BKR NITRITE, UA: NEGATIVE
BKR PH, UA: 7.5 (ref 5.5–7.5)
BKR SPECIFIC GRAVITY, UA: 1.014 (ref 1.005–1.030)
BKR UROBILINOGEN, UA: <2 EU/dL (ref <=2.0)

## 2021-01-03 LAB — URINE MICROSCOPIC     (BH GH LMW YH)

## 2021-01-04 LAB — URINE CULTURE: BKR URINE CULTURE, ROUTINE: >=100000 — AB

## 2021-05-27 ENCOUNTER — Inpatient Hospital Stay: Admit: 2021-05-27 | Discharge: 2021-05-29 | Payer: MEDICARE | Attending: Internal Medicine | Primary: Internal Medicine

## 2021-05-27 ENCOUNTER — Encounter: Admit: 2021-05-27 | Payer: PRIVATE HEALTH INSURANCE | Attending: Geriatric Medicine | Primary: Internal Medicine

## 2021-05-27 ENCOUNTER — Emergency Department: Admit: 2021-05-27 | Payer: MEDICARE | Primary: Internal Medicine

## 2021-05-27 DIAGNOSIS — F329 Major depressive disorder, single episode, unspecified: Secondary | ICD-10-CM

## 2021-05-27 DIAGNOSIS — K219 Gastro-esophageal reflux disease without esophagitis: Secondary | ICD-10-CM

## 2021-05-27 DIAGNOSIS — E569 Vitamin deficiency, unspecified: Secondary | ICD-10-CM

## 2021-05-27 DIAGNOSIS — M5417 Radiculopathy, lumbosacral region: Secondary | ICD-10-CM

## 2021-05-27 DIAGNOSIS — I480 Paroxysmal atrial fibrillation: Secondary | ICD-10-CM

## 2021-05-27 DIAGNOSIS — H409 Unspecified glaucoma: Secondary | ICD-10-CM

## 2021-05-27 DIAGNOSIS — R059 Cough, unspecified: Secondary | ICD-10-CM

## 2021-05-27 DIAGNOSIS — I10 Essential (primary) hypertension: Secondary | ICD-10-CM

## 2021-05-27 DIAGNOSIS — M1990 Unspecified osteoarthritis, unspecified site: Secondary | ICD-10-CM

## 2021-05-27 DIAGNOSIS — K589 Irritable bowel syndrome without diarrhea: Secondary | ICD-10-CM

## 2021-05-27 DIAGNOSIS — S72002A Fracture of unspecified part of neck of left femur, initial encounter for closed fracture: Secondary | ICD-10-CM

## 2021-05-27 DIAGNOSIS — E785 Hyperlipidemia, unspecified: Secondary | ICD-10-CM

## 2021-05-27 DIAGNOSIS — M40209 Unspecified kyphosis, site unspecified: Secondary | ICD-10-CM

## 2021-05-27 DIAGNOSIS — J189 Pneumonia, unspecified organism: Secondary | ICD-10-CM

## 2021-05-27 DIAGNOSIS — E039 Hypothyroidism, unspecified: Secondary | ICD-10-CM

## 2021-05-27 DIAGNOSIS — F419 Anxiety disorder, unspecified: Secondary | ICD-10-CM

## 2021-05-27 LAB — CBC WITH AUTO DIFFERENTIAL
BKR WAM ABSOLUTE IMMATURE GRANULOCYTES.: 0.03 x 1000/ÂµL (ref 0.00–0.30)
BKR WAM ABSOLUTE LYMPHOCYTE COUNT.: 2.59 x 1000/ÂµL (ref 0.60–3.70)
BKR WAM ABSOLUTE NRBC (2 DEC): 0 x 1000/ÂµL (ref 0.00–1.00)
BKR WAM ANALYZER ANC: 5.15 x 1000/ÂµL (ref 2.00–7.60)
BKR WAM BASOPHIL ABSOLUTE COUNT.: 0.03 x 1000/ÂµL (ref 0.00–1.00)
BKR WAM BASOPHILS: 0.3 % (ref 0.0–1.4)
BKR WAM EOSINOPHIL ABSOLUTE COUNT.: 0.06 x 1000/ÂµL (ref 0.00–1.00)
BKR WAM EOSINOPHILS: 0.7 % (ref 0.0–5.0)
BKR WAM HEMATOCRIT (2 DEC): 46.7 % (ref 38.50–50.00)
BKR WAM HEMOGLOBIN: 15 g/dL (ref 13.2–17.1)
BKR WAM IMMATURE GRANULOCYTES: 0.3 % (ref 0.0–1.0)
BKR WAM LYMPHOCYTES: 30 % (ref 17.0–50.0)
BKR WAM MCH (PG): 30.1 pg (ref 27.0–33.0)
BKR WAM MCHC: 32.1 g/dL (ref 31.0–36.0)
BKR WAM MCV: 93.6 fL (ref 80.0–100.0)
BKR WAM MONOCYTE ABSOLUTE COUNT.: 0.76 x 1000/ÂµL (ref 0.00–1.00)
BKR WAM MONOCYTES: 8.8 % (ref 4.0–12.0)
BKR WAM MPV: 9.7 fL (ref 8.0–12.0)
BKR WAM NEUTROPHILS: 59.9 % (ref 39.0–72.0)
BKR WAM NUCLEATED RED BLOOD CELLS: 0 % (ref 0.0–1.0)
BKR WAM PLATELETS: 232 x1000/ÂµL (ref 150–420)
BKR WAM RDW-CV: 13.3 % (ref 11.0–15.0)
BKR WAM RED BLOOD CELL COUNT.: 4.99 M/ÂµL (ref 4.00–6.00)
BKR WAM WHITE BLOOD CELL COUNT: 8.6 x1000/ÂµL (ref 4.0–11.0)

## 2021-05-27 LAB — SARS COV-2 (COVID-19) RNA: BKR SARS-COV-2 RNA (COVID-19) (YH): NEGATIVE

## 2021-05-27 LAB — BASIC METABOLIC PANEL
BKR ANION GAP: 4 — ABNORMAL LOW (ref 5–18)
BKR BLOOD UREA NITROGEN: 36 mg/dL — ABNORMAL HIGH (ref 8–25)
BKR BUN / CREAT RATIO: 31 — ABNORMAL HIGH (ref 8.0–25.0)
BKR CALCIUM: 8.9 mg/dL (ref 8.4–10.3)
BKR CHLORIDE: 106 mmol/L (ref 95–115)
BKR CO2: 26 mmol/L (ref 21–32)
BKR CREATININE: 1.16 mg/dL (ref 0.50–1.30)
BKR EGFR (AFR AMER): >60 mL/min/{1.73_m2} (ref >60)
BKR EGFR (NON AFRICAN AMERICAN): 58 mL/min/{1.73_m2} (ref >60)
BKR GLUCOSE: 115 mg/dL — ABNORMAL HIGH (ref 70–100)
BKR OSMOLALITY CALCULATION: 281 mosm/kg (ref 275–295)
BKR POTASSIUM: 5.9 mmol/L — ABNORMAL HIGH (ref 3.5–5.1)
BKR SODIUM: 136 mmol/L (ref 136–145)

## 2021-05-27 LAB — PROCALCITONIN     (BH GH LMW Q YH): BKR PROCALCITONIN: <0.04 ng/mL

## 2021-05-27 LAB — INFLUENZA A+B/RSV BY RT-PCR
BKR INFLUENZA A: NEGATIVE
BKR INFLUENZA B: NEGATIVE
BKR RESPIRATORY SYNCYTIAL VIRUS: NEGATIVE

## 2021-05-27 MED ORDER — TIMOLOL MALEATE 0.5 % EYE DROPS
0.5 % | Freq: Every day | OPHTHALMIC | Status: DC
Start: 2021-05-27 — End: 2021-05-29
  Administered 2021-05-28 – 2021-05-29 (×2): 0.5 mL via OPHTHALMIC

## 2021-05-27 MED ORDER — CEFTRIAXONE IV PUSH 1000 MG VIAL & NS (ADULTS)
INTRAVENOUS | Status: DC
Start: 2021-05-27 — End: 2021-05-28

## 2021-05-27 MED ORDER — OLANZAPINE 2.5 MG TABLET
2.5 mg | Freq: Every evening | ORAL | Status: DC
Start: 2021-05-27 — End: 2021-05-29
  Administered 2021-05-29: 01:00:00 2.5 mg via ORAL

## 2021-05-27 MED ORDER — KETOTIFEN 0.025 % (0.035 %) EYE DROPS
0.0250.035 % (0.035 %) | Freq: Two times a day (BID) | OPHTHALMIC | Status: DC
Start: 2021-05-27 — End: 2021-05-29
  Administered 2021-05-28 – 2021-05-29 (×3): 0.025 mL via OPHTHALMIC

## 2021-05-27 MED ORDER — SODIUM CHLORIDE 0.9 % INTRAVENOUS SOLUTION
INTRAVENOUS | Status: DC
Start: 2021-05-27 — End: 2021-05-29
  Administered 2021-05-28 (×2): via INTRAVENOUS

## 2021-05-27 MED ORDER — METOPROLOL SUCCINATE ER (TOPROL) 12.5 MG HALFTAB
12.5 mg | Freq: Every day | ORAL | Status: DC
Start: 2021-05-27 — End: 2021-05-29
  Administered 2021-05-28 – 2021-05-29 (×2): 12.5 mg via ORAL

## 2021-05-27 MED ORDER — AZITHROMYCIN 250 MG TABLET
250 mg | ORAL | Status: DC
Start: 2021-05-27 — End: 2021-05-28

## 2021-05-27 MED ORDER — FINASTERIDE 5 MG TABLET
5 mg | Freq: Every day | ORAL | Status: DC
Start: 2021-05-27 — End: 2021-05-29
  Administered 2021-05-28 – 2021-05-29 (×2): 5 mg via ORAL

## 2021-05-27 MED ORDER — TAMSULOSIN 0.4 MG CAPSULE
0.4 mg | Freq: Every evening | ORAL | Status: DC
Start: 2021-05-27 — End: 2021-05-29
  Administered 2021-05-29: 01:00:00 0.4 mg via ORAL

## 2021-05-27 MED ORDER — ACETAMINOPHEN 325 MG TABLET
325 mg | Freq: Four times a day (QID) | ORAL | Status: DC | PRN
Start: 2021-05-27 — End: 2021-05-29
  Administered 2021-05-29: 09:00:00 325 mg via ORAL

## 2021-05-27 MED ORDER — ONDANSETRON 4 MG DISINTEGRATING TABLET
4 mg | Freq: Four times a day (QID) | ORAL | Status: DC | PRN
Start: 2021-05-27 — End: 2021-05-29

## 2021-05-27 MED ORDER — ENOXAPARIN 40 MG/0.4 ML SUBCUTANEOUS SYRINGE
400.4 mg/0.4 mL | SUBCUTANEOUS | Status: DC
Start: 2021-05-27 — End: 2021-05-29
  Administered 2021-05-28 – 2021-05-29 (×2): 40 mg/0.4 mL via SUBCUTANEOUS

## 2021-05-27 MED ORDER — BISACODYL 5 MG TABLET,DELAYED RELEASE
5 mg | Freq: Every day | ORAL | Status: DC | PRN
Start: 2021-05-27 — End: 2021-05-29

## 2021-05-27 MED ORDER — ZZ IMS TEMPLATE
Freq: Every morning | ORAL | Status: DC
Start: 2021-05-27 — End: 2021-05-29
  Administered 2021-05-28 – 2021-05-29 (×2): 175 mcg via ORAL

## 2021-05-27 MED ORDER — ONDANSETRON HCL (PF) 4 MG/2 ML INJECTION SOLUTION
42 mg/2 mL | Freq: Four times a day (QID) | INTRAVENOUS | Status: DC | PRN
Start: 2021-05-27 — End: 2021-05-29

## 2021-05-27 MED ORDER — AZITHROMYCIN 500MG IN 250ML IVPB (VIALMATE)
Freq: Once | INTRAVENOUS | Status: CP
Start: 2021-05-27 — End: ?
  Administered 2021-05-28: 01:00:00 250.000 mL/h via INTRAVENOUS

## 2021-05-27 MED ORDER — LATANOPROST 0.005 % EYE DROPS
0.005 % | Freq: Every evening | OPHTHALMIC | Status: DC
Start: 2021-05-27 — End: 2021-05-29
  Administered 2021-05-29: 02:00:00 0.005 mL via OPHTHALMIC

## 2021-05-27 MED ORDER — CEFTRIAXONE IV PUSH 1000 MG VIAL & NS (ADULTS)
Freq: Once | INTRAVENOUS | Status: CP
Start: 2021-05-27 — End: ?
  Administered 2021-05-28: 10.000 mL via INTRAVENOUS

## 2021-05-27 NOTE — Utilization Review (ED)
UM Status: Meets Observation Status Bibasilar pneumonitis, tachypneic RR 25, started on ceftriaxone and azithromycin.

## 2021-05-27 NOTE — ED Provider Notes
? 2014 CD-Notes ?  [employer] [facility] Emergency Department	Chief Complaint:Chief Complaint Patient presents with ? Cough   Sent in from [facility] for cough x2 weeks with b/l infiltrates. Covid neg x2 home tests. Denies fever/cp/sob. No blood thinners.  ZOX:WRUEAV Keith Strickland, 87 y.o., male, sent to ER from [facility] urgent care for b/l lung infiltrates.  According to his daughter at the Bedside, the patient has been coughing for the past couple weeks.  Coughing seemed to resolved but recurred again yesterday.Was seen at UC this afternoon and was sent to the ED for reported b/l lung infiltrates on Cxray.The patient admits to feeling SOB.  He denies fever, chills, chest pain.   Symptoms were first noticed couple of weeks ago..Severity of dyspnea is mild.Onset of symptoms was sudden.Associated symptoms include nonproductive cough. Symptoms are exacerbated by minimal activity.Historian: patient and familyReview of Systems:Constitution: 	Denies: fever, chills, sweatsENT:		Negative for sore throat, nasal congestionEYES:		Negative for vision changesCARDIO:	Denies: chest pain, orthopnea, edema, palpitationsRESP:		cough, shortness of breath, Denies: hemoptysis, pleuritic chest pain, wheezingGI:		Negative for abdominal pain, nausea, vomiting, diarrheaGU:		Negative for urinary symptomsMUSC:		Negative for muscle pain, joint painSKIN:		Negative for rashNEURO:	Negative for headachePast Medical History:Past Medical History: Diagnosis Date ? A-fib (HC Code)  ? Anxiety  ? Closed displaced fracture of left femoral neck (HC Code)  ? DM (diabetes mellitus) (HC Code)  ? GERD (gastroesophageal reflux disease)  ? GERD (gastroesophageal reflux disease)  ? Glaucoma  ? HTN (hypertension)  ? Hyperlipidemia  ? Hypertension  ? Hypothyroid  ? Hypothyroidism  ? IBS (irritable bowel syndrome)  ? Kyphosis  ? Lumbosacral radiculopathy ? Major depression  ? Osteoarthritis  ? Paroxysmal atrial fibrillation (HC Code)  ? Pneumonia  ? Vitamin deficiency  Past Surgical History: Procedure Laterality Date ? FRACTURE SURGERY   Social History:Social History Tobacco Use ? Smoking status: Never Smoker ? Smokeless tobacco: Never Used Substance Use Topics ? Alcohol use: No ? Drug use: No Family History:Family History Family history unknown: Yes Medications: Medication List  ASK your doctor about these medications  acetaminophen 325 mg tabletCommonly known as: TYLENOLTake 2 tablets (650 mg total) by mouth every 6 (six) hours as needed (Pain/Fever). aluminum-magnesium hydroxide-simethicone 200-200-20 mg/5 mL suspensionCommonly known as: MAALOXTake 30 mLs by mouth every 6 (six) hours as needed for indigestion. azelastine 0.05 % ophthalmic solutionCommonly known as: OPTIVARPlace 1 drop into both eyes daily. bismuth subsalicylate 262 mg/15 mL suspensionCommonly known as: PEPTO BISMOL,KAOPECTATE cholecalciferol (vitamin D3) 125 mcg (5,000 unit) tabletTake 1 tablet (5,000 Units total) by mouth daily. finasteride 5 mg tabletCommonly known as: PROSCARTake 1 tablet (5 mg total) by mouth daily. guaiFENesin 100 mg/5 mL LiqdCommonly known as: Geri-TussinTake 10 mLs (200 mg total) by mouth every 4 (four) hours as needed for cough. levothyroxine 175 mcg tabletCommonly known as: SYNTHROID, LEVOTHROIDTake 1 tablet (175 mcg total) by mouth every morning. magnesium hydroxide 400 mg/5 mL suspensionCommonly known as: MILK OF MAGNESIATake 30 mLs by mouth daily as needed for constipation. melatonin 1 mg tabletTake 1 tablet (1 mg total) by mouth at bedtime. metoprolol succinate XL 25 mg 24 hr tabletCommonly known as: TOPROL-XLTake 0.5 tablets (12.5 mg total) by mouth daily. Take with or immediately following a meal. OLANZapine 2.5 mg tabletCommonly known as: ZyPREXATake 1 tablet (2.5 mg total) by mouth nightly. omeprazole 20 mg capsuleCommonly known as: PriLOSECTake 1 capsule (20 mg total) by mouth 2 times daily (0900, 1700). polyethylene glycol 17 gram packetCommonly known as: MIRALAXTake 1 packet (17 g total) by mouth daily. Mix in 8 ounces of fluid pravastatin 20 mg tabletCommonly known  as: PRAVACHOLTake 1 tablet (20 mg total) by mouth daily with dinner. tamsulosin 0.4 mg 24 hr capsuleCommonly known as: FLOMAXTake 2 capsules (0.8 mg total) by mouth nightly. Take 2 capsules (0.8mg ) by mouth at bedtime. timolol 0.5 % ophthalmic solutionCommonly known as: TIMOPTICPlace 1 drop into both eyes daily. traMADoL 50 mg tabletCommonly known as: ULTRAMTake 1 tablet (50 mg total) by mouth every 6 (six) hours as needed (breakthrough pain). travoprost 0.004 % ophthalmic solutionCommonly known as: Travatan ZPlace 1 drop into both eyes daily.  Allergies as of 05/27/2021 - Review Complete 05/27/2021 Allergen Reaction Noted ? Gluten protein Other (See Comments) 10/29/2013 ? Lactose Other (See Comments) 10/29/2013 ? Avelox [moxifloxacin]  02/16/2016 ? Ciprofloxacin  11/10/2018 ? Nuts [peanut]  02/16/2016 ? Penicillins  10/28/2013 ? Sulfa (sulfonamide antibiotics)  10/28/2013 Physical Exam:  ED Triage Vitals [05/27/21 1244]BP: 112/75Pulse: 64Pulse from  O2 sat: n/aResp: 18Temp: 97.8 ?F (36.6 ?C)Temp src: OralSpO2: 98 %	General Appear:	Elderly M, sitting in stretcher, taEars/Nose/Throat:	pharynx clear, mucous membranes moistNeck:			supple, FROM, no adenopathy, no JVDCardiovascular:	Regular rate and rhythm.  No murmurs.Respiratory:		Bibasilar rales.  No wheezing or rhonchiAbdomen:		soft, non-tender, non-distended, normo-active bowel soundsMusculoskeletal:	no joint tenderness, deformity or swellingSkin:			dry, no rashNeurologic:		alert and oriented Medical Decision Making:Nursing notes were reviewed: YesI reviewed the following historical information: medical recordsOxygen saturation interpretation: 98% RA-normalLaboratory studies: Yes Results for orders placed or performed during the hospital encounter of 05/27/21 SARS CoV-2 (COVID-19) RNA-[facility] ([facility])  Specimen: Nasopharynx; Viral Result Value Ref Range  SARS-CoV-2 RNA (COVID-19)  Negative Negative Influenza A+B/RSV by RT-PCR ([facility])  Specimen: Nasopharynx; Viral Result Value Ref Range  Influenza A Negative Negative  Influenza B Negative Negative  Respiratory Syncytial Virus Negative Negative Procalcitonin     ([facility]) Result Value Ref Range  Procalcitonin <0.04 See Comment ng/mL Basic metabolic panel Result Value Ref Range  Sodium 136 136 - 145 mmol/L  Potassium 5.9 (H) 3.5 - 5.1 mmol/L  Chloride 106 95 - 115 mmol/L  CO2 26 21 - 32 mmol/L  Anion Gap 4 (L) 5 - 18  Glucose 115 (H) 70 - 100 mg/dL  BUN 36 (H) 8 - 25 mg/dL  Creatinine 1.61 0.96 - 1.30 mg/dL  Calcium 8.9 8.4 - 04.5 mg/dL  BUN/Creatinine Ratio 40.9 (H) 8.0 - 25.0  Osmolality Calculation 281 275 - 295 mOsm/kg  eGFR (Afr Amer) >60 >60 mL/min/1.42m2  eGFR (NON African-American) 58 >60 mL/min/1.79m2 CBC auto differential Result Value Ref Range  WBC 8.6 4.0 - 11.0 x1000/?L  RBC 4.99 4.00 - 6.00 M/?L  Hemoglobin 15.0 13.2 - 17.1 g/dL  Hematocrit 81.19 14.78 - 50.00 %  MCV 93.6 80.0 - 100.0 fL  MCH 30.1 27.0 - 33.0 pg  MCHC 32.1 31.0 - 36.0 g/dL  RDW-CV 29.5 62.1 - 30.8 %  Platelets 232 150 - 420 x1000/?L  MPV 9.7 8.0 - 12.0 fL  Neutrophils 59.9 39.0 - 72.0 %  Lymphocytes 30.0 17.0 - 50.0 %  Monocytes 8.8 4.0 - 12.0 %  Eosinophils 0.7 0.0 - 5.0 %  Basophil 0.3 0.0 - 1.4 %  Immature Granulocytes 0.3 0.0 - 1.0 %  nRBC 0.0 0.0 - 1.0 %  ANC(Abs Neutrophil Count) 5.15 2.00 - 7.60 x 1000/?L  Absolute Lymphocyte Count 2.59 0.60 - 3.70 x 1000/?L  Monocyte Absolute Count 0.76 0.00 - 1.00 x 1000/?L  Eosinophil Absolute Count 0.06 0.00 - 1.00 x 1000/?L  Basophil Absolute Count 0.03 0.00 - 1.00 x 1000/?L  Absolute Immature Granulocyte Count 0.03 0.00 - 0.30  x 1000/?L  Absolute nRBC 0.00 0.00 - 1.00 x 1000/?L Radiologic Imaging studies: CXR Final Result 1. Bilateral lower lobe pneumonitis is demonstrated. 2. There is evidence of underlying changes of bronchiectasis and scarring. 3. Cardiomegaly is demonstrated.  Reported and Signed by:  Cheri Guppy, MD   EKG: 	Independently interpreted by ED provider: labsDiscussed patient with another provider: Yes-HabibSummary of ED Vital Signs: Vitals:  05/27/21 1244 05/27/21 1454 05/27/21 1724 BP: 112/75 114/69 (!) 156/83 Pulse: 64 64 88 Resp: 18 18 18  Temp: 97.8 ?F (36.6 ?C) 97.9 ?F (36.6 ?C)  TempSrc: Oral Oral  SpO2: 98% 98% 99% ED Course/Assessment/Plan:The patient presents to the ED with cough for the past 2 weeks.  Cxray demonstratesBibasilar pneumonitis.  He has rales on exam and is tachypneic (RR 25).  GivenCeftriaxone and Azithromycin of presumed CAP.  Will admit to the medical serviceFor IV antibiotics and pulmonary consultation.  ______________________________________________________________________Condition: 	 StableDisposition: 	AdmittedDiagnosis: 	Bibasilar pneumonia?2014 CD-Notes?  [employer] All Rights Reserved; version 2.0; revised November, [email or web address] Laverle Hobby, DO07/26/22 1802

## 2021-05-27 NOTE — H&P
Hospital Medicine History & PhysicalDate of Admission: 7/26/2022Date of Service: 7/26/2022Primary Care Physician: Einar Pheasant Source: Patient's caretaker and his chart. Chief Complaint: Productive cough, progressively worsening for two weeks. History of Present Illness: Mr. Hageman is 87 years old. He has caretakers. One of them is at the bedside and has known him for 13 years. She mentions the patient has had progressively worsening cough for two weeks. Today, it was particularly bad and productive of white to yellow sputum. There are no reported fevers or dyspnea. He was taken to the Urgent Care where chest Xray showed basilar infiltrates. He was transferred to the ED. Past Medical & Surgical History:Past Medical History: Diagnosis Date ? Anxiety  ? Closed displaced fracture of left femoral neck (HC Code) (HC CODE) (HC Code)  ? GERD (gastroesophageal reflux disease)  ? Glaucoma  ? Hyperlipidemia  ? Hypertension  ? Hypothyroidism  ? IBS (irritable bowel syndrome)  ? Kyphosis  ? Lumbosacral radiculopathy  ? Major depression  ? Osteoarthritis  ? Paroxysmal atrial fibrillation (HC Code) (HC CODE) (HC Code)  ? Pneumonia  ? Vitamin deficiency  Past Surgical History: Procedure Laterality Date ? FRACTURE SURGERY   Medication Reconciliation: CompletedMedications:No current facility-administered medications on file prior to encounter. Current Outpatient Medications on File Prior to Encounter Medication Sig Dispense Refill ? acetaminophen (TYLENOL) 325 mg tablet Take 2 tablets (650 mg total) by mouth every 6 (six) hours as needed (Pain/Fever). 100 tablet 5 ? aluminum-magnesium hydroxide-simethicone (MAALOX) 200-200-20 mg/5 mL suspension Take 30 mLs by mouth every 6 (six) hours as needed for indigestion. 150 mL 5 ? azelastine (OPTIVAR) 0.05 % ophthalmic solution Place 1 drop into both eyes daily. 6 mL 5 ? bismuth subsalicylate (PEPTO BISMOL,KAOPECTATE) 262 mg/15 mL suspension Take 30 mLs by mouth every 6 (six) hours as needed (Diarrhea).   ? cholecalciferol, vitamin D3, 125 mcg (5,000 unit) tablet Take 1 tablet (5,000 Units total) by mouth daily. 30 tablet 5 ? finasteride (PROSCAR) 5 mg tablet Take 1 tablet (5 mg total) by mouth daily. 30 tablet 5 ? guaiFENesin (GERI-TUSSIN) 100 mg/5 mL Liqd Take 10 mLs (200 mg total) by mouth every 4 (four) hours as needed for cough. 180 mL 5 ? levothyroxine (SYNTHROID, LEVOTHROID) 175 mcg tablet Take 1 tablet (175 mcg total) by mouth every morning. 30 tablet 5 ? magnesium hydroxide (MILK OF MAGNESIA) 400 mg/5 mL suspension Take 30 mLs by mouth daily as needed for constipation. 354 mL 5 ? melatonin 1 mg tablet Take 1 tablet (1 mg total) by mouth at bedtime. 30 tablet 5 ? metoprolol succinate XL (TOPROL-XL) 25 mg 24 hr tablet Take 0.5 tablets (12.5 mg total) by mouth daily. Take with or immediately following a meal. 15 tablet 5 ? OLANZapine (ZYPREXA) 2.5 mg tablet Take 1 tablet (2.5 mg total) by mouth nightly. 30 tablet 5 ? omeprazole (PRILOSEC) 20 mg capsule Take 1 capsule (20 mg total) by mouth 2 times daily (0900, 1700). 30 capsule 5 ? polyethylene glycol (MIRALAX) 17 gram packet Take 1 packet (17 g total) by mouth daily. Mix in 8 ounces of fluid 30 each 5 ? pravastatin (PRAVACHOL) 20 mg tablet Take 1 tablet (20 mg total) by mouth daily with dinner. 30 tablet 5 ? tamsulosin (FLOMAX) 0.4 mg 24 hr capsule Take 2 capsules (0.8 mg total) by mouth nightly. Take 2 capsules (0.8mg ) by mouth at bedtime. 60 capsule 5 ? timolol (TIMOPTIC) 0.5 % ophthalmic solution Place 1 drop into both eyes daily. 10 mL 5 ? traMADoL (ULTRAM) 50 mg  tablet Take 1 tablet (50 mg total) by mouth every 6 (six) hours as needed (breakthrough pain). 30 tablet 0 ? travoprost (TRAVATAN Z) 0.004 % ophthalmic solution Place 1 drop into both eyes daily. 5 mL 5 Family History: Reviewed and non-contributory. Family History Family history unknown: Yes Social History: Social History Socioeconomic History ? Marital status: Widowed Tobacco Use ? Smoking status: Never Smoker ? Smokeless tobacco: Never Used Substance and Sexual Activity ? Alcohol use: No ? Drug use: No Review of Systems: Pertinent positives and negatives mentioned above in History of Present Illness. All other systems reviewed and were negative. Physical Examination:Vitals:  05/27/21 1244 05/27/21 1454 05/27/21 1724 BP: 112/75 114/69 (!) 156/83 Pulse: 64 64 88 Resp: 18 18 18  Temp: 97.8 ?F (36.6 ?C) 97.9 ?F (36.6 ?C)  TempSrc: Oral Oral  SpO2: 98% 98% 99% General: Awake and alert. No distress. Oriented. Keeps eyes closed. A bit tachypneic. HEENT: Atraumatic. No pallor or icterus. Neck: Supple. Respiratory: Lungs with basilar crackles bilaterally. Cardiovascular: No JVD. Heart examination reveals S1 + S2, regular rate and rhythm. Gastrointestinal: Abdominal examination reveals a soft abdomen. It is non-tender and non-distended. Bowel sounds are normal. No organomegaly. Extremities: No lower extremity edema bilaterally.Neurologic: Non-focal examination. Labs and imaging studies reviewed. Recent Results (from the past 24 hour(s)) Blood culture  Collection Time: 05/27/21  4:05 PM  Specimen: Peripheral; Blood Result Value Ref Range  Blood Culture No Growth to Date  Blood culture  Collection Time: 05/27/21  4:05 PM  Specimen: Peripheral; Blood Result Value Ref Range  Blood Culture No Growth to Date  Procalcitonin     (BH GH LMW Q YH)  Collection Time: 05/27/21  4:05 PM Result Value Ref Range  Procalcitonin <0.04 See Comment ng/mL SARS CoV-2 (COVID-19) RNA-[facility] LM[facility] Y[facility])  Collection Time: 05/27/21  4:05 PM  Specimen: Nasopharynx; Viral Result Value Ref Range  SARS-CoV-2 RNA (COVID-19)  Negative Negative Influenza A+B/RSV by RT-PCR (BH GH LMW YH)  Collection Time: 05/27/21  4:05 PM  Specimen: Nasopharynx; Viral Result Value Ref Range  Influenza A Negative Negative  Influenza B Negative Negative  Respiratory Syncytial Virus Negative Negative Basic metabolic panel  Collection Time: 05/27/21  4:05 PM Result Value Ref Range  Sodium 136 136 - 145 mmol/L  Potassium 5.9 (H) 3.5 - 5.1 mmol/L  Chloride 106 95 - 115 mmol/L  CO2 26 21 - 32 mmol/L  Anion Gap 4 (L) 5 - 18  Glucose 115 (H) 70 - 100 mg/dL  BUN 36 (H) 8 - 25 mg/dL  Creatinine 1.61 0.96 - 1.30 mg/dL  Calcium 8.9 8.4 - 04.5 mg/dL  BUN/Creatinine Ratio 40.9 (H) 8.0 - 25.0  Osmolality Calculation 281 275 - 295 mOsm/kg  eGFR (Afr Amer) >60 >60 mL/min/1.20m2  eGFR (NON African-American) 58 >60 mL/min/1.50m2 CBC auto differential  Collection Time: 05/27/21  4:05 PM Result Value Ref Range  WBC 8.6 4.0 - 11.0 x1000/?L  RBC 4.99 4.00 - 6.00 M/?L  Hemoglobin 15.0 13.2 - 17.1 g/dL  Hematocrit 81.19 14.78 - 50.00 %  MCV 93.6 80.0 - 100.0 fL  MCH 30.1 27.0 - 33.0 pg  MCHC 32.1 31.0 - 36.0 g/dL  RDW-CV 29.5 62.1 - 30.8 %  Platelets 232 150 - 420 x1000/?L  MPV 9.7 8.0 - 12.0 fL  Neutrophils 59.9 39.0 - 72.0 %  Lymphocytes 30.0 17.0 - 50.0 %  Monocytes 8.8 4.0 - 12.0 %  Eosinophils 0.7 0.0 - 5.0 %  Basophil 0.3 0.0 - 1.4 %  Immature Granulocytes 0.3 0.0 - 1.0 %  nRBC 0.0 0.0 - 1.0 %  ANC(Abs Neutrophil Count) 5.15 2.00 - 7.60 x 1000/?L  Absolute Lymphocyte Count 2.59 0.60 - 3.70 x 1000/?L  Monocyte Absolute Count 0.76 0.00 - 1.00 x 1000/?L  Eosinophil Absolute Count 0.06 0.00 - 1.00 x 1000/?L  Basophil Absolute Count 0.03 0.00 - 1.00 x 1000/?L  Absolute Immature Granulocyte Count 0.03 0.00 - 0.30 x 1000/?L  Absolute nRBC 0.00 0.00 - 1.00 x 1000/?L Imaging Studies:CXRResult Date: 7/26/20221. Bilateral lower lobe pneumonitis is demonstrated. 2. There is evidence of underlying changes of bronchiectasis and scarring. 3. Cardiomegaly is demonstrated. Reported and Signed by:  Cheri Guppy, MD Assessment: A 87 year old gentleman with history of hypertension, hyperlipidemia, paroxysmal atrial fibrillation not on anticoagulation, BPH, and hypothyroidism who has presented with bibasilar pneumonitis. He received Ceftriaxone and Azithromycin in the ED. He appears to be volume depleted. Episode of Care Diagnosis/Problem List  Diagnosis ? Pneumonia of both lower lobes due to infectious organism [J18.9] ? AKI (acute kidney injury) (HC Code) (HC CODE) (HC Code) [N17.9] ? Hyperkalemia [E87.5] ? BPH (benign prostatic hyperplasia) [N40.0] ? Hypothyroidism [E03.9] ? HLD (hyperlipidemia) [E78.5] ? Essential hypertension [I10] Plan:?	Continue Ceftriaxone and Azithromycin. ?	Check viral respiratory panel. ?	Check BNP. ?	IV hydration. Monitor renal function and potassium. ?	Continue home medicines. Venous Thromboembolism Prophylaxis: SCDs and EnoxaparinTelemetry: NoFoley Catheter: NoCode Status: DNRDisposition: Expect stay in the hospital for a couple of days. Sign out left for [facility], who will assume patient's care from tomorrow. Janina Mayo, MDHospital MedicineGreenwich Hospital[address] [place], [place] [address]UEAVWU Phone: [phone]: [phone]

## 2021-05-28 ENCOUNTER — Ambulatory Visit: Admit: 2021-05-28 | Payer: MEDICARE | Primary: Internal Medicine

## 2021-05-28 DIAGNOSIS — R059 Cough, unspecified: Secondary | ICD-10-CM

## 2021-05-28 LAB — BASIC METABOLIC PANEL
BKR ANION GAP: 3 — ABNORMAL LOW (ref 5–18)
BKR BLOOD UREA NITROGEN: 29 mg/dL — ABNORMAL HIGH (ref 8–25)
BKR BUN / CREAT RATIO: 27.6 — ABNORMAL HIGH (ref 8.0–25.0)
BKR CALCIUM: 8.5 mg/dL (ref 8.4–10.3)
BKR CHLORIDE: 108 mmol/L (ref 95–115)
BKR CO2: 28 mmol/L (ref 21–32)
BKR CREATININE: 1.05 mg/dL (ref 0.50–1.30)
BKR EGFR (AFR AMER): >60 mL/min/{1.73_m2} (ref >60)
BKR EGFR (NON AFRICAN AMERICAN): >60 mL/min/{1.73_m2} (ref >60)
BKR GLUCOSE: 90 mg/dL (ref 70–100)
BKR OSMOLALITY CALCULATION: 283 mosm/kg (ref 275–295)
BKR POTASSIUM: 5.3 mmol/L — ABNORMAL HIGH (ref 3.5–5.1)
BKR SODIUM: 139 mmol/L (ref 136–145)

## 2021-05-28 LAB — RESPIRATORY VIRUS PCR PANEL     (BH GH LMW Q YH)
BKR ADENOVIRUS: NOT DETECTED
BKR HUMAN METAPNEUMOVIRUS (HMPV): NOT DETECTED
BKR PARAINFLUENZA VIRUS 1: NOT DETECTED
BKR PARAINFLUENZA VIRUS 2: NOT DETECTED
BKR PARAINFLUENZA VIRUS 3: NOT DETECTED
BKR PARAINFLUENZA VIRUS 4: NOT DETECTED
BKR RHINOVIRUS: NOT DETECTED

## 2021-05-28 LAB — CBC WITH AUTO DIFFERENTIAL
BKR WAM ABSOLUTE IMMATURE GRANULOCYTES.: 0.03 x 1000/ÂµL (ref 0.00–0.30)
BKR WAM ABSOLUTE LYMPHOCYTE COUNT.: 3.04 x 1000/ÂµL (ref 0.60–3.70)
BKR WAM ABSOLUTE NRBC (2 DEC): 0 x 1000/ÂµL (ref 0.00–1.00)
BKR WAM ANALYZER ANC: 4.58 x 1000/ÂµL (ref 2.00–7.60)
BKR WAM BASOPHIL ABSOLUTE COUNT.: 0.03 x 1000/ÂµL (ref 0.00–1.00)
BKR WAM BASOPHILS: 0.3 % (ref 0.0–1.4)
BKR WAM EOSINOPHIL ABSOLUTE COUNT.: 0.12 x 1000/ÂµL (ref 0.00–1.00)
BKR WAM EOSINOPHILS: 1.4 % (ref 0.0–5.0)
BKR WAM HEMATOCRIT (2 DEC): 39.9 % (ref 38.50–50.00)
BKR WAM HEMOGLOBIN: 12.6 g/dL — ABNORMAL LOW (ref 13.2–17.1)
BKR WAM IMMATURE GRANULOCYTES: 0.3 % (ref 0.0–1.0)
BKR WAM LYMPHOCYTES: 35.2 % (ref 17.0–50.0)
BKR WAM MCH (PG): 29.8 pg (ref 27.0–33.0)
BKR WAM MCHC: 31.6 g/dL (ref 31.0–36.0)
BKR WAM MCV: 94.3 fL (ref 80.0–100.0)
BKR WAM MONOCYTE ABSOLUTE COUNT.: 0.83 x 1000/ÂµL (ref 0.00–1.00)
BKR WAM MONOCYTES: 9.6 % (ref 4.0–12.0)
BKR WAM MPV: 9.9 fL (ref 8.0–12.0)
BKR WAM NEUTROPHILS: 53.2 % (ref 39.0–72.0)
BKR WAM NUCLEATED RED BLOOD CELLS: 0 % (ref 0.0–1.0)
BKR WAM PLATELETS: 190 x1000/ÂµL (ref 150–420)
BKR WAM RDW-CV: 13.2 % (ref 11.0–15.0)
BKR WAM RED BLOOD CELL COUNT.: 4.23 M/ÂµL (ref 4.00–6.00)
BKR WAM WHITE BLOOD CELL COUNT: 8.6 x1000/ÂµL (ref 4.0–11.0)

## 2021-05-28 LAB — NT-PROBNPE: BKR B-TYPE NATRIURETIC PEPTIDE, PRO (PROBNP): 2719 pg/mL — ABNORMAL HIGH (ref 0.0–1800.0)

## 2021-05-28 MED ORDER — SODIUM CHLORIDE 0.9 % (FLUSH) INJECTION SYRINGE
0.9 % | Freq: Three times a day (TID) | INTRAVENOUS | Status: DC
Start: 2021-05-28 — End: 2021-05-29
  Administered 2021-05-29: 01:00:00 0.9 mL via INTRAVENOUS

## 2021-05-28 MED ORDER — CEFTRIAXONE IV PUSH 1000 MG VIAL & NS (ADULTS)
INTRAVENOUS | Status: DC
Start: 2021-05-28 — End: 2021-05-29
  Administered 2021-05-29: 01:00:00 10.000 mL via INTRAVENOUS

## 2021-05-28 MED ORDER — SODIUM CHLORIDE 0.9 % (FLUSH) INJECTION SYRINGE
0.9 % | INTRAVENOUS | Status: DC | PRN
Start: 2021-05-28 — End: 2021-05-29
  Administered 2021-05-29: 12:00:00 0.9 mL via INTRAVENOUS

## 2021-05-28 MED ORDER — AZITHROMYCIN 250 MG TABLET
250 mg | ORAL | Status: DC
Start: 2021-05-28 — End: 2021-05-29
  Administered 2021-05-29: 01:00:00 250 mg via ORAL

## 2021-05-28 NOTE — Plan of Care
Physical Therapy Evaluation Patient Overview - 05/28/21 1638    Date of Visit / Treatment  Date of Visit / Treatment 05/28/21   Note Type Evaluation   Start Time 3:30pm   Total Treatment Time    Patient Overview  History of Present Illness 87 y.o. M PMHX: A-fib,HTN,BPH, Resident [facility] has 24/7, Admitted 2? PNS   Precautions fall   Social History other (comment)    ALF 24/7 HHA  Prior Level of Function independent with assistive device;independent with ADLs   Subjective I feel ok   General Observations Supine in bed NAD      Assessment - 05/28/21 1638    Assessment  Cognition (Mentation/Communication) within functional limits   Vital Signs vital signs stable   Pain Rating 0   Skin Integrity/Edema see skin documentation   Sensation no complaints   Range of Motion within functional limits   Muscle Strength/Tone within functional limits   Balance no loss of balance   Balance - Additional Details/Comments RW      Functional Mobility - 05/28/21 1638    Functional Mobility  Rolling Contact guard;Assist of 1   Supine to/from Sit Contact guard;Assist of 1   Sit to/from Stand Contact guard;Minimum assist;Assist of 1   Sit to/from Stand Device Actuary Minimum assist   Toilet Transfer Device Rolling walker   Bed to/from Chair Contact guard   Bed to/from Chair Device Rolling walker   Ambulation Contact guard   Ambulation Device Rolling walker   Ambulation Distance 25 feet;x1   Stair Negotiation Not tested   Weight Bearing Restriction with Mobility N/A      AMPAC Basic Mobility - 05/28/21 1638    PT- AM-PAC - Basic Mobility Screen- How much help from another person do you currently need.....  Turning from your back to your side while in a a flat bed without using rails? 3 - A Little - Requires a little help (supervision, minimal assistance). Can use assistive devices.   Moving from lying on your back to sitting on the side of a flat bed without using bed rails? 3 - A Little - Requires a little help (supervision, minimal assistance). Can use assistive devices.   Moving to and from a bed to a chair (including a wheelchair)? 3 - A Little - Requires a little help (supervision, minimal assistance). Can use assistive devices.   Standing up from a chair using your arms(e.g., wheelchair or bedside chair)? 3 - A Little - Requires a little help (supervision, minimal assistance). Can use assistive devices.   To walk in a hospital room? 3 - A Little - Requires a little help (supervision, minimal assistance). Can use assistive devices.   Climbing 3-5 steps with a railing? 1 - Total - Requires total assistance or cannot do it at all.   AMPAC Mobility Score 16   TARGET Highest Level of Mobility Mobility Level 5, Stand for 1 minute      Recommendations for IP Admission - 05/28/21 1638    PT Recommendations for Inpatient Admission  Activity/Level of Assist out of bed;ambulate;assist of 1;contact guard;in room;in hall;with rolling walker   ADL Recommendations assist to bathroom;assist of 1;with rolling walker      Clinical Impression/Recommendation - 05/28/21 1638    Clinical Impression / Recommendation  Initial Assessment 89y.oJudie Petit PMHX; As above . Patient at baseline Recomend D/C home w/24/7 care already . DG:LOVFI aware.RN: Roseanne Reno aware of pt's abilities.   Patient Goal return  home   PT Frequency Cleared   Reason for Discharge (PT) no further needs identified   Physical Therapy Disposition Recommendation Home   Additional Physical Therapy Disposition Recommendations Home with 24 hour assistance   Equipment Recommendations for Discharge Patient has all necessary durable medical equipment      PT Handoff - 05/28/21 1638    Handoff Documentation  Handoff Patient in bed;Bed alarm Handoff Comments RN: Roseanne Reno aware

## 2021-05-28 NOTE — Plan of Care
Plan of Care Overview/ Patient Status    Patient is a 87 years old. He has  private caretakers. She mentions the patient has had progressively worsening cough for two weeks.There are no reported fevers or dyspnea, was taken to the Urgent Care where chest Xray showed basilar infiltrates. He was transferred to the ED. Spoke to the daughter Keith Strickland. Patient is a resident at [facility] with 24/7 care. Verbalizes that she prefers that the patient be discharged today , if medically possible. CM will need to arrange for ambulette on discharge , daughter aware of financial responsibility.CM Assessment and Discharge Planning  Flowsheet Row Most Recent Value Case Management Evaluation and Plan  Arrived from prior to admission home/apartment/condo  Lives With Alone, Other (see comments) Services Prior to Admission none Documented Insurance Accurate Yes Any financial concerns related to anticipated discharge needs No Patient's home address verified No Patient's PCP of record verified Yes Patient has new PCP (specify name). PLEASE UPDATE DEMOGRAPHICS Has a new PCP- Keith Strickland Living [facility]   Lives With Alone, Other (see comments) Source of Clinical History  Patient's clinical history has been reviewed and source of Information is: Child(ren) Case Manager Attestation  I have reviewed the medical record and completed the above evaluation with the following recommendations. Yes  Keith Strickland BSN, RNNurse Case ManagerGreenwich Hospital[address] [place], [place] [address]QIHK: [phone]: [phone]: [phone]**IF I CANNOT BE REACHED PLEASE CALL [phone].**

## 2021-05-28 NOTE — Plan of Care
Plan of Care Overview/ Patient Status    700 - dual RN bedside handoff completed General Appearance: Alert to self, very sleepy abusable to voice or gentle shaking. RA; no signs of respiratory distress. VSS, pain adequately managed at this time. IVF: NS @75ml /hrLungs: diminished, equal bilateral to auscultation.Denies chest pain or SOBAbdomen: soft, non-tender, + bowel sounds in all quadrants, + flatus.GU: incontinent, condom cath in place draining clear yellow urineMobility: OOB rolling walker, assist x1(+) pp, (+) csm.Denies numbness and tingling. Good muscle strength, (+) dorsi/plantarflextionCall bell placed in reach, bed alarm set. Patient informed to call when they need any assistance,All belolngings are within patients reach.Patient/Family acknowledge understanding of fall prevention education including to call nurse with assistance with ambulation

## 2021-05-28 NOTE — Progress Notes
[facility] Medicine Progress NoteAttending Provider: Marshell Levan, MD Subjective                                                                              Subjective: Interim History: no dyspnea, fevers, some cough at times; alert in am, then sleeping at noon then awake again at 1 pmReview of Allergies/Meds/Hx: Review of Allergies/Meds/Hx:I have reviewed the patient's: allergies and current scheduled medications Objective Objective: Vitals:Last 24 hours: Temp:  [96.7 ?F (35.9 ?C)-98.1 ?F (36.7 ?C)] 96.7 ?F (35.9 ?C)Pulse:  [62-88] 74Resp:  [16-20] 18BP: (109-156)/(59-92) 144/92SpO2:  [96 %-99 %] 97 %I/O's:Gross Totals (Last 24 hours) at 05/28/2021 1334Last data filed at 05/27/2021 2221Intake 250 ml Output -- Net 250 ml Procedures:Physical Exam Labs:Last 24 hours: Recent Results (from the past 24 hour(s)) Blood culture  Collection Time: 05/27/21  4:05 PM  Specimen: Peripheral; Blood Result Value Ref Range  Blood Culture Abnormal Stain (AA)   Blood Culture Gram Stain Anaerobic Bottle Gram positive cocci in clusters (AA)  Blood culture  Collection Time: 05/27/21  4:05 PM  Specimen: Peripheral; Blood Result Value Ref Range  Blood Culture No Growth to Date  Procalcitonin     (BH [facility] LMW Q YH)  Collection Time: 05/27/21  4:05 PM Result Value Ref Range  Procalcitonin <0.04 See Comment ng/mL SARS CoV-2 (COVID-19) RNA-[facility] LMW YH)  Collection Time: 05/27/21  4:05 PM  Specimen: Nasopharynx; Viral Result Value Ref Range  SARS-CoV-2 RNA (COVID-19)  Negative Negative Influenza A+B/RSV by RT-PCR (BH GH LMW YH)  Collection Time: 05/27/21  4:05 PM  Specimen: Nasopharynx; Viral Result Value Ref Range  Influenza A Negative Negative  Influenza B Negative Negative  Respiratory Syncytial Virus Negative Negative Basic metabolic panel  Collection Time: 05/27/21  4:05 PM Result Value Ref Range  Sodium 136 136 - 145 mmol/L  Potassium 5.9 (H) 3.5 - 5.1 mmol/L  Chloride 106 95 - 115 mmol/L  CO2 26 21 - 32 mmol/L  Anion Gap 4 (L) 5 - 18  Glucose 115 (H) 70 - 100 mg/dL  BUN 36 (H) 8 - 25 mg/dL  Creatinine 1.61 0.96 - 1.30 mg/dL  Calcium 8.9 8.4 - 04.5 mg/dL  BUN/Creatinine Ratio 40.9 (H) 8.0 - 25.0  Osmolality Calculation 281 275 - 295 mOsm/kg  eGFR (Afr Amer) >60 >60 mL/min/1.58m2  eGFR (NON African-American) 58 >60 mL/min/1.22m2 CBC auto differential  Collection Time: 05/27/21  4:05 PM Result Value Ref Range  WBC 8.6 4.0 - 11.0 x1000/?L  RBC 4.99 4.00 - 6.00 M/?L  Hemoglobin 15.0 13.2 - 17.1 g/dL  Hematocrit 81.19 14.78 - 50.00 %  MCV 93.6 80.0 - 100.0 fL  MCH 30.1 27.0 - 33.0 pg  MCHC 32.1 31.0 - 36.0 g/dL  RDW-CV 29.5 62.1 - 30.8 %  Platelets 232 150 - 420 x1000/?L  MPV 9.7 8.0 - 12.0 fL  Neutrophils 59.9 39.0 - 72.0 %  Lymphocytes 30.0 17.0 - 50.0 %  Monocytes 8.8 4.0 - 12.0 %  Eosinophils 0.7 0.0 - 5.0 %  Basophil 0.3 0.0 - 1.4 %  Immature Granulocytes 0.3 0.0 - 1.0 %  nRBC 0.0 0.0 - 1.0 %  ANC(Abs Neutrophil Count) 5.15 2.00 - 7.60  x 1000/?L  Absolute Lymphocyte Count 2.59 0.60 - 3.70 x 1000/?L  Monocyte Absolute Count 0.76 0.00 - 1.00 x 1000/?L  Eosinophil Absolute Count 0.06 0.00 - 1.00 x 1000/?L  Basophil Absolute Count 0.03 0.00 - 1.00 x 1000/?L  Absolute Immature Granulocyte Count 0.03 0.00 - 0.30 x 1000/?L  Absolute nRBC 0.00 0.00 - 1.00 x 1000/?L Respiratory virus PCR panel     (BH GH LMW YH)  Collection Time: 05/27/21  4:05 PM  Specimen: Nasopharynx; Viral Result Value Ref Range  Adenovirus Not Detected Not Detected  Human Metapneumovirus (HMPV) Not Detected Not Detected  Parainfluenza Virus 1 Not Detected Not Detected  Parainfluenza Virus 2 Not Detected Not Detected  Parainfluenza Virus 3 Not Detected Not Detected  Parainfluenza Virus 4 Not Detected Not Detected  Rhinovirus Not Detected Not Detected NT-proBrain natriuretic peptide  Collection Time: 05/27/21  4:05 PM Result Value Ref Range  B-Type Natriuretic Peptide, Pro (proBNP) 2,719.0 (H) 0.0 - 1,800.0 pg/mL Basic metabolic panel  Collection Time: 05/28/21  5:43 AM Result Value Ref Range  Sodium 139 136 - 145 mmol/L  Potassium 5.3 (H) 3.5 - 5.1 mmol/L  Chloride 108 95 - 115 mmol/L  CO2 28 21 - 32 mmol/L  Anion Gap 3 (L) 5 - 18  Glucose 90 70 - 100 mg/dL  BUN 29 (H) 8 - 25 mg/dL  Creatinine 9.56 2.13 - 1.30 mg/dL  Calcium 8.5 8.4 - 08.6 mg/dL  BUN/Creatinine Ratio 57.8 (H) 8.0 - 25.0  Osmolality Calculation 283 275 - 295 mOsm/kg  eGFR (Afr Amer) >60 >60 mL/min/1.11m2  eGFR (NON African-American) >60 >60 mL/min/1.27m2 CBC auto differential  Collection Time: 05/28/21  5:43 AM Result Value Ref Range  WBC 8.6 4.0 - 11.0 x1000/?L  RBC 4.23 4.00 - 6.00 M/?L  Hemoglobin 12.6 (L) 13.2 - 17.1 g/dL  Hematocrit 46.96 29.52 - 50.00 %  MCV 94.3 80.0 - 100.0 fL  MCH 29.8 27.0 - 33.0 pg  MCHC 31.6 31.0 - 36.0 g/dL  RDW-CV 84.1 32.4 - 40.1 %  Platelets 190 150 - 420 x1000/?L  MPV 9.9 8.0 - 12.0 fL  Neutrophils 53.2 39.0 - 72.0 %  Lymphocytes 35.2 17.0 - 50.0 %  Monocytes 9.6 4.0 - 12.0 %  Eosinophils 1.4 0.0 - 5.0 %  Basophil 0.3 0.0 - 1.4 %  Immature Granulocytes 0.3 0.0 - 1.0 %  nRBC 0.0 0.0 - 1.0 %  ANC(Abs Neutrophil Count) 4.58 2.00 - 7.60 x 1000/?L  Absolute Lymphocyte Count 3.04 0.60 - 3.70 x 1000/?L  Monocyte Absolute Count 0.83 0.00 - 1.00 x 1000/?L  Eosinophil Absolute Count 0.12 0.00 - 1.00 x 1000/?L  Basophil Absolute Count 0.03 0.00 - 1.00 x 1000/?L  Absolute Immature Granulocyte Count 0.03 0.00 - 0.30 x 1000/?L  Absolute nRBC 0.00 0.00 - 1.00 x 1000/?L Diagnostics:1. Bilateral lower lobe pneumonitis is demonstrated. 2. There is evidence of underlying changes of bronchiectasis and scarring. 3. Cardiomegaly is demonstrated. Assessment Assessment: 87 yo male, DNR, from the [facility] (assisted living) w/ atrial fibrillation, hypertension, diabetes?not on medication, cavernous malformation, memory loss, hx of COVID, gait disorder, anxiety,  BPH, hypothyroidism admitted with productive cough of 2 weeks duration. Bibasilar infiltrates noted on CXR Plan Plan: Cough, abnormal CXR - as per aide and family cough, at times with sputum for 2 weeks but no fevers, dyspnea or other constitutional symptoms at any time; WBC and procal OK. No apparent issues with swallowing.  - ? At the back end of a vitral pneumonitis vs chronic  changes on his lungs - cw antibiotics for the time being ( azitrho, rocephin)  - will ask pulm to see the pt - PT, SLP eval - at baseline pt ambulates with walker and his aide for a short distance# Hx of HTNContinue PTA Metoprolol ?# Hx of Hypothyroidism - - Continue PTA Synthroid (175 mcg qDay)?# Hx of Mood Disorder - Olanzapine (2.5 mg qHS)?# Hx of BPH -- Continue PTA Finasteride (5 mg qDay) and Tamsulosin - straight cath for high residuals ( pt at baseline has residuals 400-500's as per family; renal function is stable with cre arund 1??DNRLikely dc home soon Electronically Signed:[id number] Blue Winther, MD Beeper 05/28/2021, 1:32 PM

## 2021-05-28 NOTE — Plan of Care
Patient seen bedside, supine in bed sleeping soundly,PPVT HHA in attendance reports patient only active after 8pm, usually sleeps al day. CM: Hazel aware patient not appropriate at this time. Recommend nsg staff see how pt is at night. Not able to assess at this time. RN: Roseanne Reno aware. Andrena Mews, MSPTPhysical therapistGreenwich [facility] Heart Beat# [phone].

## 2021-05-28 NOTE — Plan of Care
Plan of Care Overview/ Patient Status    2250 Received Pt from ED via stretcher. Accompanied by Transport & Caregiver. Pt is A&O x 1. Self. Reoriented. Pt ambulate from stretcher to bed with RW assist x 2. According to caregiver Pt is RW assist x 1 at baseline. Pt denies pain. Denies SOB. Blanchable redness to back possibly from laying in strecher for a long period of time. Skin intact other wise . Pt had Dark brown loose BM & voided. Care provided. Condom catheter in place. Sitter took all of Pt's belongings back to Pt facility. Bil SCDs. +PP, denies numbness & tingling. Bed alarm on. Call bell & essentials within reached. Safety maintained. Patient/Family acknowledge understanding of fall prevention education including to call nurse with assistance with ambulation.0020 Pt received Ceftriaxone & Azithromycin from ED at 20:21 both ordered for 0000. Hospitalist made aware & stated to adjust to 2000. Both adjusted.0210  Pt is resting in bed. No signs of distress noted.0315 Pt appears disoriented. Reoriented to place , time & situation0515 Med given. Pt resting comfortably0700 Pt is in stable condition. Bed alarm on. Call bell & essentials within reached safety maintained.

## 2021-05-28 NOTE — Utilization Review (ED)
UM Status: UR Reviewed-Continue Observation Services

## 2021-05-28 NOTE — ED Notes
10:22 PM SBAR HandoffSituation:	Admitting Diagnosis: The encounter diagnosis was Pneumonia of both lower lobes due to infectious organism.Background:  Chief Complaint Patient presents with ? Cough   Sent in from [facility] for cough x2 weeks with b/l infiltrates. Covid neg x2 home tests. Denies fever/cp/sob. No blood thinners.  Isolation status: Universal precautionsCOVID test from today is negativeAllergies:Allergies as of 05/27/2021 - Review Complete 05/27/2021 Allergen Reaction Noted ? Gluten protein Other (See Comments) 10/29/2013 ? Lactose Other (See Comments) 10/29/2013 ? Avelox [moxifloxacin]  02/16/2016 ? Ciprofloxacin  11/10/2018 ? Nuts [peanut]  02/16/2016 ? Penicillins  10/28/2013 ? Sulfa (sulfonamide antibiotics)  10/28/2013 Code status: FULL CODEAssessment:Vital signs:	Vitals:  05/27/21 1244 05/27/21 1454 05/27/21 1724 05/27/21 2015 BP: 112/75 114/69 (!) 156/83 109/67 Pulse: 64 64 88 79 Resp: 18 18 18 18  Temp: 97.8 ?F (36.6 ?C) 97.9 ?F (36.6 ?C)  98.1 ?F (36.7 ?C) TempSrc: Oral Oral   SpO2: 98% 98% 99% 96% Medications ordered and administered in the Emergency Department:	Medications azithromycin (ZITHROMAX) 500 mg in sodium chloride 0.9% 250 mL IVPB (vialmate) (0 mg Intravenous Stopped 05/27/21 2221) cefTRIAXone (ROCEPHIN) 1 g in sodium chloride 0.9 % 10 mL (100 mg/mL) (1 g Intravenous Given 05/27/21 2021) Labs ordered and resulted in the Emergency Department.	Results for orders placed or performed during the hospital encounter of 05/27/21 Blood culture  Specimen: Peripheral; Blood Result Value Ref Range  Blood Culture No Growth to Date  Blood culture  Specimen: Peripheral; Blood Result Value Ref Range  Blood Culture No Growth to Date  SARS CoV-2 (COVID-19) RNA-[facility] ([facility])  Specimen: Nasopharynx; Viral Result Value Ref Range  SARS-CoV-2 RNA (COVID-19)  Negative Negative Influenza A+B/RSV by RT-PCR ([facility])  Specimen: Nasopharynx; Viral Result Value Ref Range  Influenza A Negative Negative  Influenza B Negative Negative  Respiratory Syncytial Virus Negative Negative Respiratory virus PCR panel     ([facility])  Specimen: Nasopharynx; Viral Result Value Ref Range  Adenovirus Not Detected Not Detected  Human Metapneumovirus (HMPV) Not Detected Not Detected  Parainfluenza Virus 1 Not Detected Not Detected  Parainfluenza Virus 2 Not Detected Not Detected  Parainfluenza Virus 3 Not Detected Not Detected  Parainfluenza Virus 4 Not Detected Not Detected  Rhinovirus Not Detected Not Detected Procalcitonin     ([facility]) Result Value Ref Range  Procalcitonin <0.04 See Comment ng/mL Basic metabolic panel Result Value Ref Range  Sodium 136 136 - 145 mmol/L  Potassium 5.9 (H) 3.5 - 5.1 mmol/L  Chloride 106 95 - 115 mmol/L  CO2 26 21 - 32 mmol/L  Anion Gap 4 (L) 5 - 18  Glucose 115 (H) 70 - 100 mg/dL  BUN 36 (H) 8 - 25 mg/dL  Creatinine 1.61 0.96 - 1.30 mg/dL  Calcium 8.9 8.4 - 04.5 mg/dL  BUN/Creatinine Ratio 40.9 (H) 8.0 - 25.0  Osmolality Calculation 281 275 - 295 mOsm/kg  eGFR (Afr Amer) >60 >60 mL/min/1.81m2  eGFR (NON African-American) 58 >60 mL/min/1.40m2 CBC auto differential Result Value Ref Range  WBC 8.6 4.0 - 11.0 x1000/?L  RBC 4.99 4.00 - 6.00 M/?L  Hemoglobin 15.0 13.2 - 17.1 g/dL  Hematocrit 81.19 14.78 - 50.00 %  MCV 93.6 80.0 - 100.0 fL  MCH 30.1 27.0 - 33.0 pg  MCHC 32.1 31.0 - 36.0 g/dL  RDW-CV 29.5 62.1 - 30.8 %  Platelets 232 150 - 420 x1000/?L  MPV 9.7 8.0 - 12.0 fL  Neutrophils 59.9 39.0 - 72.0 %  Lymphocytes 30.0 17.0 -  50.0 %  Monocytes 8.8 4.0 - 12.0 %  Eosinophils 0.7 0.0 - 5.0 %  Basophil 0.3 0.0 - 1.4 %  Immature Granulocytes 0.3 0.0 - 1.0 %  nRBC 0.0 0.0 - 1.0 %  ANC(Abs Neutrophil Count) 5.15 2.00 - 7.60 x 1000/?L  Absolute Lymphocyte Count 2.59 0.60 - 3.70 x 1000/?L  Monocyte Absolute Count 0.76 0.00 - 1.00 x 1000/?L  Eosinophil Absolute Count 0.06 0.00 - 1.00 x 1000/?L  Basophil Absolute Count 0.03 0.00 - 1.00 x 1000/?L  Absolute Immature Granulocyte Count 0.03 0.00 - 0.30 x 1000/?L  Absolute nRBC 0.00 0.00 - 1.00 x 1000/?L NT-proBrain natriuretic peptide Result Value Ref Range  B-Type Natriuretic Peptide, Pro (proBNP) 2,719.0 (H) 0.0 - 1,800.0 pg/mL Radiology studies done in ER: 	CXR Final Result 1. Bilateral lower lobe pneumonitis is demonstrated. 2. There is evidence of underlying changes of bronchiectasis and scarring. 3. Cardiomegaly is demonstrated.  Reported and Signed by:  Cheri Guppy, MD   IV access:	 antecubital right, condition patent, no redness and DDI		 Mental status: Appropriate, Normal and CalmFunctional Status: Needs Assistance: needs assistanceCurrent pain level: 0Dysphagia screen performed: N/AIs patient a fall risk: YesIs patient on oxygen: NoSitter ordered/needed? NoRecommendations:		Outstanding tests: No	Consults: Yes see provider order	Pending labs/meds/blood products: No	Brief plan/summary:

## 2021-05-28 NOTE — Utilization Review (ED)
UM Status: UR Reviewed-Continue Observation Services Pneumonia, receiving IV ABT's, on RA, spoke with DR Mutic possible discharge tomorrow, to remain observation statusChristine Perlie Gold, RN BSNUtilization [phone]

## 2021-05-29 DIAGNOSIS — Z66 Do not resuscitate: Secondary | ICD-10-CM

## 2021-05-29 DIAGNOSIS — J471 Bronchiectasis with (acute) exacerbation: Secondary | ICD-10-CM

## 2021-05-29 DIAGNOSIS — Z20822 Contact with and (suspected) exposure to covid-19: Secondary | ICD-10-CM

## 2021-05-29 DIAGNOSIS — F0390 Unspecified dementia without behavioral disturbance: Secondary | ICD-10-CM

## 2021-05-29 DIAGNOSIS — R0602 Shortness of breath: Secondary | ICD-10-CM

## 2021-05-29 DIAGNOSIS — M1990 Unspecified osteoarthritis, unspecified site: Secondary | ICD-10-CM

## 2021-05-29 DIAGNOSIS — Z880 Allergy status to penicillin: Secondary | ICD-10-CM

## 2021-05-29 DIAGNOSIS — H409 Unspecified glaucoma: Secondary | ICD-10-CM

## 2021-05-29 DIAGNOSIS — J470 Bronchiectasis with acute lower respiratory infection: Secondary | ICD-10-CM

## 2021-05-29 DIAGNOSIS — I10 Essential (primary) hypertension: Secondary | ICD-10-CM

## 2021-05-29 DIAGNOSIS — E875 Hyperkalemia: Secondary | ICD-10-CM

## 2021-05-29 DIAGNOSIS — E039 Hypothyroidism, unspecified: Secondary | ICD-10-CM

## 2021-05-29 DIAGNOSIS — J189 Pneumonia, unspecified organism: Secondary | ICD-10-CM

## 2021-05-29 DIAGNOSIS — J219 Acute bronchiolitis, unspecified: Secondary | ICD-10-CM

## 2021-05-29 DIAGNOSIS — N400 Enlarged prostate without lower urinary tract symptoms: Secondary | ICD-10-CM

## 2021-05-29 DIAGNOSIS — Z91010 Allergy to peanuts: Secondary | ICD-10-CM

## 2021-05-29 DIAGNOSIS — N179 Acute kidney failure, unspecified: Secondary | ICD-10-CM

## 2021-05-29 DIAGNOSIS — Z882 Allergy status to sulfonamides status: Secondary | ICD-10-CM

## 2021-05-29 DIAGNOSIS — Z881 Allergy status to other antibiotic agents status: Secondary | ICD-10-CM

## 2021-05-29 DIAGNOSIS — Z91018 Allergy to other foods: Secondary | ICD-10-CM

## 2021-05-29 DIAGNOSIS — Z79899 Other long term (current) drug therapy: Secondary | ICD-10-CM

## 2021-05-29 DIAGNOSIS — I480 Paroxysmal atrial fibrillation: Secondary | ICD-10-CM

## 2021-05-29 DIAGNOSIS — K589 Irritable bowel syndrome without diarrhea: Secondary | ICD-10-CM

## 2021-05-29 DIAGNOSIS — E785 Hyperlipidemia, unspecified: Secondary | ICD-10-CM

## 2021-05-29 DIAGNOSIS — Z91011 Allergy to milk products: Secondary | ICD-10-CM

## 2021-05-29 DIAGNOSIS — Z79890 Hormone replacement therapy (postmenopausal): Secondary | ICD-10-CM

## 2021-05-29 DIAGNOSIS — F39 Unspecified mood [affective] disorder: Secondary | ICD-10-CM

## 2021-05-29 DIAGNOSIS — E119 Type 2 diabetes mellitus without complications: Secondary | ICD-10-CM

## 2021-05-29 DIAGNOSIS — K219 Gastro-esophageal reflux disease without esophagitis: Secondary | ICD-10-CM

## 2021-05-29 LAB — COMPREHENSIVE METABOLIC PANEL
BKR A/G RATIO: 0.8
BKR ALANINE AMINOTRANSFERASE (ALT): 20 U/L (ref 12–78)
BKR ALBUMIN: 2.6 g/dL — ABNORMAL LOW (ref 3.4–5.0)
BKR ALKALINE PHOSPHATASE: 62 U/L (ref 20–135)
BKR ANION GAP: 7 (ref 5–18)
BKR ASPARTATE AMINOTRANSFERASE (AST): 17 U/L (ref 5–37)
BKR AST/ALT RATIO: 0.9
BKR BILIRUBIN TOTAL: 0.3 mg/dL (ref 0.0–1.0)
BKR BLOOD UREA NITROGEN: 27 mg/dL — ABNORMAL HIGH (ref 8–25)
BKR BUN / CREAT RATIO: 27.3 — ABNORMAL HIGH (ref 8.0–25.0)
BKR CALCIUM: 8.1 mg/dL — ABNORMAL LOW (ref 8.4–10.3)
BKR CHLORIDE: 110 mmol/L (ref 95–115)
BKR CO2: 23 mmol/L (ref 21–32)
BKR CREATININE: 0.99 mg/dL (ref 0.50–1.30)
BKR EGFR (AFR AMER): >60 mL/min/{1.73_m2} (ref >60)
BKR EGFR (NON AFRICAN AMERICAN): >60 mL/min/{1.73_m2} (ref >60)
BKR GLOBULIN: 3.4 g/dL
BKR GLUCOSE: 91 mg/dL (ref 70–100)
BKR OSMOLALITY CALCULATION: 284 mosm/kg (ref 275–295)
BKR POTASSIUM: 4.4 mmol/L (ref 3.5–5.1)
BKR PROTEIN TOTAL: 6 g/dL — ABNORMAL LOW (ref 6.4–8.2)
BKR SODIUM: 140 mmol/L (ref 136–145)

## 2021-05-29 LAB — CBC WITH AUTO DIFFERENTIAL
BKR WAM ABSOLUTE IMMATURE GRANULOCYTES.: 0.05 x 1000/ÂµL (ref 0.00–0.30)
BKR WAM ABSOLUTE LYMPHOCYTE COUNT.: 2.45 x 1000/ÂµL (ref 0.60–3.70)
BKR WAM ABSOLUTE NRBC (2 DEC): 0 x 1000/ÂµL (ref 0.00–1.00)
BKR WAM ANALYZER ANC: 3.77 x 1000/ÂµL (ref 2.00–7.60)
BKR WAM BASOPHIL ABSOLUTE COUNT.: 0.05 x 1000/ÂµL (ref 0.00–1.00)
BKR WAM BASOPHILS: 0.7 % (ref 0.0–1.4)
BKR WAM EOSINOPHIL ABSOLUTE COUNT.: 0.08 x 1000/ÂµL (ref 0.00–1.00)
BKR WAM EOSINOPHILS: 1.1 % (ref 0.0–5.0)
BKR WAM HEMATOCRIT (2 DEC): 39.6 % (ref 38.50–50.00)
BKR WAM HEMOGLOBIN: 12.5 g/dL — ABNORMAL LOW (ref 13.2–17.1)
BKR WAM IMMATURE GRANULOCYTES: 0.7 % (ref 0.0–1.0)
BKR WAM LYMPHOCYTES: 34.5 % (ref 17.0–50.0)
BKR WAM MCH (PG): 30.1 pg (ref 27.0–33.0)
BKR WAM MCHC: 31.6 g/dL (ref 31.0–36.0)
BKR WAM MCV: 95.4 fL (ref 80.0–100.0)
BKR WAM MONOCYTE ABSOLUTE COUNT.: 0.71 x 1000/ÂµL (ref 0.00–1.00)
BKR WAM MONOCYTES: 10 % (ref 4.0–12.0)
BKR WAM MPV: 10.1 fL (ref 8.0–12.0)
BKR WAM NEUTROPHILS: 53 % (ref 39.0–72.0)
BKR WAM NUCLEATED RED BLOOD CELLS: 0 % (ref 0.0–1.0)
BKR WAM PLATELETS: 173 x1000/ÂµL (ref 150–420)
BKR WAM RDW-CV: 13 % (ref 11.0–15.0)
BKR WAM RED BLOOD CELL COUNT.: 4.15 M/ÂµL (ref 4.00–6.00)
BKR WAM WHITE BLOOD CELL COUNT: 7.1 x1000/ÂµL (ref 4.0–11.0)

## 2021-05-29 LAB — PROCALCITONIN     (BH GH LMW Q YH): BKR PROCALCITONIN: <0.04 ng/mL

## 2021-05-29 MED ORDER — DOXYCYCLINE HYCLATE 100 MG CAPSULE
100 mg | ORAL_CAPSULE | Freq: Two times a day (BID) | ORAL | 1 refills | Status: AC
Start: 2021-05-29 — End: 2021-12-23

## 2021-05-29 NOTE — Plan of Care
Plan of Care Overview/ Patient Status    Dual RN bedside handoff completed1900 -2000 Pt is A&O x 1. Self. RA. Lungs diminished. Very sleepy however, arousable to voice & gentle shaking. According to Pt caregiver that is his baseline. No resp. Distress. Denies SOB. Abd soft non tender. +BS x 4 Quadrants. Last BM 05/27/21. Tolerating Reg diet. Denies N/V. Condom catheter in place. OOB RW assist x 1.  +PP, denies Numbness & tingling. Bed alarm on. Call bell & essentials within reached. Safety maintained. Patient/Family acknowledge understanding of fall prevention education including to call nurse with assistance with ambulation.2110 Night meds given. 2300 -0130  Pt is resting in bed no signs of distress noted.0230 Foley emptied for 600cc of yellow color urine.0400 Pt is resting in bed. No signs of distress noted. 0522 Tylenol given for headache.Schedule Azytromycin & Rocephin were given. However, dose that was reschedule as per Hospitalist, was not given as it were same as dose schedule. 0630 Pt is in stable condition. Bed alarm on. Call bell & essentials within reached. Safety maintained.

## 2021-05-29 NOTE — Plan of Care
Adult Speech and Language PathologySwallow Evaluation7/28/2022Patient Name:  Keith MargolinMR#:  [id number] Date of Birth:  April 26, 1920Therapist:  Lendell Caprice, SLP SLP IP Adult General Information - 05/29/21 1021    General Information  Pertinent History of Current Problem This is a 87 y/o with a h/o chronic cough that has worsened over the past few weeks.  Yesterday it got worse and he went to urgent care.  A CXR showed bibasilar infiltrates and he was transferred to the hospital.  He had a [employer] scan that showed bilateral lower lobe bronchiectasis with associated bronchiolitis. His oxygenation was normal on room air.  His procal was normal and he was afebrile with a normal WBC.  He has known GERD and his esophageal is very large on the [place] scan.  Currently on regular solids with thin liquids, orders placed for CSE.      SLP IP Adult Clinical/Bedside Swallow Evaluation - 05/29/21 1021    Clinical / Bedside Swallow Evaluation  Type of Study Initial   Reason for Study concern for aspiration risk   Patients Current Diet Regular consistency diet;Thin liquids   Patient Position Bed;Upright   Patient Respiratory Status during evaluation - O2 Delivery Room Air        Breathing Pattern Nasal        Baseline Coughing Absent        Baseline Throat Clearing Absent        Baseline Breath/Vocal Quality Normal   Saliva Swallows WFL   Pre-Oral Skills / Observations Adequate   Thin Liquid yes        Delivery Cup;Straw        Volume straw sips, single cup sips        Oral Phase Adequate;Successful cup drinking;Successful straw drinking        Pharyngeal Phase Overt signs/symptoms of aspiration/difficulty        Overt Signs / Symptoms of Aspiration / Difficulty Cough        Other Comments / Observations Immediate cough noted with straw sips, resolved with small single cup sips.   Soft Solid yes        Delivery Spoon;Fed by examiner        Volume bites scrambled eggs        Oral Phase Increased oral transit time;Prolonged mastication        Pharyngeal Phase Adequate        Overt Signs / Symptoms of Aspiration / Difficulty None   Regular Solid yes        Delivery Self-fed;Fed by examiner        Volume bites toast        Oral Phase Prolonged mastication;Decreased mastication / bolus formation        Pharyngeal Phase Adequate        Overt Signs / Symptoms of Aspiration / Difficulty None        Other Comments / Observations Patient with initial difficulty biting a piece of toast off, stated, It's too hard.  Soft middle piece provided - prolonged/effortful mastication.   Clinical Impression Suspected oral phase dysphagia;Suspected pharyngeal dysphagia;Mild   Clinical Impression Based On: Decreased masticatory ability;Cough   Predictors of Aspiration Pneumonia Dependence for oral care/feeding;Decayed teeth   Patient should be NPO No   Diet Recommendations Dental soft;Thin liquids   Medication Administration whole;with puree;with liquid   Recommendations Eat slowly;Sit upright with eating and drinking;Small sips / bites;Liquid from cup only   Patients feeding ability Patient needs to be fed   Aspiration  Precautions yes        Precautions include: Head of bed 90 degrees/Chair;Small bolus sizes;Slow feeding;Remain upright;Oral care 3-4 times/daily with a toothbrush   Oral Care Recommendations 3x/day;Mechanical brushing with toothbrush;Patient requires total assist for oral care   Discontinue PO if patient experiences: Increased temperature;Increased coughing;Increased fatigue   Other (Comments) Referral received, chart reviewed, clinical swallow evaluation completed (see above).  Patient received at bedside asleep, alerted to tactile stimuli and name call.  Reluctant to open eyes but conversant with clinician, eventually opened eyes.  RN at bedside for medication administration, noted with immediate cough with thin liquid via straw sip.  Therapeutic PO trials administered: soft solids, regular solids, thin liquids (straw and cup).  No further overt s/s aspiration with thin liquids via cup sips.  With solids, demonstrated prolonged/effortful mastication.  In summary,  oral stage dysphagia and clinical signs of aspiration in patient of advanced age.  Diet modification is indicated  at this time.  Recommend PO diet dental soft solids with thin liquids VIA SINGLE CUP SIPS ONLY, NO STRAWS.  Patient must be fully awake/alert for PO intake, hold tray if lethargic.  Silent aspiration cannot be ruled out at the bedside, if clinical concern for silent aspiration may consider instrumental swallow study (MBS) which can be done as an outpatient.  D/w RN and message sent to Dr. Romona Curls via smartweb.   Follow Up Will follow-up with patient, if clinical concern for  silent aspiration consider outpatient MBS.      SLP IP Adult Inpatient Recommendations - 05/29/21 1125    SLP Recommendations for Inpatient Admission  Swallow Recommendations 1.) PO diet dental soft solids with thin liquids VIA SINGLE CUP SIPS ONLY, NO STRAWS.  2.) Meds whole with liquid or in puree as tolerated.  3.) Total assist with feeding. 4.) Maintain aspiration precautions - PO only when awake/alert, upright position, slow rate, small bites/sips, no straw.  5.) Will continue to monitor patient, consider outpatient MBS if clinical concern for silent aspiration.     Plan of Care Overview/ Patient Status

## 2021-05-29 NOTE — Plan of Care
Plan of Care Overview/ Patient Status    0700: Dual RN bedside handoff completed. PT was hard to arouse A/Ox1.0900: Medication given to pt she was A/Ox1pt is total care out of bed 1 assist with walker, Skin dry and intact. Pt respiration diminished  bilaterally, heart sounds WDL, + p.p bilaterally, +BS in all 4 quadrants, pts last BM 7/26, voiding in external catheter. Hep lock in right arm CDI, pt was evaluated for swallowing and tolerated pills and food well water and juice is preferred to be given without straw.PT was Call bell in reach with bedside table.10:30: pt would be discharge to [facility] in the afternoon.1045: Daughter [name] called for updated on pt, she was informed that pt will be discharged to [facility].1251: Pt ready for dc. Will go over instructions with aid that is at bedside.1410: pt IV was removed dc paper were reviewed with aid.pt will be escorted by wheelchair with aid to front lobby.1430: pt was escorted to lobby via wheelchair and nurse.

## 2021-05-29 NOTE — Discharge Summary
[facility] Dis[facility] SummaryPatient Data:  Patient Name: Keith Strickland Admit date: 05/27/2021 Age: 87 y.o. Discharge date: 05/29/21 DOB: [birth year]	 Discharge Attending Physician: Marshell Levan, MD  MRN: [id number]	 Discharged Condition: stable PCP: Cherrie Distance, MD Disposition: Home with Homecare Services Principal Diagnosis: bronchiectasis exacerabtionSecondary Diagnoses:  dementia, diet controlled DM, HTNIssues to be Addressed Post Discharge: Pending Labs and Tests: Pending Lab Results   Order Current Status  Blood culture Preliminary result  Blood culture Preliminary result  Follow-up Information:Kubersky, Viviann Spare, MD No future appointments.Hospital Course: Hospital Course: 87 yo male, DNR, from the [facility] (assisted living) w/ atrial fibrillation, hypertension, diabetes?not on medication, cavernous malformation, memory loss, hx of COVID, gait disorder, anxiety, ?BPH, hypothyroidism admitted with productive cough of 2 weeks duration. Bibasilar infiltrates noted on CXRCough, abnormal CXR - start red on azithro and rocephin in ED - as per aide and family cough, at times with sputum for 2 weeks but no fevers, dyspnea or other constitutional symptoms at any time; WBC and procal OK. No apparent issues with swallowing.  - [place] [facility] with bronchiectasis - was seen by pulm, Dr Tito Dine Bronchiectasis: he has lower lower bronchiectasis.  This is due to prior lung infections.  He may have bronchiolitis now from a viral infection.  He has not discrete infiltrates.  However, it is reasonable to treat with antibtiocs for the worsening cough and sputum  I agree with the current antibiotics .  He can be switched to doxcycline to complete a course of abx for 7 days.    - cw antibiotics for the time being ( azitrho, rocephin)  - PT, SLP eval - home with PT; dental soft diet with thins, use cup and no straw - at baseline pt ambulates with walker and his aide for a short distance?# Hx of HTNContinue PTA Metoprolol ?# Hx of Hypothyroidism - - Continue PTA Synthroid (175 mcg qDay)?# Hx of Mood Disorder -?Olanzapine (2.5 mg qHS)??# Hx of BPH -- Continue PTA Finasteride (5 mg qDay) and Tamsulosin??DNRBack to [place] with his aideVNS for home PTPertinent lab findings and test results: Objective: Recent Labs Lab 07/26/221605 07/27/220543 07/28/220553 WBC 8.6 8.6 7.1 HGB 15.0 12.6* 12.5* HCT 46.70 39.90 39.60 PLT 232 190 173  Recent Labs Lab 07/26/221605 07/27/220543 07/28/220553 NEUTROPHILS 59.9 53.2 53.0  Recent Labs Lab 07/26/221605 07/27/220543 07/28/220553 NA 136 139 140 K 5.9* 5.3* 4.4 CL 106 108 110 CO2 26 28 23  BUN 36* 29* 27* CREATININE 1.16 1.05 0.99 GLU 115* 90 91 ANIONGAP 4* 3* 7  Recent Labs Lab 07/26/221605 07/27/220543 07/28/220553 CALCIUM 8.9 8.5 8.1*  Recent Labs Lab 07/28/220553 ALT 20 AST 17 ALKPHOS 62 BILITOT 0.3  No results for input(s): PTT, LABPROT, INR in the last 168 hours. Culture Information:Recent Labs Lab 07/26/221605 LABBLOO Abnormal Stain* - No Growth to Date Imaging: Imaging results last 1 week:  CXRResult Date: 7/26/20221. Bilateral lower lobe pneumonitis is demonstrated. 2. There is evidence of underlying changes of bronchiectasis and scarring. 3. Cardiomegaly is demonstrated. Reported and Signed by:  Cheri Guppy, MD [place] Chest wo IV ContrastResult Date: 05/28/2021 Likely progression of fibrotic changes at the lung bases with superimposed bronchiolitis. The presence of aspiration is not excluded. Reported and Signed by:  Zollie Pee, MD Diet:  Dental soft with thin liquids; use cup and do not use straw; assist with feedingMobility: Highest Level of mobility - ACTUAL: Mobility Level 6, Walk 10+ steps,AM PAC 18-21Physical Therapy Disposition Recommendation: HomeAdditional Physical Therapy Disposition Recommendations: Home with 24 hour assistancePhysical Exam Discharge vitals: Temp:  [  97.4 ?F (36.3 ?C)-97.7 ?F (36.5 ?C)] 97.4 ?F (36.3 ?C)Pulse:  [62-76] 62Resp:  [17-21] 17BP: (109-155)/(66-85) 155/70SpO2:  [95 %-98 %] 98 %Device (Oxygen Therapy): room air Discharge Physical Exam:Physical Exam GEN; eldelry male; not in distress; sleepy this am but arousableNEURO; awake and semi alertHEENT: dry  Mucous membranesCHEST;  clear to auscultation- no wheezing or rhonchiHEART; regular rate and rhythmABD; soft, NTNDExt;  edemaAllergies Allergies Allergen Reactions ? Gluten Protein Other (See Comments)   intolerance ? Lactose Other (See Comments)   intolerance ? Avelox [Moxifloxacin]  ? Ciprofloxacin  ? Nuts [Peanut]  ? Penicillins  ? Sulfa (Sulfonamide Antibiotics)   PMH PSH Past Medical History: Diagnosis Date ? Anxiety  ? Closed displaced fracture of left femoral neck (HC Code) (HC CODE) (HC Code)  ? GERD (gastroesophageal reflux disease)  ? Glaucoma  ? Hyperlipidemia  ? Hypertension  ? Hypothyroidism  ? IBS (irritable bowel syndrome)  ? Kyphosis  ? Lumbosacral radiculopathy  ? Major depression  ? Osteoarthritis  ? Paroxysmal atrial fibrillation (HC Code) (HC CODE) (HC Code)  ? Pneumonia  ? Vitamin deficiency   Past Surgical History: Procedure Laterality Date ? FRACTURE SURGERY    Social History Family History Social History Tobacco Use ? Smoking status: Never Smoker ? Smokeless tobacco: Never Used Substance Use Topics ? Alcohol use: No  Family History Family history unknown: Yes  Discharge Medications: Discharge: Current Discharge Medication List  START taking these medications  Details doxycycline hyclate (VIBRAMYCIN) 100 mg capsule Take 1 capsule (100 mg total) by mouth 2 (two) times daily.Qty: 5 capsule, Refills: 0Start date: 05/29/2021   CONTINUE these medications which have NOT CHANGED  Details acetaminophen (TYLENOL) 325 mg tablet Take 2 tablets (650 mg total) by mouth every 6 (six) hours as needed (Pain/Fever).Qty: 100 tablet, Refills: 5  azelastine (OPTIVAR) 0.05 % ophthalmic solution Place 1 drop into both eyes daily.Qty: 6 mL, Refills: 5  finasteride (PROSCAR) 5 mg tablet Take 1 tablet (5 mg total) by mouth daily.Qty: 30 tablet, Refills: 5  levothyroxine (SYNTHROID, LEVOTHROID) 175 mcg tablet Take 1 tablet (175 mcg total) by mouth every morning.Qty: 30 tablet, Refills: 5  metoprolol succinate XL (TOPROL-XL) 25 mg 24 hr tablet Take 0.5 tablets (12.5 mg total) by mouth daily. Take with or immediately following a meal.Qty: 15 tablet, Refills: 5  OLANZapine (ZYPREXA) 2.5 mg tablet Take 1 tablet (2.5 mg total) by mouth nightly.Qty: 30 tablet, Refills: 5  omeprazole (PRILOSEC) 20 mg capsule Take 1 capsule (20 mg total) by mouth 2 times daily (0900, 1700).Qty: 30 capsule, Refills: 5  polyethylene glycol (MIRALAX) 17 gram packet Take 1 packet (17 g total) by mouth daily. Mix in 8 ounces of fluidQty: 30 each, Refills: 5  pravastatin (PRAVACHOL) 20 mg tablet Take 1 tablet (20 mg total) by mouth daily with dinner.Qty: 30 tablet, Refills: 5  tamsulosin (FLOMAX) 0.4 mg 24 hr capsule Take 2 capsules (0.8 mg total) by mouth nightly. Take 2 capsules (0.8mg ) by mouth at bedtime.Qty: 60 capsule, Refills: 5  timolol (TIMOPTIC) 0.5 % ophthalmic solution Place 1 drop into both eyes daily.Qty: 10 mL, Refills: 5  travoprost (TRAVATAN Z) 0.004 % ophthalmic solution Place 1 drop into both eyes daily.Qty: 5 mL, Refills: 5   STOP taking these medications   aluminum-magnesium hydroxide-simethicone (MAALOX) 200-200-20 mg/5 mL suspension    bismuth subsalicylate (PEPTO BISMOL,KAOPECTATE) 262 mg/15 mL suspension    cholecalciferol, vitamin D3, 125 mcg (5,000 unit) tablet    guaiFENesin (GERI-TUSSIN) 100 mg/5 mL Liqd  magnesium hydroxide (MILK OF MAGNESIA) 400 mg/5 mL suspension    melatonin 1 mg tablet    traMADoL (ULTRAM) 50 mg tablet    There was at total of approximately 35 minutes spent on the discharge of this patientElectronically Signed:[id number] Genise Strack, MD 05/29/2021 10:15 AMBest Contact Information:

## 2021-05-29 NOTE — Plan of Care
Plan of Care Overview/ Patient Status    Patient is a resident at [facility] with 24/7 care.Left message for Daine Floras  [phone] with patient daughter Linton Rump [phone].IMM discussed with with pt daughter _; with no intent to appeal discharge. IMM given to secretary to scan in patient's chart.

## 2021-05-29 NOTE — Progress Notes
PROGRESS NOTEPatient: Keith Strickland: 7/28/2022History limited by: the condition of the patientSUBJECTIVE:This is a 89 YOM with a h/o chronic cough that has worsened over the past few weeks.  Yesterday it got worse and he went to urgent care.  A CXR showed bibasilar infiltrates and he was transferred to the hospital.  He had a [place] scan that showed bilateral lower lobe bronchiectasis with assoicated bronchiolitis. His oxygenation was normal on room air.  His procal was normal and he was afebrile with a normal WBC.  He has known GERD and his esophageal is very large on the [place] scanVITALS:BP: (109-155)/(66-85) 155/70 (07/28 0908)Temp:  [97.4 ?F (36.3 ?C)-97.7 ?F (36.5 ?C)] 97.4 ?F (36.3 ?C) (07/27 1042-07/28 0908)Pulse:  [62-76] 62 (07/28 0908)SpO2:  [95 %-98 %] 98 % (07/28 0908)Resp:  [17-21] 17 (07/28 0908)I/O: Gross Totals (Last 24 hours) at 05/29/2021 1042Last data filed at 05/29/2021 0511Intake -- Output 800 ml Net -800 ml  FAO:ZHYQM: only c/o pain in his penisHEENT: no other complaintsCHest: says he is not coughing muchCor:no chest painABD: no complaintsEXT: weakSkin: no itchingPHYSICAL EXAM:GEN; poorNEURO; awake and semi alertHEENT: dry  Mucous membranesCHEST;  clear to auscultation- no wheezing or rhonchiHEART; regular rate and rhythmABD; soft, NTNDExt;  edemaSKIN: some excoriationsASSESSMENT/PLAN:This is a 87 y.o. male patient who  has a past medical history of Anxiety, Closed displaced fracture of left femoral neck (HC Code) (HC CODE) (HC Code), GERD (gastroesophageal reflux disease), Glaucoma, Hyperlipidemia, Hypertension, Hypothyroidism, IBS (irritable bowel syndrome), Kyphosis, Lumbosacral radiculopathy, Major depression, Osteoarthritis, Paroxysmal atrial fibrillation (HC Code) (HC CODE) (HC Code), Pneumonia, and Vitamin deficiency..  The hospitalization has been notable for possible pneumonia with cough.Problem #1: Bronchiectasis: he has lower lobe bronchiectasis. This is probably the etiology for this cough.   The bornchiectasis is due to prior lung infections.  He may have bronchiolitis now from a viral infection.  He has no discrete infiltrates.  However, it is reasonable to treat with antibtiocs for the worsening cough and sputum  I agree with the current antibiotics .  He can be switched to doxcycline to complete a course of abx for 7 days.  PRobelm #2: Cough: this is due to the bronchiectasis.  He can take any OTC cough medicinePRobelm #3: R/O aspiration: his esophagus appears large, I would be concerned for possible aspiration.  I would recommend a swallowing study prior to dishcarge.Attending: Marshell Levan, MDDr. Gilda Crease, MD M.D.Date: 7/28/2022Time: 6:46 PMBeeper: #[id number] - DATA - DIAGNOSTICS: LABORATORIES:I have reviewed the patient's labs within the last 24 hrs.DIAGNOSTICS:No CXR today CURRENT MEDICATIONS: Current Facility-Administered Medications Medication Dose Route Frequency Provider Last Rate Last Admin ? azithromycin (ZITHROMAX) tablet 250 mg  250 mg Oral Q24H Shepherd-Hall, Janiline En, PA   250 mg at 05/28/21 2106 ? cefTRIAXone (ROCEPHIN) 1 g in sodium chloride 0.9 % 10 mL (100 mg/mL)  1 g IV Push Q24H Shepherd-Hall, Janiline En, PA   1 g at 05/28/21 2107 ? enoxaparin (LOVENOX) syringe 40 mg  40 mg Subcutaneous Daily Janina Mayo, MD   40 mg at 05/29/21 0912 ? finasteride (PROSCAR) tablet 5 mg  5 mg Oral Daily Janina Mayo, MD   5 mg at 05/29/21 5784 ? ketotifen (ZADITOR) 0.025 % (0.035 %) ophthalmic solution 1 drop  1 drop Both Eyes BID Janina Mayo, MD   1 drop at 05/29/21 0951 ? latanoprost (XALATAN) 0.005 % ophthalmic solution 1 drop  1 drop Both Eyes Nightly Janina Mayo, MD   1 drop at 05/28/21 2207 ? levothyroxine (SYNTHROID, LEVOTHROID) tablet 175  mcg  175 mcg Oral Delfina Redwood, MD   175 mcg at 05/29/21 0502 ? metoprolol succinate (TOPROL-XL) 24 hr tablet 12.5 mg  12.5 mg Oral Daily Janina Mayo, MD   12.5 mg at 05/29/21 9562 ? OLANZapine (ZyPREXA) tablet 2.5 mg  2.5 mg Oral Nightly Habib, Taimur, MD   2.5 mg at 05/28/21 2107 ? sodium chloride 0.9 % flush 3 mL  3 mL IV Push Q8H Diwan, Adnan, MD   3 mL at 05/28/21 2120 ? tamsulosin (FLOMAX) 24 hr capsule 0.8 mg  0.8 mg Oral Nightly Habib, Taimur, MD   0.8 mg at 05/28/21 2106 ? timolol (TIMOPTIC) 0.5 % ophthalmic solution 1 drop  1 drop Both Eyes Daily Janina Mayo, MD   1 drop at 05/29/21 0951  ? sodium chloride 75 mL/hr (05/28/21 1312) acetaminophen, bisacodyL, ondansetron **OR** ondansetron (ZOFRAN) IV Push, sodium chloride

## 2021-05-29 NOTE — Other
CONSULT	NOTEPatient: Keith Strickland: 7/27/2022History limited by: the condition of the patientSUBJECTIVE:This is a 87 YOM with a h/o chronic cough that has worsened over the past few weeks.  Yesterday it got worse and he went to urgent care.  A CXR showed bibasilar infiltrates and he was transferred to the hospital.  He had a [place] scan that showed bilateral lower lobe bronchiectasis with assoicated bronchiolitis. His oxygenation was normal on room air.  His procal was normal and he was afebrile with a normal WBC.  He has known GERD and his esophageal is very large on the [place] scanPMHX: anxiety, GERD, HTN, HLD, glaucoma, hypothyroidism, IBS, kyphosis, LS radiculopathyDepression, OA, PAF and pneumoinaPSHX: ORIF left femoral ncekcALL: gluten, lactose, moxifloxacin, cipro, PCN, sulfaMEds: see reconVITALS:BP: (109-144)/(59-92) 139/75 (07/27 1705)Temp:  [96.7 ?F (35.9 ?C)-98.1 ?F (36.7 ?C)] 97.6 ?F (36.4 ?C) (07/26 1846-07/27 1705)Pulse:  [62-83] 69 (07/27 1705)SpO2:  [96 %-98 %] 98 % (07/27 1705)Resp:  [16-21] 21 (07/27 1705)I/O: Gross Totals (Last 24 hours) at 05/28/2021 1846Last data filed at 05/27/2021 2221Intake 250 ml Output -- Net 250 ml  ZOX:WRUEA: very tired but able to answer questionsHEENT: no complaintsCHest: says he is not coughing muchCor:no chest painABD: no complaintsEXT: weakSkin: no itchingPHYSICAL EXAM:GEN; poorNEURO; awake and semi alertHEENT: dry  Mucous membranesCHEST;  clear to auscultation- no wheezing or rhonchiHEART; regular rate and rhythmABD; soft, NTNDExt;  edemaSKIN: some excoriationsASSESSMENT/PLAN:This is a 87 y.o. male patient who  has a past medical history of Anxiety, Closed displaced fracture of left femoral neck (HC Code) (HC CODE) (HC Code), GERD (gastroesophageal reflux disease), Glaucoma, Hyperlipidemia, Hypertension, Hypothyroidism, IBS (irritable bowel syndrome), Kyphosis, Lumbosacral radiculopathy, Major depression, Osteoarthritis, Paroxysmal atrial fibrillation (HC Code) (HC CODE) (HC Code), Pneumonia, and Vitamin deficiency..  The hospitalization has been notable for possible pneumonia with cough.Problem #1: Bronchiectasis: he has lower lower bronchiectasis.  This is due to prior lung infections.  He may have bronchiolitis now from a viral infection.  He has not discrete infiltrates.  However, it is reasonable to treat with antibtiocs for the worsening cough and sputum  I agree with the current antibiotics .  He can be switched to doxcycline to complete a course of abx for 7 days.  PRobelm #2: Cough: this is due to the bronchiectasis.  He can take any OTC cough medicinePRobelm #3: R/O aspiration: his esophagus appears large, I would be concerned for possible aspiration.  I would recommend a swallowing study prior to dishcarge.Thank you for this consult.Attending: Marshell Levan, MDDr. Gilda Crease, MD M.D.Date: 7/27/2022Time: 6:46 PMBeeper: #[id number] - DATA - DIAGNOSTICS: LABORATORIES:I have reviewed the patient's labs within the last 24 hrs.DIAGNOSTICS:[place] scan: Lung: Reticular opacities at the lung bases with questionable mild traction bronchiectasis. There is superimposed reticular opacification in the dependent lower lobes as well as the dependent portions of the right lower lobe with bronchial wall thickening and bronchiolitis. CURRENT MEDICATIONS: Current Facility-Administered Medications Medication Dose Route Frequency Provider Last Rate Last Admin ? azithromycin (ZITHROMAX) tablet 250 mg  250 mg Oral Q24H Shepherd-Hall, Janiline En, PA     ? cefTRIAXone (ROCEPHIN) 1 g in sodium chloride 0.9 % 10 mL (100 mg/mL)  1 g IV Push Q24H Shepherd-Hall, Janiline En, PA     ? enoxaparin (LOVENOX) syringe 40 mg  40 mg Subcutaneous Daily Janina Mayo, MD   40 mg at 05/28/21 1050 ? finasteride (PROSCAR) tablet 5 mg  5 mg Oral Daily Janina Mayo, MD   5 mg at 05/28/21 5409 ? ketotifen (ZADITOR) 0.025 % (0.035 %)  ophthalmic solution 1 drop  1 drop Both Eyes BID Janina Mayo, MD   1 drop at 05/28/21 0941 ? latanoprost (XALATAN) 0.005 % ophthalmic solution 1 drop  1 drop Both Eyes Nightly Habib, Taimur, MD     ? levothyroxine (SYNTHROID, LEVOTHROID) tablet 175 mcg  175 mcg Oral QAM Janina Mayo, MD   175 mcg at 05/28/21 0515 ? metoprolol succinate (TOPROL-XL) 24 hr tablet 12.5 mg  12.5 mg Oral Daily Janina Mayo, MD   12.5 mg at 05/28/21 9629 ? OLANZapine (ZyPREXA) tablet 2.5 mg  2.5 mg Oral Nightly Habib, Taimur, MD     ? sodium chloride 0.9 % flush 3 mL  3 mL IV Push Q8H Diwan, Adnan, MD     ? tamsulosin (FLOMAX) 24 hr capsule 0.8 mg  0.8 mg Oral Nightly Habib, Taimur, MD     ? timolol (TIMOPTIC) 0.5 % ophthalmic solution 1 drop  1 drop Both Eyes Daily Janina Mayo, MD   1 drop at 05/28/21 0940  ? sodium chloride 75 mL/hr (05/28/21 1312) acetaminophen, bisacodyL, ondansetron **OR** ondansetron (ZOFRAN) IV Push, sodium chloride

## 2021-05-29 NOTE — Utilization Review (ED)
UM Status: UR Reviewed-Continue Observation Services Obs hrs 40 for pneumonia. WBC 7.1, 98% RA, Pulmonary consult complete. Speech evaluation pending. Gavin Faivre, RNUtilization Review SpecialistMHB text [email or web address]- Discharge order writtenElectronically Signed by Berline Lopes, RN, May 29, 2021 10:25 AM

## 2021-05-29 NOTE — Discharge Instructions
-   PT/OT as tolerated - resume all of his usual home medications - pt has bronchiectasis and chronic changes on [place] chest; so probably bronchiectasis exacerbation; treat with doxycycline 100 mg bid for 5 more days as per pulm - had speech eval: soft dental diet with thin liquids; use cup; do not use straw; assist with feeds

## 2021-05-30 LAB — BLOOD CULTURE   (BH GH L LMW YH): BKR BLOOD CULTURE: ABNORMAL — CR

## 2021-06-01 LAB — BLOOD CULTURE   (BH GH L LMW YH): BKR BLOOD CULTURE: NO GROWTH

## 2021-07-29 ENCOUNTER — Emergency Department: Admit: 2021-07-29 | Payer: MEDICARE | Primary: Internal Medicine

## 2021-07-29 ENCOUNTER — Inpatient Hospital Stay: Admit: 2021-07-29 | Discharge: 2021-07-31 | Payer: MEDICARE | Attending: Internal Medicine | Primary: Internal Medicine

## 2021-07-29 DIAGNOSIS — R0681 Apnea, not elsewhere classified: Secondary | ICD-10-CM

## 2021-07-29 DIAGNOSIS — R5383 Other fatigue: Secondary | ICD-10-CM

## 2021-07-29 DIAGNOSIS — R4182 Altered mental status, unspecified: Secondary | ICD-10-CM

## 2021-07-29 LAB — INFLUENZA A+B/RSV BY RT-PCR
BKR INFLUENZA A: NEGATIVE
BKR INFLUENZA B: NEGATIVE
BKR RESPIRATORY SYNCYTIAL VIRUS: NEGATIVE

## 2021-07-29 LAB — CBC WITH AUTO DIFFERENTIAL
BKR WAM ABSOLUTE IMMATURE GRANULOCYTES.: 0.03 x 1000/ÂµL (ref 0.00–0.30)
BKR WAM ABSOLUTE LYMPHOCYTE COUNT.: 1.81 x 1000/ÂµL (ref 0.60–3.70)
BKR WAM ABSOLUTE NRBC (2 DEC): 0 x 1000/ÂµL (ref 0.00–1.00)
BKR WAM ANALYZER ANC: 5.83 x 1000/ÂµL (ref 2.00–7.60)
BKR WAM BASOPHIL ABSOLUTE COUNT.: 0.02 x 1000/ÂµL (ref 0.00–1.00)
BKR WAM BASOPHILS: 0.2 % (ref 0.0–1.4)
BKR WAM EOSINOPHIL ABSOLUTE COUNT.: 0.09 x 1000/ÂµL (ref 0.00–1.00)
BKR WAM EOSINOPHILS: 1.1 % (ref 0.0–5.0)
BKR WAM HEMATOCRIT (2 DEC): 41.5 % (ref 38.50–50.00)
BKR WAM HEMOGLOBIN: 13.6 g/dL (ref 13.2–17.1)
BKR WAM IMMATURE GRANULOCYTES: 0.4 % (ref 0.0–1.0)
BKR WAM LYMPHOCYTES: 21.4 % (ref 17.0–50.0)
BKR WAM MCH (PG): 30.2 pg (ref 27.0–33.0)
BKR WAM MCHC: 32.8 g/dL (ref 31.0–36.0)
BKR WAM MCV: 92.2 fL (ref 80.0–100.0)
BKR WAM MONOCYTE ABSOLUTE COUNT.: 0.68 x 1000/ÂµL (ref 0.00–1.00)
BKR WAM MONOCYTES: 8 % (ref 4.0–12.0)
BKR WAM MPV: 9.9 fL (ref 8.0–12.0)
BKR WAM NEUTROPHILS: 68.9 % (ref 39.0–72.0)
BKR WAM NUCLEATED RED BLOOD CELLS: 0 % (ref 0.0–1.0)
BKR WAM PLATELETS: 174 x1000/ÂµL (ref 150–420)
BKR WAM RDW-CV: 12.8 % (ref 11.0–15.0)
BKR WAM RED BLOOD CELL COUNT.: 4.5 M/ÂµL (ref 4.00–6.00)
BKR WAM WHITE BLOOD CELL COUNT: 8.5 x1000/ÂµL (ref 4.0–11.0)

## 2021-07-29 LAB — COMPREHENSIVE METABOLIC PANEL
BKR A/G RATIO: 1
BKR ALANINE AMINOTRANSFERASE (ALT): 17 U/L (ref 12–78)
BKR ALBUMIN: 3.2 g/dL — ABNORMAL LOW (ref 3.4–5.0)
BKR ALKALINE PHOSPHATASE: 66 U/L (ref 20–135)
BKR ANION GAP: 6 (ref 5–18)
BKR ASPARTATE AMINOTRANSFERASE (AST): 12 U/L (ref 5–37)
BKR AST/ALT RATIO: 0.7
BKR BILIRUBIN TOTAL: 0.3 mg/dL (ref 0.0–1.0)
BKR BLOOD UREA NITROGEN: 34 mg/dL — ABNORMAL HIGH (ref 8–25)
BKR BUN / CREAT RATIO: 29.3 — ABNORMAL HIGH (ref 8.0–25.0)
BKR CALCIUM: 8.9 mg/dL (ref 8.4–10.3)
BKR CHLORIDE: 106 mmol/L (ref 95–115)
BKR CO2: 27 mmol/L (ref 21–32)
BKR CREATININE: 1.16 mg/dL (ref 0.50–1.30)
BKR EGFR, CREATININE (CKD-EPI 2021): 56 mL/min/{1.73_m2} — ABNORMAL LOW (ref >=60)
BKR GLOBULIN: 3.1 g/dL
BKR GLUCOSE: 162 mg/dL — ABNORMAL HIGH (ref 70–100)
BKR OSMOLALITY CALCULATION: 289 mosm/kg (ref 275–295)
BKR POTASSIUM: 4.3 mmol/L (ref 3.5–5.1)
BKR PROTEIN TOTAL: 6.3 g/dL — ABNORMAL LOW (ref 6.4–8.2)
BKR SODIUM: 139 mmol/L (ref 136–145)

## 2021-07-29 LAB — LIPID PANEL
BKR CHOLESTEROL/HDL RATIO: 2.7 (ref 1.0–3.9)
BKR CHOLESTEROL: 139 mg/dL (ref 50–200)
BKR HDL CHOLESTEROL: 51 mg/dL (ref 40–90)
BKR LDL CHOLESTEROL SAMPSON CALCULATED: 68 mg/dL (ref 20–130)
BKR TRIGLYCERIDES: 113 mg/dL (ref 30–200)

## 2021-07-29 LAB — SARS COV-2 (COVID-19) RNA: BKR SARS-COV-2 RNA (COVID-19) (YH): NEGATIVE

## 2021-07-29 LAB — URINE MICROSCOPIC     (BH GH LMW YH)
BKR RBC/HPF INSTRUMENT: 15 /HPF — ABNORMAL HIGH (ref 0–2)
BKR WBC/HPF INSTRUMENT: 252 /HPF — ABNORMAL HIGH (ref 0–5)

## 2021-07-29 LAB — ZZZURINALYSIS WITH CULTURE REFLEX     (L Q)
BKR BILIRUBIN, UA: NEGATIVE
BKR GLUCOSE, UA: NEGATIVE
BKR KETONES, UA: NEGATIVE
BKR NITRITE, UA: NEGATIVE
BKR PH, UA: 6 (ref 5.5–7.5)
BKR SPECIFIC GRAVITY, UA: 1.015 (ref 1.005–1.030)
BKR UROBILINOGEN, UA (MG/DL): <2 mg/dL (ref <=2.0)

## 2021-07-29 LAB — TROPONIN T HIGH SENSITIVITY, 1 HOUR WITH REFLEX (BH GH LMW YH)
BKR TROPONIN T HS 1 HOUR DELTA FROM 0 HOUR: 1 ng/L
BKR TROPONIN T HS 1 HOUR: 45 ng/L — ABNORMAL HIGH

## 2021-07-29 LAB — TROPONIN T HIGH SENSITIVITY, 3 HOUR (BH GH LMW YH)
BKR TROPONIN T HS 1 HOUR DELTA FROM 0 HOUR ON 3HR: 1 ng/L
BKR TROPONIN T HS 3 HOUR DELTA FROM 0 HOUR: -4 ng/L
BKR TROPONIN T HS 3 HOUR: 40 ng/L — ABNORMAL HIGH

## 2021-07-29 LAB — C-REACTIVE PROTEIN     (CRP): BKR C-REACTIVE PROTEIN: 0.4 mg/dL (ref 0.0–1.0)

## 2021-07-29 LAB — TROPONIN T HIGH SENSITIVITY, 0 HOUR BASELINE WITH REFLEX (BH GH LMW YH): BKR TROPONIN T HS 0 HOUR BASELINE: 44 ng/L — ABNORMAL HIGH

## 2021-07-29 LAB — LACTIC ACID, PLASMA (REFLEX 2H REPEAT): BKR LACTATE: 1.6 mmol/L (ref 0.4–2.0)

## 2021-07-29 LAB — TSH W/REFLEX TO FT4     (BH GH LMW Q YH): BKR THYROID STIMULATING HORMONE: 0.676 u[IU]/mL (ref 0.358–3.740)

## 2021-07-29 MED ORDER — LATANOPROST 0.005 % EYE DROPS
0.005 % | Freq: Every evening | OPHTHALMIC | Status: SS
Start: 2021-07-29 — End: 2022-01-08

## 2021-07-29 MED ORDER — ESCITALOPRAM 10 MG TABLET
10 mg | Freq: Every evening | ORAL | Status: DC
Start: 2021-07-29 — End: 2021-07-31
  Administered 2021-07-30 – 2021-07-31 (×2): 10 mg via ORAL

## 2021-07-29 MED ORDER — SODIUM CHLORIDE 0.9 % (FLUSH) INJECTION SYRINGE
0.9 % | Freq: Three times a day (TID) | INTRAVENOUS | Status: DC
Start: 2021-07-29 — End: 2021-07-31
  Administered 2021-07-30 – 2021-07-31 (×2): 0.9 mL via INTRAVENOUS

## 2021-07-29 MED ORDER — POLYETHYLENE GLYCOL 3350 17 GRAM ORAL POWDER PACKET
17 gram | Freq: Every day | ORAL | Status: DC
Start: 2021-07-29 — End: 2021-07-31
  Administered 2021-07-30 – 2021-07-31 (×2): 17 gram via ORAL

## 2021-07-29 MED ORDER — TIMOLOL MALEATE 0.5 % EYE DROPS
0.5 % | Freq: Every day | OPHTHALMIC | Status: DC
Start: 2021-07-29 — End: 2021-07-31
  Administered 2021-07-30 – 2021-07-31 (×2): 0.5 mL via OPHTHALMIC

## 2021-07-29 MED ORDER — SYSTANE BALANCE 0.6 % EYE DROPS
0.6 % | Freq: Three times a day (TID) | OPHTHALMIC | Status: SS
Start: 2021-07-29 — End: 2022-01-08

## 2021-07-29 MED ORDER — ZZ IMS TEMPLATE
Freq: Every morning | ORAL | Status: DC
Start: 2021-07-29 — End: 2021-07-31
  Administered 2021-07-30 – 2021-07-31 (×2): 175 mcg via ORAL

## 2021-07-29 MED ORDER — CHOLECALCIFEROL (VITAMIN D3) 125 MCG (5,000 UNIT) CAPSULE
125 mcg (5,000 unit) | ORAL | Status: SS
Start: 2021-07-29 — End: 2022-01-08

## 2021-07-29 MED ORDER — ESCITALOPRAM 10 MG TABLET
10 mg | Freq: Every evening | ORAL | Status: SS
Start: 2021-07-29 — End: 2022-01-08

## 2021-07-29 MED ORDER — METOPROLOL SUCCINATE ER (TOPROL) 12.5 MG HALFTAB
12.5 mg | Freq: Every day | ORAL | Status: DC
Start: 2021-07-29 — End: 2021-07-31
  Administered 2021-07-30: 13:00:00 12.5 mg via ORAL

## 2021-07-29 MED ORDER — MELATONIN 1 MG TABLET
1 mg | Freq: Every evening | ORAL | Status: SS
Start: 2021-07-29 — End: 2022-01-08

## 2021-07-29 MED ORDER — MAGNESIUM HYDROXIDE 400 MG/5 ML ORAL SUSPENSION
400 mg/5 mL | Freq: Every day | ORAL | Status: SS | PRN
Start: 2021-07-29 — End: 2022-01-08

## 2021-07-29 MED ORDER — SODIUM CHLORIDE 0.9 % (FLUSH) INJECTION SYRINGE
0.9 % | INTRAVENOUS | Status: DC | PRN
Start: 2021-07-29 — End: 2021-07-31
  Administered 2021-07-29: 18:00:00 0.9 mL via INTRAVENOUS

## 2021-07-29 MED ORDER — TAMSULOSIN 0.4 MG CAPSULE
0.4 mg | Freq: Two times a day (BID) | ORAL | Status: SS
Start: 2021-07-29 — End: 2022-01-08

## 2021-07-29 MED ORDER — OLANZAPINE 2.5 MG TABLET
2.5 mg | Freq: Every evening | ORAL | Status: DC
Start: 2021-07-29 — End: 2021-07-31
  Administered 2021-07-30 – 2021-07-31 (×2): 2.5 mg via ORAL

## 2021-07-29 MED ORDER — TAMSULOSIN 0.4 MG CAPSULE
0.4 mg | Freq: Every evening | ORAL | Status: DC
Start: 2021-07-29 — End: 2021-07-31
  Administered 2021-07-30 – 2021-07-31 (×2): 0.4 mg via ORAL

## 2021-07-29 MED ORDER — CEFTRIAXONE IV PUSH 1000 MG VIAL & NS (ADULTS)
INTRAVENOUS | Status: DC
Start: 2021-07-29 — End: 2021-07-31
  Administered 2021-07-30 – 2021-07-31 (×2): 10.000 mL via INTRAVENOUS

## 2021-07-29 MED ORDER — PANTOPRAZOLE 40 MG TABLET,DELAYED RELEASE
40 mg | Freq: Every day | ORAL | Status: DC
Start: 2021-07-29 — End: 2021-07-31
  Administered 2021-07-30 – 2021-07-31 (×2): 40 mg via ORAL

## 2021-07-29 MED ORDER — LATANOPROST 0.005 % EYE DROPS
0.005 % | Freq: Every evening | OPHTHALMIC | 6 refills | Status: DC
Start: 2021-07-29 — End: 2021-07-31
  Administered 2021-07-30 – 2021-07-31 (×2): 0.005 mL via OPHTHALMIC

## 2021-07-29 MED ORDER — SODIUM CHLORIDE 0.9 % INTRAVENOUS SOLUTION
INTRAVENOUS | Status: DC
Start: 2021-07-29 — End: 2021-07-30
  Administered 2021-07-29 – 2021-07-30 (×2): via INTRAVENOUS

## 2021-07-29 MED ORDER — FINASTERIDE 5 MG TABLET
5 mg | Freq: Every day | ORAL | Status: DC
Start: 2021-07-29 — End: 2021-07-31
  Administered 2021-07-30 – 2021-07-31 (×2): 5 mg via ORAL

## 2021-07-29 MED ORDER — ACETAMINOPHEN 325 MG TABLET
325 mg | Freq: Four times a day (QID) | ORAL | Status: DC | PRN
Start: 2021-07-29 — End: 2021-07-31
  Administered 2021-07-30 – 2021-07-31 (×2): 325 mg via ORAL

## 2021-07-29 MED ORDER — ENOXAPARIN 40 MG/0.4 ML SUBCUTANEOUS SYRINGE
400.4 mg/0.4 mL | SUBCUTANEOUS | Status: DC
Start: 2021-07-29 — End: 2021-07-31
  Administered 2021-07-30 – 2021-07-31 (×2): 40 mg/0.4 mL via SUBCUTANEOUS

## 2021-07-29 MED ORDER — KETOTIFEN 0.025 % (0.035 %) EYE DROPS
0.0250.035 % (0.035 %) | Freq: Two times a day (BID) | OPHTHALMIC | 6 refills | Status: DC
Start: 2021-07-29 — End: 2021-07-31
  Administered 2021-07-30 – 2021-07-31 (×4): 0.025 mL via OPHTHALMIC

## 2021-07-29 MED ORDER — CEFTRIAXONE IV PUSH 1000 MG VIAL & NS (ADULTS)
Freq: Once | INTRAVENOUS | Status: CP
Start: 2021-07-29 — End: ?
  Administered 2021-07-29: 18:00:00 10.000 mL via INTRAVENOUS

## 2021-07-29 NOTE — Utilization Review (ED)
UM Status: UR/MD agree on OBS status Dr. Norville Haggard. Pt with AMS and generalized fatigue. Aflutter on EKG per provider note. Trop 44. HR 60-70s. CXR without evidence of active pleural disease.

## 2021-07-29 NOTE — ED Notes
11:36 AM Pt BIBA with C/O increased AMS with apnea per pts aid on [facility]. On arrival pt is alert and confused. Blood work send to lab. Pt placed on cardiac monitor. Call bell within reach. Safety maintained.

## 2021-07-29 NOTE — ED Notes
SBAR HandoffSituation:	Admitting Diagnosis: The encounter diagnosis was Atrial flutter, unspecified type (HC Code) (HC CODE) (HC Code).Background:  Chief Complaint Patient presents with ? Altered Mental Status   BIBA from [facility] with C/O AMS with apnea per pts aid. Recently admitted with PNE. Blood glucose of 130 per EMS. On arrival pt is alert and confused. No C/O of pain or discomfort. VSS. Isolation status: Not applicableCOVID test from today is pendignAllergies:Allergies as of 07/29/2021 - Review Complete 07/29/2021 Allergen Reaction Noted ? Gluten protein Other (See Comments) 10/29/2013 ? Lactose Other (See Comments) 10/29/2013 ? Avelox [moxifloxacin]  02/16/2016 ? Ciprofloxacin  11/10/2018 ? Nuts [peanut]  02/16/2016 ? Penicillins  10/28/2013 ? Sulfa (sulfonamide antibiotics)  10/28/2013 Code status: DNRAssessment:Vital signs:	Vitals:  07/29/21 1129 07/29/21 1204 BP: (!) 110/58 129/82 Pulse: 66 78 Resp: 16 18 Temp: 97.8 ?F (36.6 ?C) 97.7 ?F (36.5 ?C) TempSrc: Oral Temporal SpO2: 97% 98% Weight:  99.4 kg Medications ordered and administered in the Emergency Department:	Medications - No data to displayLabs ordered and resulted in the Emergency Department.	Results for orders placed or performed during the hospital encounter of 07/29/21 Troponin T High Sensitivity, Emergency; 0 hour baseline AND 1 hour with reflex (3 hour) Result Value Ref Range  High Sensitivity Troponin T 44 (H) See Comment ng/L CBC auto differential Result Value Ref Range  WBC 8.5 4.0 - 11.0 x1000/?L  RBC 4.50 4.00 - 6.00 M/?L  Hemoglobin 13.6 13.2 - 17.1 g/dL  Hematocrit 16.10 96.04 - 50.00 %  MCV 92.2 80.0 - 100.0 fL  MCH 30.2 27.0 - 33.0 pg  MCHC 32.8 31.0 - 36.0 g/dL  RDW-CV 54.0 98.1 - 19.1 %  Platelets 174 150 - 420 x1000/?L  MPV 9.9 8.0 - 12.0 fL  Neutrophils 68.9 39.0 - 72.0 %  Lymphocytes 21.4 17.0 - 50.0 %  Monocytes 8.0 4.0 - 12.0 %  Eosinophils 1.1 0.0 - 5.0 %  Basophil 0.2 0.0 - 1.4 %  Immature Granulocytes 0.4 0.0 - 1.0 %  nRBC 0.0 0.0 - 1.0 %  ANC(Abs Neutrophil Count) 5.83 2.00 - 7.60 x 1000/?L  Absolute Lymphocyte Count 1.81 0.60 - 3.70 x 1000/?L  Monocyte Absolute Count 0.68 0.00 - 1.00 x 1000/?L  Eosinophil Absolute Count 0.09 0.00 - 1.00 x 1000/?L  Basophil Absolute Count 0.02 0.00 - 1.00 x 1000/?L  Absolute Immature Granulocyte Count 0.03 0.00 - 0.30 x 1000/?L  Absolute nRBC 0.00 0.00 - 1.00 x 1000/?L Comprehensive metabolic panel Result Value Ref Range  Sodium 139 136 - 145 mmol/L  Potassium 4.3 3.5 - 5.1 mmol/L  Chloride 106 95 - 115 mmol/L  CO2 27 21 - 32 mmol/L  Anion Gap 6 5 - 18  Glucose 162 (H) 70 - 100 mg/dL  BUN 34 (H) 8 - 25 mg/dL  Creatinine 4.78 2.95 - 1.30 mg/dL  Calcium 8.9 8.4 - 62.1 mg/dL  BUN/Creatinine Ratio 30.8 (H) 8.0 - 25.0  Total Protein 6.3 (L) 6.4 - 8.2 g/dL  Albumin 3.2 (L) 3.4 - 5.0 g/dL  Total Bilirubin 0.3 0.0 - 1.0 mg/dL  Alkaline Phosphatase 66 20 - 135 U/L  Alanine Aminotransferase (ALT) 17 12 - 78 U/L  Aspartate Aminotransferase (AST) 12 5 - 37 U/L  Globulin 3.1 g/dL  A/G Ratio 1.0   AST/ALT Ratio 0.7 See Comment  Osmolality Calculation 289 275 - 295 mOsm/kg  eGFR (Creatinine) 56 (L) >=60 mL/min/1.44m2 Radiology studies done in ER: 	CXR (portable) Final Result   Limited study without evidence of active pleural or parenchymal disease. Stable bibasilar fibrosis.  Reported and Signed by:  Geroge Baseman, MD   [place] wo IV Contrast    (Results Pending) Hand 3V Right    (Results Pending) Wrist 3V Right    (Results Pending) Wrist 3V Left    (Results Pending) Hand 3V Left    (Results Pending) IV access:	 hand left, condition patent, no redness and DDI	Mental status: Normal  At baselineFunctional Status: Needs Assistance: two personCurrent pain level: 3Dysphagia screen performed: NoIs patient a fall risk: YesIs patient on oxygen: NoSitter ordered/needed? NoRecommendations:		Outstanding tests: Yes	Consults: No	Pending labs/meds/blood products: Yes	Brief plan/summary: The encounter diagnosis was Atrial flutter, unspecified type (HC Code) (HC CODE) (HC Code).\

## 2021-07-29 NOTE — Other
PHARMACY-ASSISTED MEDICATION REPORTPharmacist review of the best possible medication history obtained by the pharmacy medication history technician has been performed.  I have updated the home medication list and identified the following information that may be relevant to this admission. Recommendations:  N/A Notes:Metoprolol Succinate ER 25mg : Nursing staff at facility confirmed patient taking 0.5 tablet (12.5mg ) daily.Vitamin D3 5000 Units: Nursing staff at facility reports patient taking DAILY, not twice weekly.Home medication list updated and reviewed. Medication history completed through review of patient's W10 provided by [facility] in [place], [place], dated July 29, 2021, review of pharmacy records, and phone call to nursing staff at facilityCurrent Medications Medication Sig Note  acetaminophen (TYLENOL) 325 mg tablet Take 2 tablets (650 mg total) by mouth every 6 (six) hours as needed (Pain/Fever).   azelastine (OPTIVAR) 0.05 % ophthalmic solution Place 1 drop into both eyes daily.   cholecalciferol, vitamin D3, (VITAMIN D3) 125 mcg (5,000 unit) tablet Take 5,000 Units by mouth daily. 07/29/2021: Pharm Tech (LS): Nursing staff at facility reports patient takes DAILY.  escitalopram oxalate (LEXAPRO) 10 mg tablet Take 10 mg by mouth nightly. 07/29/2021: Pharm Tech (LS): Per Z61, patient takes nightly, however, nursing staff at facility reports patient administered therapy this morning, 07/29/21.  finasteride (PROSCAR) 5 mg tablet Take 1 tablet (5 mg total) by mouth daily.   latanoprost (XALATAN) 0.005 % ophthalmic solution Place 1 drop into both eyes nightly.   levothyroxine (SYNTHROID, LEVOTHROID) 175 mcg tablet Take 1 tablet (175 mcg total) by mouth every morning.   magnesium hydroxide (MILK OF MAGNESIA) 400 mg/5 mL suspension Take 30 mLs by mouth daily as needed for constipation.   melatonin 1 mg tablet Take 1 mg by mouth nightly.   metoprolol succinate XL (TOPROL-XL) 25 mg 24 hr tablet Take 0.5 tablets (12.5 mg total) by mouth daily. Take with or immediately following a meal.   OLANZapine (ZYPREXA) 2.5 mg tablet Take 1 tablet (2.5 mg total) by mouth nightly. 07/29/2021: Pharm Tech (LS): Per W96, patient takes nightly, however, nursing staff at facility reports patient administered therapy this morning, 07/29/21.  omeprazole (PRILOSEC) 20 mg capsule Take 1 capsule (20 mg total) by mouth 2 times daily (0900, 1700).   polyethylene glycol (MIRALAX) 17 gram packet Take 1 packet (17 g total) by mouth daily. Mix in 8 ounces of fluid   pravastatin (PRAVACHOL) 20 mg tablet Take 1 tablet (20 mg total) by mouth daily with dinner. 07/29/2021: Pharm Tech (LS): Nursing staff at facility reports patient takes every morning.  propylene glycoL (SYSTANE BALANCE) 0.6 % ophthalmic solution Place 1 drop into both eyes 3 (three) times daily.   tamsulosin (FLOMAX) 0.4 mg 24 hr capsule Take 0.4 mg by mouth 2 (two) times daily. 07/29/2021: Pharm Tech (LS): Updated sig from 2 capsules QD to 1 capsule BID, per W10.  timolol (TIMOPTIC) 0.5 % ophthalmic solution Place 1 drop into both eyes daily.  Britt Bottom, PharmD4:38 PM9/27/2022Phone: Available via [employer] (MHB)

## 2021-07-29 NOTE — H&P
Internal Medicine [facility] History & PhysicalAttending Provider: Jene Every, MDHistory provided by: EMR reviewHistory limited by: no limitationsHistory of Present Illness:  CC: AMSHPI:  87 y.o. male (resident of [place]) with a PMH of HTN, afib, DM2, memory loss, glaucoma, macular degeneration, and BPH who presents to the hospital with symptoms of AMS, apnea, and fatigue. Patient's daughter reports that patient had bilateral wrist pain and R thumb pain. He has also had decreased PO intake. Has not had any F/C/N/V, chest pain, shortness of breath, or abdominal pain.While in the ED, vitals were Temp 97.12F, BP 110/58, HR 66, RR 16, SpO2 97%. Initial labs included BUN 34, glucose 162, TProt 6.3, Alb 3.2, HST 44 > 45, UA RBC 15, WBC 252, cloudy, prot 1+, blood 2+, leuk 4+. EKG revealed aflutter. Initial imaging included CXR w/stable bibasilar fibrosis, XR R hand + wrist remarkable for diffuse osseous demineralization and arthritic changes, and XR L hand + wrist pending. CTH showing cerebral and cerebellar atrophy, patchy white matter lucencies are most likely related to chronic small vessel ischemic changes + small focus of encephalomalacia in R occipital lobe likely reflecting small old infarct. Treatment in the ED included ceftriaxone 1g IVx1.Upon evaluation by Clinical research associate, patient denies pain. Given AMS, patient does not answer all questions appropriately.Patient is admitted to the hospital for further treatment and workup.Review of Systems: See HPIMedical History: Past Medical History: Diagnosis Date ? Anxiety  ? Closed displaced fracture of left femoral neck (HC Code) (HC CODE) (HC Code)  ? GERD (gastroesophageal reflux disease)  ? Glaucoma  ? Hyperlipidemia  ? Hypertension  ? Hypothyroidism  ? IBS (irritable bowel syndrome)  ? Kyphosis  ? Lumbosacral radiculopathy  ? Major depression  ? Osteoarthritis  ? Paroxysmal atrial fibrillation (HC Code) (HC CODE) (HC Code)  ? Pneumonia  ? Vitamin deficiency  No past medical history pertinent negatives. Family History Family history unknown: Yes  Past Surgical History: Procedure Laterality Date ? FRACTURE SURGERY   No past surgical history pertinent negatives on file. Social History Socioeconomic History ? Marital status: Widowed   Spouse name: Not on file ? Number of children: Not on file ? Years of education: Not on file ? Highest education level: Not on file Occupational History ? Not on file Tobacco Use ? Smoking status: Never Smoker ? Smokeless tobacco: Never Used Substance and Sexual Activity ? Alcohol use: No ? Drug use: No ? Sexual activity: Not on file Other Topics Concern ? Not on file Social History Narrative ? Not on file Social Determinants of Health Financial Resource Strain: Not on file Food Insecurity: Not on file Transportation Needs: Not on file Physical Activity: Not on file Stress: Not on file Social Connections: Not on file Intimate Partner Violence: Not on file Housing Stability: Not on file  Home Medications: (Not in a hospital admission)Allergies as of 07/29/2021 - Review Complete 07/29/2021 Allergen Reaction Noted ? Gluten protein Other (See Comments) 10/29/2013 ? Lactose Other (See Comments) 10/29/2013 ? Avelox [moxifloxacin]  02/16/2016 ? Ciprofloxacin  11/10/2018 ? Nuts [peanut]  02/16/2016 ? Penicillins  10/28/2013 ? Sulfa (sulfonamide antibiotics)  10/28/2013 Objective: VITALS:Temp:  [97.7 ?F (36.5 ?C)-97.8 ?F (36.6 ?C)] 97.7 ?F (36.5 ?C)Pulse:  [56-78] 56Resp:  [16-18] 18BP: (110-132)/(58-82) 132/61SpO2:  [96 %-98 %] 96 % Weight: 99.4 kg PHYSICAL EXAM:General: NADHEENT: NC/AT. EOMI, no scleral icterus or conjunctival injection. Dry MM.Heart: RRR. Normal S1 and S2. No murmurs, rubs or gallops appreciated. Lungs: No increased work of breathing. Lungs clear at anterolateral fields. No wheezes, rales,  or rhonchi appreciated. Abdomen: Soft, NT/ND, Normoactive bowel sounds x4. MSK: Moves all 4 ext spontaneouslyExtremities: WWP. Pulses: 2+ radial and 2+ DP pulsesSkin: Warm and dryNeurologic: Awake, alert, and oriented to self. Does not respond to questions appropriately. No focal neuro deficits appreciated.Psychiatric: Normal mood and affect Labs: Recent Labs Lab 09/27/221139 WBC 8.5 HGB 13.6 HCT 41.50 PLT 174  Recent Labs Lab 09/27/221139 NEUTROPHILS 68.9  Recent Labs Lab 09/27/221139 NA 139 K 4.3 CL 106 CO2 27 BUN 34* CREATININE 1.16 GLU 162* ANIONGAP 6  Recent Labs Lab 09/27/221139 CALCIUM 8.9  Recent Labs Lab 09/27/221139 ALT 17 AST 12 ALKPHOS 66 BILITOT 0.3 PROT 6.3* ALBUMIN 3.2*  No results for input(s): PTT, LABPROT, INR in the last 168 hours. DIAGNOSTICS/RADIOLOGY:CXR (portable)Result Date: 07/29/2021  Limited study without evidence of active pleural or parenchymal disease. Stable bibasilar fibrosis. Reported and Signed by:  Geroge Baseman, MD Wrist 3V RightResult Date: 9/27/20221. Diffuse osseous demineralization. 2. No radiographic evidence of acute osseous injury of the right hand or right wrist. 3. Arthritic changes may entirely be related to osteoarthrosis; however, the presence of chondrocalcinosis raises the possibility of superimposed CPPD arthritis. Clinical correlation recommended. Reported and Signed by:  Silvio Pate, MD Hand 3V RightResult Date: 9/27/20221. Diffuse osseous demineralization. 2. No radiographic evidence of acute osseous injury of the right hand or right wrist. 3. Arthritic changes may entirely be related to osteoarthrosis; however, the presence of chondrocalcinosis raises the possibility of superimposed CPPD arthritis. Clinical correlation recommended. Reported and Signed by:  Silvio Pate, MD [place] wo IV ContrastResult Date: 9/27/20221. Cerebral and cerebellar atrophy. 2. Patchy white matter lucencies are most likely related to chronic small vessel ischemic changes. Small focus of encephalomalacia in the right occipital lobe most likely reflects a small old infarct. 3. No definite  evidence of acute infarct; however, if there is strong clinical suspicion, further evaluation with MRI could be considered as it is more sensitive in the detection of early infarction. Reported and Signed by:  Silvio Pate, MD MICROBIOLOGY:Urine CxIn process -----------------------------------------------------------------------------------------------Blood Cx:In processAssessment & Plan: 87 y.o. male with PMH of HTN, a fib, DM2, hypothyroidism, mood disorder, memory loss, glaucoma, macular degeneration, and BPH who presents to the hospital with symptoms of AMS, apnea, and fatigue. Patient found to have +UA- will be admitted for further workup and management of UTI.Attending: Jene Every, MD#UTI Admission UA cloudy, 1+ prot, 2+ blood, 4+leuk- Ceftriaxone 1g IV q24h- F/u UCx. Adjust abx pending cx results.#HTN - PTA metoprolol succinate 12.5mg  PO QD#Hypothyroidism - PTA levothyroxine PO QD#BPH - PTA finasteride 5mg  PO QD- PTA tamsulosin 0.4mg  PO qhs (2 capsules for a total of 0.8mg )#Glaucoma - PTA timolol 0.5% 1 drop OU QD- PTA travoprost 0.004% 1 drop OU QD- not on formulary	- Substitute w/latanoprost 0.005% 1 drop OU qhs#Mood Disorder - PTA olanzapine 2.5mg  PO qhs#Misc- SLP eval for dysphagiaDIET: Diet NPO Effective NowFLUIDS: NS @ 75cc/hrELECTROLYTES: replete PRNPROPHYLAXIS: LVNX, SCDs   ACCESS: PIVDISPOSITION: Medical Floor w/RTM CODE STATUS: No ACLS/BLSCase to be discussed with attending physician. A/P subject to change by attending's addendum. Primary Emergency Contact: Sommer,[name], Home Phone: [phone]: PCP: Mariel Sleet Care Provider was notified of this admission. NoFamily was notified of this admission. YesPlan discussed with patient and/or family. YesSigned:Ernestina Joe Arlester Marker, MD, PGY-3[employer]: [phone]: 21829/27/2022 2:46 PM

## 2021-07-29 NOTE — Plan of Care
Admission Note Nursing Keith Strickland is a 87 y.o. male admitted with a chief complaint of AMS. Patient arrived from [place]. Pt is A&Ox1, room air with no c/o sob or any other acute distress. Total Care . Dysphagia diet. NS IVF at 75 mL/hr. Safety precautions maintained and frequent rounding. Patient is  Orientation: disoriented to;place;time;situation Vitals:  07/29/21 1424 07/29/21 1500 07/29/21 1624 07/29/21 1749 BP: 132/61  125/87 (!) 147/78 Pulse: (!) 56  60 68 Resp: 18 18 17 20  Temp:    98 ?F (36.7 ?C) TempSrc:    Oral SpO2: 96%  99% 99% Weight:     Oxygen therapy Oxygen TherapySpO2: 99 %I have reviewed the patient's current medication orders.     Current Facility-Administered Medications Medication Dose Route Frequency Provider Last Rate Last Admin  [START ON 07/30/2021] cefTRIAXone (ROCEPHIN) 1 g in sodium chloride 0.9% PF 10 mL (100 mg/mL)  1 g IV Push Q24H Otto Herb, MD      Melene Muller ON 07/30/2021] enoxaparin (LOVENOX) syringe 40 mg  40 mg Subcutaneous Daily Otto Herb, MD      escitalopram oxalate (LEXAPRO) tablet 10 mg  10 mg Oral Nightly Otto Herb, MD      Melene Muller ON 07/30/2021] finasteride (PROSCAR) tablet 5 mg  5 mg Oral Daily Otto Herb, MD      ketotifen (ZADITOR) 0.025 % (0.035 %) ophthalmic solution 1 drop  1 drop Both Eyes BID Otto Herb, MD      latanoprost (XALATAN) 0.005 % ophthalmic solution 1 drop  1 drop Both Eyes Nightly Otto Herb, MD      Melene Muller ON 07/30/2021] levothyroxine (SYNTHROID, LEVOTHROID) tablet 175 mcg  175 mcg Oral Isaac Bliss, MD      Melene Muller ON 07/30/2021] metoprolol succinate (TOPROL-XL) 24 hr tablet 12.5 mg  12.5 mg Oral Daily Otto Herb, MD      OLANZapine (ZyPREXA) tablet 2.5 mg  2.5 mg Oral Nightly Otto Herb, MD      Melene Muller ON 07/30/2021] pantoprazole (PROTONIX) EC tablet 40 mg  40 mg Oral Daily Otto Herb, MD      Melene Muller ON 07/30/2021] polyethylene glycol (MIRALAX) packet 17 g  17 g Oral Daily Otto Herb, MD      sodium chloride 0.9 % flush 3 mL  3 mL IV Push Q8H Otto Herb, MD      tamsulosin (FLOMAX) 24 hr capsule 0.8 mg  0.8 mg Oral Nightly Otto Herb, MD      Melene Muller ON 07/30/2021] timolol (TIMOPTIC) 0.5 % ophthalmic solution 1 drop  1 drop Both Eyes Daily Otto Herb, MD       sodium chloride 75 mL/hr (07/29/21 1442) acetaminophen, sodium chlorideCurrent Facility-Administered Medications Medication Dose Route Frequency Last Rate  acetaminophen  650 mg Oral Q6H PRN    [START ON 07/30/2021] cefTRIAXone  1 g IV Push Q24H    [START ON 07/30/2021]  enoxaparin prophylaxis dosing  40 mg Subcutaneous Daily    escitalopram oxalate  10 mg Oral Nightly    [START ON 07/30/2021] finasteride  5 mg Oral Daily    ketotifen  1 drop Both Eyes BID    latanoprost  1 drop Both Eyes Nightly    [START ON 07/30/2021] levothyroxine  175 mcg Oral QAM    [START ON 07/30/2021] metoprolol succinate XL  12.5 mg Oral Daily    OLANZapine  2.5 mg Oral Nightly    [START ON 07/30/2021] pantoprazole  40 mg Oral Daily    [START ON 07/30/2021] polyethylene glycol  17 g Oral Daily    sodium chloride  3 mL IV Push Q8H    sodium chloride  3 mL IV Push PRN for Line Care    sodium chloride  75 mL/hr Intravenous Continuous 75 mL/hr (07/29/21 1442)  tamsulosin  0.8 mg Oral Nightly    [START ON 07/30/2021] timolol  1 drop Both Eyes Daily   .No valuables with patient at this time Belongings charted in last 7 days: Valuable(s) : None (07/29/2021  6:00 PM) Comments:See flowsheets, patient education and plan of care for additional information. Plan of Care Overview/ Patient Status

## 2021-07-29 NOTE — ED Provider Notes
HistoryChief Complaint Patient presents with ? Altered Mental Status   BIBA from [facility] with C/O AMS with apnea per pts aid. Recently admitted with PNE. Blood glucose of 130 per EMS. On arrival pt is alert and confused. No C/O of pain or discomfort. VSS.  87 yo male, DNR, from the [facility], w/ history of atrial fibrillation, hypertension, diabetes not on medication, memory loss, BPH and macular degeneration. Pt comes in with his daughter with complaints of generalized fatigue, bilateral wrist and right thumb pain. Pt denies known trauma. However, per daughter, pt has had decreased PO intake for the past few days, daughter notes he has ambulatory with his walker at baseline. Pt also complains of penile pain. Denies fevers, chills, SOB, abdominal pain, nausea, vomiting or any other acute complaints. Hx limited due to the pt's altered mental status.  patient has history of frequent UTIs in the past.Past Medical History:No date: AnxietyNo date: Closed displaced fracture of left femoral neck (HC Code) (HC CODE) (HC Code)No date: GERD (gastroesophageal reflux disease)No date: GlaucomaNo date: HyperlipidemiaNo date: HypertensionNo date: HypothyroidismNo date: IBS (irritable bowel syndrome)No date: KyphosisNo date: Lumbosacral radiculopathyNo date: Major depressionNo date: OsteoarthritisNo date: Paroxysmal atrial fibrillation (HC Code) (HC CODE) (HC Code)No date: PneumoniaNo date: Vitamin deficiencyPast Surgical History:No date: FRACTURE SURGERYAllergies: -- Gluten Protein -- Other (See Comments)  --  intolerance -- Lactose -- Other (See Comments)  --  intolerance -- Avelox (Moxifloxacin)  -- Ciprofloxacin  -- Nuts (Peanut)  -- Penicillins  -- Sulfa (Sulfonamide Antibiotics) The history is provided by the patient and a relative. The history is limited by the condition of the patient. FatigueSeverity:  ModerateOnset quality: GradualProgression:  WorseningChronicity:  NewAssociated symptoms: arthralgias and dysuria  Associated symptoms: no chest pain and no diarrhea   Past Medical History: Diagnosis Date ? Anxiety  ? Closed displaced fracture of left femoral neck (HC Code) (HC CODE) (HC Code)  ? GERD (gastroesophageal reflux disease)  ? Glaucoma  ? Hyperlipidemia  ? Hypertension  ? Hypothyroidism  ? IBS (irritable bowel syndrome)  ? Kyphosis  ? Lumbosacral radiculopathy  ? Major depression  ? Osteoarthritis  ? Paroxysmal atrial fibrillation (HC Code) (HC CODE) (HC Code)  ? Pneumonia  ? Vitamin deficiency  Past Surgical History: Procedure Laterality Date ? FRACTURE SURGERY   Family History Family history unknown: Yes Social History Socioeconomic History ? Marital status: Widowed Tobacco Use ? Smoking status: Never Smoker ? Smokeless tobacco: Never Used Substance and Sexual Activity ? Alcohol use: No ? Drug use: No ED Other Social History E-cigarette/Vaping Substances E-cigarette/Vaping Devices Review of Systems Constitutional: Positive for appetite change and fatigue. Negative for activity change and diaphoresis. HENT: Negative.  Negative for sore throat.  Eyes: Negative.  Negative for pain.      Macular degeneration Respiratory: Negative.  Negative for wheezing and stridor.  Cardiovascular: Negative.  Negative for chest pain and leg swelling. Gastrointestinal: Negative.  Negative for diarrhea. Genitourinary: Positive for dysuria. Musculoskeletal: Positive for arthralgias. Skin: Negative.  Neurological: Negative.  Negative for syncope.  Physical ExamED Triage Vitals [07/29/21 1129]BP: (!) 110/58Pulse: 66Pulse from  O2 sat: n/aResp: 16Temp: 97.8 ?F (36.6 ?C)Temp src: OralSpO2: 97 % BP 132/61  - Pulse (!) 56  - Temp 97.7 ?F (36.5 ?C) (Temporal)  - Resp 18  - Wt 99.4 kg  - SpO2 96%  - BMI 33.32 kg/m? Physical ExamExam conducted with a chaperone present. Constitutional:     General: He is not in acute distress.   Appearance: Normal appearance. He is  well-developed. He is not diaphoretic. HENT:    Head: Normocephalic and atraumatic.    Right Ear: External ear normal.    Left Ear: External ear normal.    Nose: Nose normal. Eyes:    Extraocular Movements: Extraocular movements intact.    Conjunctiva/sclera: Conjunctivae normal.    Pupils: Pupils are equal, round, and reactive to light.    Comments: Glaucoma Cardiovascular:    Rate and Rhythm: Normal rate.    Pulses: Normal pulses. Pulmonary:    Effort: Pulmonary effort is normal. No respiratory distress.    Comments: Patient is able to speak in full sentences.Patient does not appear to be tachypneic.Respirations do not appear to be laboredAbdominal:    Palpations: Abdomen is soft.    Tenderness: There is no abdominal tenderness. There is no guarding or rebound. Genitourinary:   Penis: Circumcised. No erythema, tenderness or swelling.     Testes: Normal.    Comments: Patient exam or treatment required medical chaperone.The sensitive parts of the examination were performed with chaperone present: Yes, Lanier Clam, RNMusculoskeletal:       General: No swelling, tenderness or deformity. Normal range of motion.    Cervical back: Normal range of motion and neck supple.    Comments: No midline c-spine or spinal tenderness. Skin:   General: Skin is warm and dry.    Findings: No ecchymosis, erythema, rash or wound.    Comments: No decubitus ulcers Neurological:    General: No focal deficit present.    Mental Status: He is alert and oriented to person, place, and time.    Cranial Nerves: No cranial nerve deficit. Radiologic imaging studies:[place] wo IV Contrast Final Result 1. Cerebral and cerebellar atrophy.  2. Patchy white matter lucencies are most likely related to chronic small vessel ischemic changes. Small focus of encephalomalacia in the right occipital lobe most likely reflects a small old infarct. 3. No definite [place] evidence of acute infarct; however, if there is strong clinical suspicion, further evaluation with MRI could be considered as it is more sensitive in the detection of early infarction.  Reported and Signed by:  Silvio Pate, MD   Wrist 3V Right Final Result 1. Diffuse osseous demineralization. 2. No radiographic evidence of acute osseous injury of the right hand or right wrist. 3. Arthritic changes may entirely be related to osteoarthrosis; however, the presence of chondrocalcinosis raises the possibility of superimposed CPPD arthritis. Clinical correlation recommended.  Reported and Signed by:  Silvio Pate, MD   Hand 3V Right Final Result 1. Diffuse osseous demineralization. 2. No radiographic evidence of acute osseous injury of the right hand or right wrist. 3. Arthritic changes may entirely be related to osteoarthrosis; however, the presence of chondrocalcinosis raises the possibility of superimposed CPPD arthritis. Clinical correlation recommended.  Reported and Signed by:  Silvio Pate, MD   CXR (portable) Final Result   Limited study without evidence of active pleural or parenchymal disease. Stable bibasilar fibrosis.  Reported and Signed by:  Geroge Baseman, MD   CARDIAC EKG RESULT SCAN Final Result  Wrist 3V Left    (Results Pending) Hand 3V Left    (Results Pending) Laboratory studies:Results for orders placed or performed during the hospital encounter of 07/29/21 SARS CoV-2 (COVID-19) [facility] ([facility])  Specimen: Nasopharynx; Viral Result Value Ref Range  SARS-CoV-2 RNA (COVID-19)  Negative Negative Influenza A+B/RSV by RT-PCR ([facility])  Specimen: Nasopharynx; Viral Result Value Ref Range  Influenza A Negative Negative  Influenza B  Negative Negative  Respiratory Syncytial Virus Negative Negative Troponin T High Sensitivity, Emergency; 0 hour baseline AND 1 hour with reflex (3 hour) Result Value Ref Range  High Sensitivity Troponin T 44 (H) See Comment ng/L CBC auto differential Result Value Ref Range  WBC 8.5 4.0 - 11.0 x1000/?L  RBC 4.50 4.00 - 6.00 M/?L  Hemoglobin 13.6 13.2 - 17.1 g/dL  Hematocrit 16.10 96.04 - 50.00 %  MCV 92.2 80.0 - 100.0 fL  MCH 30.2 27.0 - 33.0 pg  MCHC 32.8 31.0 - 36.0 g/dL  RDW-CV 54.0 98.1 - 19.1 %  Platelets 174 150 - 420 x1000/?L  MPV 9.9 8.0 - 12.0 fL  Neutrophils 68.9 39.0 - 72.0 %  Lymphocytes 21.4 17.0 - 50.0 %  Monocytes 8.0 4.0 - 12.0 %  Eosinophils 1.1 0.0 - 5.0 %  Basophil 0.2 0.0 - 1.4 %  Immature Granulocytes 0.4 0.0 - 1.0 %  nRBC 0.0 0.0 - 1.0 %  ANC(Abs Neutrophil Count) 5.83 2.00 - 7.60 x 1000/?L  Absolute Lymphocyte Count 1.81 0.60 - 3.70 x 1000/?L  Monocyte Absolute Count 0.68 0.00 - 1.00 x 1000/?L  Eosinophil Absolute Count 0.09 0.00 - 1.00 x 1000/?L  Basophil Absolute Count 0.02 0.00 - 1.00 x 1000/?L  Absolute Immature Granulocyte Count 0.03 0.00 - 0.30 x 1000/?L  Absolute nRBC 0.00 0.00 - 1.00 x 1000/?L Comprehensive metabolic panel Result Value Ref Range  Sodium 139 136 - 145 mmol/L  Potassium 4.3 3.5 - 5.1 mmol/L  Chloride 106 95 - 115 mmol/L  CO2 27 21 - 32 mmol/L  Anion Gap 6 5 - 18  Glucose 162 (H) 70 - 100 mg/dL  BUN 34 (H) 8 - 25 mg/dL  Creatinine 4.78 2.95 - 1.30 mg/dL  Calcium 8.9 8.4 - 62.1 mg/dL  BUN/Creatinine Ratio 30.8 (H) 8.0 - 25.0  Total Protein 6.3 (L) 6.4 - 8.2 g/dL  Albumin 3.2 (L) 3.4 - 5.0 g/dL  Total Bilirubin 0.3 0.0 - 1.0 mg/dL  Alkaline Phosphatase 66 20 - 135 U/L  Alanine Aminotransferase (ALT) 17 12 - 78 U/L  Aspartate Aminotransferase (AST) 12 5 - 37 U/L  Globulin 3.1 g/dL  A/G Ratio 1.0   AST/ALT Ratio 0.7 See Comment  Osmolality Calculation 289 275 - 295 mOsm/kg  eGFR (Creatinine) 56 (L) >=60 mL/min/1.5m2 Lactic acid, plasma (sepsis, reflex 2h repeat) Result Value Ref Range  Lactate 1.6 0.4 - 2.0 mmol/L Urinalysis with CULTURE REFLEX  Specimen: Urine Result Value Ref Range  Clarity, UA Cloudy (A) Clear  Color, UA Yellow Yellow, Colorless  Specific Gravity, UA 1.015 1.005 - 1.030  pH, UA 6.0 5.5 - 7.5  Protein, UA 1+ (A) Negative, Trace  Glucose, UA Negative Negative  Ketones, UA Negative Negative  Blood, UA 2+ (A) Negative  Bilirubin, UA Negative Negative  Leukocytes, UA 4+ (A) Negative  Nitrite, UA Negative Negative  Urobilinogen, UA <2.0 <=2.0 mg/dL Troponin T High Sensitivity, 1 Hour With Reflex ([facility]) Result Value Ref Range  High Sensitivity Troponin T 45 (H) See Comment ng/L  1 hour Delta from 0 Hour, HS-Troponin T 1 ng/L Urine microscopic     ([facility]) Result Value Ref Range  RBC/HPF, UA 15 (H) 0 - 2 /HPF  WBC/HPF, UA 252 (H) 0 - 5 /HPF  Bacteria, UA Rare None-Rare /HPF EKG Result Value Ref Range  Heart Rate 65 bpm  QRS Duration 120 ms  Q-T Interval 472 ms  QTC Calculation(Bezet) 490 ms  P Axis  R Axis -66 deg  T Axis 19 deg  P-R Interval    ECG - SEVERITY Abnormal ECG severity  ProceduresProcedures ED COURSEObtain history from someone other than the patient: yes (daughter)Reviewed previous: previous chart and labs (paper chart from [facility])Interpreted by ED Provider: [place] scan, x-ray, labs, ECG and pulse oximetryDiscuss the patient with other providers: yes ([place])ECG interpreted by attending? interpreted by ED physicianECG Comments: A flutter.Patient Reevaluation: 87 y.o. male presenting with generalized fatigue and decreased PO intake as well as bilateral wrist and thumb pain. Will check labs and [place] head and CXR and reassess. 1:07 PMTroponin elevated, no previous troponin with new assay.Case discussed with Dr Ronita Hipps who accepts pt for admission. 1:19 PMUA positive for UTI.  antibiotics ordered.  Serial troponins. Pt and daughter updated with results and plan.  patient resting comfortably.  admit medicinePatient progress: stableClinical Impressions as of 07/29/21 1442 Atrial flutter, unspecified type (HC Code) (HC CODE) (HC Code)  ED DispositionObservationBy signing my name below, I, Ella Bodo, attest that this documentation has been prepared under the direction and in the presence of Deeksha Cotrell, Margaretmary Bayley, MD Electronically Signed: Ella Bodo, scribe. 07/29/2021. 12:49 PM.I, Madie Reno, MD, personally performed the services described in this documentation. All medical record entries made by the scribe were at my direction and in my presence. I have reviewed the chart and discharge instructions and agree that the record reflects my personal performance and is accurate and complete. Madie Reno, MD. 07/29/2021. 12:49 PM. Madie Reno, MD09/27/22 1433 Madie Reno, MD09/27/22 [phone]

## 2021-07-29 NOTE — Utilization Review (ED)
UM Status: UR Reviewed-Continue Observation Services

## 2021-07-30 LAB — CBC WITHOUT DIFFERENTIAL
BKR WAM ANALYZER ANC: 3.2 x 1000/ÂµL (ref 2.00–7.60)
BKR WAM HEMATOCRIT (2 DEC): 37.5 % — ABNORMAL LOW (ref 38.50–50.00)
BKR WAM HEMOGLOBIN: 12 g/dL — ABNORMAL LOW (ref 13.2–17.1)
BKR WAM MCH (PG): 29.8 pg (ref 27.0–33.0)
BKR WAM MCHC: 32 g/dL (ref 31.0–36.0)
BKR WAM MCV: 93.1 fL (ref 80.0–100.0)
BKR WAM MPV: 9.9 fL (ref 8.0–12.0)
BKR WAM PLATELETS: 151 x1000/ÂµL (ref 150–420)
BKR WAM RDW-CV: 12.8 % (ref 11.0–15.0)
BKR WAM RED BLOOD CELL COUNT.: 4.03 M/ÂµL (ref 4.00–6.00)
BKR WAM WHITE BLOOD CELL COUNT: 6.1 x1000/ÂµL (ref 4.0–11.0)

## 2021-07-30 LAB — URINE CULTURE: BKR URINE CULTURE, ROUTINE: NO GROWTH

## 2021-07-30 LAB — BASIC METABOLIC PANEL
BKR ANION GAP: 7 (ref 5–18)
BKR BLOOD UREA NITROGEN: 25 mg/dL (ref 8–25)
BKR BUN / CREAT RATIO: 26.9 — ABNORMAL HIGH (ref 8.0–25.0)
BKR CALCIUM: 8.2 mg/dL — ABNORMAL LOW (ref 8.4–10.3)
BKR CHLORIDE: 110 mmol/L (ref 95–115)
BKR CO2: 25 mmol/L (ref 21–32)
BKR CREATININE: 0.93 mg/dL (ref 0.50–1.30)
BKR EGFR, CREATININE (CKD-EPI 2021): >60 mL/min/{1.73_m2} (ref >=60)
BKR GLUCOSE: 85 mg/dL (ref 70–100)
BKR OSMOLALITY CALCULATION: 287 mosm/kg (ref 275–295)
BKR POTASSIUM: 4.3 mmol/L (ref 3.5–5.1)
BKR SODIUM: 142 mmol/L (ref 136–145)

## 2021-07-30 NOTE — Plan of Care
Plan of Care Overview/ Patient Status    Spoke with patients daughter [name]-informed her of PT recommendation for home PT.  [name] declined home PT, she spoke with patients caregiver who believes patient is at his baseline.  Dirk Dress BSN, RNNurse Case ManagerYale Natalbany Hospital-Pewamo HospitalMHB:[phone]

## 2021-07-30 NOTE — Utilization Review (ED)
UM Status: UR Reviewed-Continue Observation Services  Unrevealing  Obs work up - kept on IVABT empirically  Anticipating discharge tomorrow.

## 2021-07-30 NOTE — Progress Notes
[facility] Medicine Progress NoteAttending Provider: Jene Every, MD Subjective                                                                              Subjective: Interim History: No events overnightDenies painRequired strait cath x1 for bladder scan 700ccReview of Allergies/Meds/Hx: Review of Allergies/Meds/Hx:I have reviewed the patient's: current scheduled medications, current infusions and current prn medications Objective Objective: Vitals:Most recent: Patient Vitals for the past 24 hrs: BP Temp Temp src Pulse Resp SpO2 Weight 07/30/21 1030 135/78 -- -- (!) 56 18 -- -- 07/30/21 0840 (!) 149/69 97.1 ?F (36.2 ?C) Axillary 63 18 95 % -- 07/29/21 1953 (!) 135/93 97.7 ?F (36.5 ?C) Oral (!) 57 18 98 % -- 07/29/21 1749 (!) 147/78 98 ?F (36.7 ?C) Oral 68 20 99 % -- 07/29/21 1624 125/87 -- -- 60 17 99 % -- 07/29/21 1500 -- -- -- -- 18 -- -- 07/29/21 1424 132/61 -- -- (!) 56 18 96 % -- 07/29/21 1204 129/82 97.7 ?F (36.5 ?C) Temporal 78 18 98 % 99.4 kg 07/29/21 1129 (!) 110/58 97.8 ?F (36.6 ?C) Oral 66 16 97 % -- Physical Exam GEN;?eldelry male; not in distress; sleepy this am but arousableNEURO;?awake and semi alert, moving all extremitiesHEENT:?dry ?Mucous membranesCHEST; ?clear to auscultation- no wheezing or rhonchiHEART; regular rate and rhythmABD; soft, NTND, no suprapubic TTPExt;??edemaLabs:Last 8 hours: Recent Results (from the past 8 hour(s)) CBC No differential  Collection Time: 07/30/21  6:51 AM Result Value Ref Range  WBC 6.1 4.0 - 11.0 x1000/?L  RBC 4.03 4.00 - 6.00 M/?L  Hemoglobin 12.0 (L) 13.2 - 17.1 g/dL  Hematocrit 65.78 (L) 46.96 - 50.00 %  MCV 93.1 80.0 - 100.0 fL  MCH 29.8 27.0 - 33.0 pg  MCHC 32.0 31.0 - 36.0 g/dL  RDW-CV 29.5 28.4 - 13.2 %  Platelets 151 150 - 420 x1000/?L  MPV 9.9 8.0 - 12.0 fL  ANC(Abs Neutrophil Count) 3.20 2.00 - 7.60 x 1000/?L Basic metabolic panel  Collection Time: 07/30/21  6:51 AM Result Value Ref Range  Sodium 142 136 - 145 mmol/L  Potassium 4.3 3.5 - 5.1 mmol/L  Chloride 110 95 - 115 mmol/L  CO2 25 21 - 32 mmol/L  Anion Gap 7 5 - 18  Glucose 85 70 - 100 mg/dL  BUN 25 8 - 25 mg/dL  Creatinine 4.40 1.02 - 1.30 mg/dL  Calcium 8.2 (L) 8.4 - 10.3 mg/dL  BUN/Creatinine Ratio 72.5 (H) 8.0 - 25.0  Osmolality Calculation 287 275 - 295 mOsm/kg  eGFR (Creatinine) >60 >=60 mL/min/1.48m2 UCX and BCX NGTD from 9/27Imaging studies reviewed Assessment Assessment: 89?yo male, DNR, from the [facility] (assisted living) w/ atrial fibrillation, hypertension, diabetes?not on medication, cavernous malformation, memory loss, hx of COVID, gait disorder, anxiety, ?BPH, hypothyroidism p/w acute sx of b/l hand pain, generalized weakness, apnea  and AMS  Wkup thus far unrevealing. Do not suspect he has UTI?triggering mechanism of acute sx was just urinary retention. He did require straight cath x1 overnightSeems better this AM Plan Plan: #possible UTI-cont empiric ceftriaxone. F/u cx# Hx of HTNContinue PTA Metoprolol. Can hold if bradycardia/pauses?# Hx of Hypothyroidism - - Continue PTA Synthroid (175 mcg qDay)?# Hx of Mood  Disorder -?Olanzapine (2.5 mg qHS), lexapro?# Hx of BPH -- Continue PTA Finasteride (5 mg qDay) and Tamsulosin- monitor Uop, bladder scans and need for staright caths#b/l hand pains-resolved. Imaging unrevealing for acute process. Doreene Eland appropriate DJD noted-CRP neg-tylenol prn#DispoDNRMobilize with PTIf wkup unrevealing and he remains stable, plan for dc tomorrow Electronically Signed:Daleen Steinhaus Ronita Hipps, MD Beeper 14239/28/2022

## 2021-07-30 NOTE — Plan of Care
Plan of Care Overview/ Patient Status    Phone call to patients daughter to introduce role and discuss observation status, no answer, unable to leave message.  Patient is confused, oriented to self only. CM will follow up with family again.  Per chart review patient is from [facility].   1100 spoke with patients caregiver Sherry Ruffing, who has worked with the patient for 13 years.  Antoinette reports patient lives at [facility] with 24/7 private caregivers.  At baseline he ambulates with a walker, he requires assistance with bathing, dressing, and eating.  Antoinette or his daughters bring him to medical appointments.  [facility] nurses at [facility] dispense his medications to him.CM Assessment and Discharge Planning  Flowsheet Row Most Recent Value Case Management Evaluation and Plan  Arrived from prior to admission assisted living facility Admitted from: [facility] Do you have a caregiver? Yes  Lives With Facility Resident, Other (see comments)  [24/7 caregiver] Services Prior to Admission private pay help Private pay help services:  Attendant/companion Patient Requires Care Coordination Intervention Due To discharge planning needs/concerns, change in physical function Prior to Hospitalization: Assistance Needed/DME being used Ambulation Ambulation Assistance/DME: rolling walker Patient's home address verified Yes Living Environment   Lives With Facility Resident, Other (see comments)  [24/7 caregiver] Current Living Arrangements residential facility Source of Clinical History  Patient's clinical history has been reviewed and source of Information is: Medical record, Other (Comment)  [caregiver] Case Manager Attestation  I have reviewed the medical record and completed the above evaluation with the following recommendations. Yes Discharge Planning Coordination Recommendations  Discharge Planning Coordination Recommendations Needs not determined at this time Case Manager reviewed plan of care/ continuum of care need's with  Patient Representative, Interdisciplinary Team  Dirk Dress BSN, RNNurse Case ManagerYale Penney Farms Hospital-Pecos HospitalMHB:[phone]

## 2021-07-30 NOTE — Plan of Care
Plan of Care Overview/ Patient Status    Pt received, a&ox2, pt sleeping , arouses to voice, vss, no c/o pain or discomfort,sob,n/v at present, all due meds given crushed w/apple sauce, pvt caregiver @bedside , pt HR went down to 40s and a pause of 3 sec, on call resident made aware, on RTM, pt resting comfortably, vss, safety precautions maintained, cont to monitor.1400-bladder scan shows 450cc, pt has'nt voided yet, wctm.1600-order placed for foley, 18 Fr indwelling double lumen cath placed , wctm.

## 2021-07-30 NOTE — Plan of Care
Problem: Physical Therapy GoalsGoal: Physical Therapy GoalsDescription: PT GOALS1.Patient will perform transfers with supervision using appropriate assistive device 2. Patient will ambulate a minimum of 75 feet with supervision using appropriate assistive device  Outcome: Initial problem identification Plan of Care Overview/ Patient Status    Inpatient Physical Therapy Evaluation HPI/Precautions - 07/30/21 1300    Date of Visit / Treatment  Date of Visit / Treatment 07/30/21   Note Type Evaluation   Start Time 1300   End Time 1325   Total Treatment Time 25    General Information  Pertinent History Of Current Problem Patient is a 87y/o male who presented to [facility] with B/L hands pain, AMS and apnea.Patient with hx of atrial fibrillation, hypertension, diabetes, cavernous malformation, memory loss, hx of COVID, gait disorder, anxiety,  BPH, hypothyroidism .Imaging B/L hands with no acute process as well as [place] head.   Subjective Agreed to PT   Referring Physician  Diwan   General Observations Found in bed asleep but easily arousable. Private aide present. C/o that he has pain but unable to point when asked   Precautions/Limitations Fall Precautions;Bed alarm;Chair alarm    Weight Bearing Status  Weight Bearing Status WNL - Within normal limits      Social History - 07/30/21 1325    Prior Level of Functioning/Social History  Additional Comments Lives in ALF.Has 24/7 aide at home.PTA was able to amb using RW with sba, assist with adl's.Has wc,shower chair      Vitals/Pain - 07/30/21 1325    Vital Signs and Orthostatic Vital Signs  Vital Signs Free text 152/71 mmhg,95% at RA, 75 bpm   Vital Signs: Pre, During, & Post treatment Pre Vitals;Post Vitals;During Vitals      Pre Treatment O2 Delivery room air   During Treatment O2 Delivery room air   Symptoms Noted During Treatment fatigue   During Treatment Vitals Comments no sob noted      Post Treatment O2 Delivery room air    Pain/Comfort  Pain Comment (Pre/Post Treatment Pain) c/o pain at one point though unable to point where    Patient Coping  Observed Emotional State cooperative;calm      Cognition/Vision - 07/30/21 1325    Cognition  Orientation Level Oriented to person   Following Commands Follows one step commands with repetition   Personal Safety / Judgment Fall risk   Comprehension WFL - Within functional limits   Affect Appropriate for situation      Range of Motion - 07/30/21 1325    Range of Motion  Range of Motion Examination bilateral lower extremity ROM was WFL;bilateral upper extremity ROM was [facility]   Range of Motion Comments some tightness B/L heel cords.      MMT/Musculo - 07/30/21 1325    Musculoskeletal  LUE Muscle Strength Grading 3-->active movement against gravity   RUE Muscle Strength Grading 3-->active movement against gravity   LLE Muscle Strength Grading 4-->active movement against gravity and resistance   RLE Muscle Strength Grading 4-->active movement against gravity and resistance   Musculoskeletal Comments no LE buckling noted.B/L LE ast least 3 to 3+/5  grossly graded      Muscle Tone - 07/30/21 1325    Muscle Tone  Left-Side Extremities Muscle Tone Assessment --   can get rigid     Sensory/Skin - 07/30/21 1325    Sensory Assessment  Sensory Tests Results Unable to assess    Skin Assessment  Skin Assessment See Nursing Documentation      Posture -  07/30/21 1325    Posture, Head/Trunk Alignment  Posture, Head/Trunk Alignment Kyphotic      Balance - 07/30/21 1325    Balance  Sitting Balance: Static  FAIR+     Maintains static position without assist or device, may require Supervision or Verbal Cues (>2 minutes)   Sitting Balance: Dynamic  FAIR Performs dynamic activities through 75% range Contact Guard or partial range (50-75%) with Supervision   Standing Balance: Static POOR      Moderate assist to maintain static position with no Assistive Device   Standing Balance: Dynamic  POOR+   Moves through 1/2 range with minimal assist to right self   Balance Assist Device Rolling walker      Mobility - 07/30/21 1325    Bed Mobility  Rolling/Turning Right - Independence/Assistance Level Moderate assist;Verbal cues;Assist of 1   Rolling/Turning Left - Independence/Assistance Level Moderate assist;Verbal cues;Assist of 1   Rolling/Turning Assist Device Bed rails;Head of bed elevated   Supine-to-Sit Independence/Assistance Level Moderate assist;Verbal cues;Assist of 1   Supine-to-Sit Assist Device Bed rails;Head of bed elevated;Draw pad   Sit-to-Supine Independence/Assistance Level Maximum assist;Verbal cues;Assist of 1   Sit-to-Supine Assist Device Bed rails;Head of bed elevated;Draw pad   Limitations decreased ability to use arms for pushing/pulling;impaired ability to control trunk for mobility;decreased ability to use legs for bridging/pushing   Bed Mobility, Impairments strength decreased;decreased flexibility   Bed Mobility Comments per aide baseline level    Sit-Stand Transfer Training  Sit-to-Stand Transfer Independence/Assistance Level Moderate assist;Verbal cues;Assist of 1   Sit-to-Stand Transfer Assist Device Rolling walker   Stand-to-Sit Transfer Independence/Assistance Level Minimum assist;Verbal cues;Assist of 1   Stand-to-Sit Transfer Assist Device Chartered certified accountant Analysis Concerns cues for hand placement;decreased sequencing ability   Transfer Safety Analysis Impairments impaired balance;decreased flexibility;decreased strength;impaired postural control    Gait Training  Symptoms Noted During/After Treatment  fatigue   Independence/Assistance Level  Moderate assist;Assist of 1;Verbal cues   Assistive Device  Rolling walker   Gait Distance 10 feet   Gait Pattern Analysis step to gait   Gait Analysis Deviations decreased cadence;decreased stride length;increased time in double stance;lateral deviation;decreased toe-to-floor clearance   Gait Analysis Impairments impaired balance;decreased flexibility;decreased strength;impaired postural control   Ambulation distance was limited by: fatigue   Gait Training Comments vc's for HS, redirection of RW    Endurance  Endurance Comments fair      PT Handoff - 07/30/21 1325    Handoff Documentation  Handoff Patient in bed;Bed alarm;Patient instructed to call nursing for mobility;Discussed with nursing   Handoff Comments [facility] elevated      AMPAC Basic Mobility - 07/30/21 1325    PT- AM-PAC - Basic Mobility Screen- How much help from another person do you currently need.....  Turning from your back to your side while in a a flat bed without using rails? 2 - A Lot - Requires a lot of help (maximum to moderate assistance). Can use assistive devices.   Moving from lying on your back to sitting on the side of a flat bed without using bed rails? 2 - A Lot - Requires a lot of help (maximum to moderate assistance). Can use assistive devices.   Moving to and from a bed to a chair (including a wheelchair)? 2 - A Lot - Requires a lot of help (maximum to moderate assistance). Can use assistive devices.   Standing up from a chair using your arms(e.g., wheelchair or bedside chair)? 2 - A Lot -  Requires a lot of help (maximum to moderate assistance). Can use assistive devices.   To walk in a hospital room? 2 - A Lot - Requires a lot of help (maximum to moderate assistance). Can use assistive devices.   Climbing 3-5 steps with a railing? 1 - Total - Requires total assistance or cannot do it at all.   AMPAC Mobility Score 11   TARGET Highest Level of Mobility Mobility Level 4, Transfer to chair      Clinical Impression - 07/30/21 1325    Clinical Impression  Rehab Diagnosis Impaired functional mobilities   Initial Assessment Patient seen for PT today.Private aide present. Upon evaluation,noted able to follow commands. Appears weak and rigid though able to amb at least 74ft with RW x min/mod x 1 for safety.Per aide was sba prior to hospitalization.Patient has RW,wc,shower chair,24/7 aide at home.Recommend home PT and continuation of 24/7 assist at home upon DC for safety and to return to baseline function.CM made aware   Criteria for Skilled Therapeutic Interventions Met yes;treatment indicated   Pathology/Pathophysiology Noted (Describe Specifically for Each System) musculoskeletal   Impairments Found (describe specific impairments) aerobic capacity/endurance;gait;muscle strength;balance;mobility   Functional Limitations in Following Categories (Describe Specific Limitations) mobility      Patient/Family Stated Goals - 07/30/21 1325    Patient/Family Stated Goals  Patient/Family Stated Goal(s) unable to state      Frequency/Equipment Recommendations - 07/30/21 1325    Frequency/Equipment Recommendations  PT Frequency 4x per week   Equipment Needs During Admission/Treatment Rolling walker      Recommendations for IP Admission - 07/30/21 1325    PT Recommendations for Inpatient Admission  Activity/Level of Assist out of bed;assist of 1;with rolling walker   ADL Recommendations bedside commode;assist of 1;with rolling walker   Therapeutic Exercise encourage exercise program issued   Other/Comments OOB daily       Planned Treatment/Interventions - 07/30/21 1325    Planned Treatment / Interventions  Plan for Next Visit progress POC,gait training   General Treatment / Interventions Aerobic Capacity / Endurance Conditioning;Therapeutic Exercise;Discharge Planning   Training Treatment / Interventions Functional Mobility Training;Balance / Gait Training   Education Treatment / Interventions Patient Education / Training      Discharge Summary - 07/30/21 1325    PT Discharge Summary  Physical Therapy Disposition Recommendation Home   Additional Physical Therapy Disposition Recommendations Home Physical Therapy;Home with 24 hour assistance   Equipment Recommendations for Discharge Patient has all necessary durable medical equipment     Kelli Hope, [facility] MedicineGreenwich [facility]

## 2021-07-30 NOTE — Plan of Care
Plan of Care Overview/ Patient Status    Assumed care of patient at 1900 after report. Patient is alert and oriented to self only, confused and asks repeated questions, on RA satting 98%, total care, takes pills crushed in apple sauce, on a pureed diet, VsS. Incontinent of B+B, pt c/o need to void and pressure, BS showed 750ccs retention. Straight catheterization performed, 700ccs removed via SC with CNA present at bedside. Immediate relief of pressure verbalized by patient. Stage 1 to coccyx, healed over scabs noted on left side of face  No c/o SOB, cough, further discomfort or pain. NS running at 75ccs/hr.POC reviewed, safety maintained, personal items at the bedside, bed alarm on and audible, rounding performed, turned and repositioned. Vitals:Temperature: 97.7 ?F (36.5 ?C)Pulse: (!) 57Respirations: 18Blood Pressure: (!) 135/93Oxygen Saturation: 98 %

## 2021-07-30 NOTE — Plan of Care
Adult Speech and Language PathologySwallow Progress Note9/28/2022Patient Name:  Keith MargolinMR#:  [id number] Date of Birth:  1920-01-29Therapist:  [place] Hetvi Shawhan, MS, CCC-SLPReceived order, reviewed chart & attempted swallow eval.  Pt seen lying in bed, snoring.  He was unarousable to voice & touch.  Private aide at bedside reports that pt usually sleeps in the morning and is more awake in the afternoons.  Unable to complete swallow eval at this time.  Will try again when appropriate.  D/w RN.;Plan of Care Overview/ Patient Status

## 2021-07-30 NOTE — Utilization Review (ED)
UM Status: UR Reviewed-Continue Observation Services

## 2021-07-30 NOTE — Progress Notes
[facility]	Progress Note  0Attending Provider: Jene Every, MDSubjective: 24 hour events:No acute events overnight.  Patient's heart rate occasionally dips into the 30s, with 2-3 second pauses intermittently.  Patient has been retaining his urine, and required straight catheterization yesterday evening.  Subjective:Patient seen and examined at bedside.  Patient somnolent, but able to respond that he does not have any pain.  Patient unable to answer other questions.Meds: Scheduled Meds:Current Facility-Administered Medications Medication Dose Route Frequency Provider Last Rate Last Admin ? cefTRIAXone (ROCEPHIN) 1 g in sodium chloride 0.9% PF 10 mL (100 mg/mL)  1 g IV Push Q24H Otto Herb, MD     ? enoxaparin (LOVENOX) syringe 40 mg  40 mg Subcutaneous Daily Otto Herb, MD   40 mg at 07/30/21 0840 ? escitalopram oxalate (LEXAPRO) tablet 10 mg  10 mg Oral Nightly Otto Herb, MD   10 mg at 07/29/21 2143 ? finasteride (PROSCAR) tablet 5 mg  5 mg Oral Daily Otto Herb, MD   5 mg at 07/30/21 0840 ? ketotifen (ZADITOR) 0.025 % (0.035 %) ophthalmic solution 1 drop  1 drop Both Eyes BID Otto Herb, MD   1 drop at 07/30/21 0840 ? latanoprost (XALATAN) 0.005 % ophthalmic solution 1 drop  1 drop Both Eyes Nightly Otto Herb, MD   1 drop at 07/29/21 2144 ? levothyroxine (SYNTHROID, LEVOTHROID) tablet 175 mcg  175 mcg Oral Isaac Bliss, MD   175 mcg at 07/30/21 1610 ? metoprolol succinate (TOPROL-XL) 24 hr tablet 12.5 mg  12.5 mg Oral Daily Otto Herb, MD   12.5 mg at 07/30/21 0840 ? OLANZapine (ZyPREXA) tablet 2.5 mg  2.5 mg Oral Nightly Otto Herb, MD   2.5 mg at 07/29/21 2143 ? pantoprazole (PROTONIX) EC tablet 40 mg  40 mg Oral Daily Otto Herb, MD   40 mg at 07/30/21 0840 ? polyethylene glycol (MIRALAX) packet 17 g  17 g Oral Daily Otto Herb, MD   17 g at 07/30/21 0840 ? sodium chloride 0.9 % flush 3 mL  3 mL IV Push Q8H Otto Herb, MD     ? tamsulosin (FLOMAX) 24 hr capsule 0.8 mg  0.8 mg Oral Nightly Otto Herb, MD   0.8 mg at 07/29/21 2143 ? timolol (TIMOPTIC) 0.5 % ophthalmic solution 1 drop  1 drop Both Eyes Daily Otto Herb, MD   1 drop at 07/30/21 1014 Continuous Infusions:PRN Meds:acetaminophen, sodium chlorideObjective: Vitals:Temp:  [97.1 ?F (36.2 ?C)-98 ?F (36.7 ?C)] 97.1 ?F (36.2 ?C)Pulse:  [56-78] 56Resp:  [16-20] 18BP: (110-149)/(58-93) 135/78SpO2:  [95 %-99 %] 95 %Device (Oxygen Therapy): room air I/O's:Intake/Output Summary (Last 24 hours) at 07/30/2021 1109Last data filed at 07/29/2021 2300Gross per 24 hour Intake -- Output 750 ml Net -750 ml  Physical Exam:Gen:  SomnolentHEENT:  Dry mucous membranes, NC/ATCV: nl S1 and S2, RRR, no m/r/gPulm: CTAB, no wheezes, rales or ronchi. Breathing comfortably. GI: Soft, NT, ND. No guarding or rebound tenderness. Ext: No peripheral edema, WWPNeuro:  Somnolent, unable to respond to questioningLabs: CBC Renal Recent Labs Lab 09/27/221139 09/28/220651 HGB 13.6 12.0* MCV 92.2 93.1 HCT 41.50 37.50* WBC 8.5 6.1 NEUTROPHILS 68.9  --  PLT 174 151 CRP 0.4  --   Recent Labs Lab 09/27/221139 09/28/220651 CREATININE 1.16 0.93 BUN 34* 25 GLU 162* 85 ANIONGAP 6 7  Electrolytes Chest Recent Labs Lab 09/27/221139 09/28/220651 NA 139 142 K 4.3 4.3 CL 106 110 CALCIUM 8.9 8.2*  Recent Labs Lab 09/27/221139 09/27/221253 SARSCOV2  --  Negative TSH 0.676  --  LFTs Clotting Recent Labs Lab 09/27/221139 ALT 17 AST 12 ALKPHOS 66 BILITOT 0.3 ALBUMIN 3.2*  No results for input(s): PTT, LABPROT, INR, FIBRINOGEN in the last 168 hours. 9/27 laboratory results- troponin mildly elevated with negative delta- lactate 1.6- lipid panel within normal limits- CRP 0.4- TSH 0.676Diagnostics/Radiology:9/27 [place] head without IV contrastIMPRESSION:1. Cerebral and cerebellar atrophy. 2. Patchy white matter lucencies are most likely related to chronic small vessel ischemic changes. Small focus of encephalomalacia in the right occipital lobe most likely reflects a small old infarct.3. No definite [place] evidence of acute infarct; however, if there is strong clinical suspicion, further evaluation with MRI could be considered as it is more sensitive in the detection of early infarction.9/27 left hand radiographIMPRESSION: Osteopenia and degenerative changes are demonstrated. No fracture is demonstrated.9/27 right hand radiographIMPRESSION:1. Diffuse osseous demineralization.2. No radiographic evidence of acute osseous injury of the right hand or right wrist.3. Arthritic changes may entirely be related to osteoarthrosis; however, the presence of chondrocalcinosis raises the possibility of superimposed CPPD arthritis. Clinical correlation recommended.9/27 chest radiographIMPRESSION:  Limited study without evidence of active pleural or parenchymal disease. Stable bibasilar fibrosis.Micro:9/27 urine culture: No growth9/27 blood culture:  No growth9/27 COVID: NegativeECG/Tele Events: 9/27: Atrial fibrillation:Left axis deviation:RSR' or QR pattern in V1 suggests right ventricular conduction delay:Inferior infarct, age undetermined:Anterolateral infarct, age undeterminedAssessment & Plan: Attending Provider: Jene Every, MD [phone] y.o. male with PMH of HTN, a fib, DM2, hypothyroidism, mood disorder, memory loss, glaucoma, macular degeneration, and BPH who presents to the hospital with symptoms of AMS, apnea, and fatigue. Patient found to have +UA- will be admitted for further workup and management of possible UTI versus obstruction.  Patient persistently bradycardic with intermittent 2-3 second pauses on telemetry.?#UTIAdmission UA cloudy, 1+ prot, 2+ blood, 4+leuk- Ceftriaxone 1g IV q24h- F/u UCx. Adjust abx pending cx results.?#HTN - PTA metoprolol succinate 12.5mg  PO QD (held for bradycardia)?#Hypothyroidism - PTA levothyroxine PO QD?#BPH/Urinary retention- continue to bladder scan and straight catheterize as needed, consider Foley- PTA finasteride 5mg  PO QD- PTA tamsulosin 0.8mg  PO qhs?#Glaucoma - PTA timolol 0.5% 1 drop OU QD- PTA travoprost 0.004% 1 drop OU QD- not on formulary             - Substitute w/latanoprost 0.005% 1 drop OU qhs?#Mood Disorder - PTA olanzapine 2.5mg  PO qhs?#Misc- SLP eval for dysphagia??DIET: Diet NPO Effective NowFLUIDS: NoneELECTROLYTES: replete PRNPROPHYLAXIS: LVNX, SCDs   ACCESS: PIVDISPOSITION: Medical Floor w/RTM CODE STATUS: No ACLS/BLS?Case to be discussed with?attending physician. A/P subject to change by attending's addendum.?? Primary Emergency Contact: Sommer,[name], Home Phone: [phone]:Delynn Pursley Maisie Fus, MD PGY-1MHB: [phone] 28, 202211:09 AM

## 2021-07-31 DIAGNOSIS — K219 Gastro-esophageal reflux disease without esophagitis: Secondary | ICD-10-CM

## 2021-07-31 DIAGNOSIS — I480 Paroxysmal atrial fibrillation: Secondary | ICD-10-CM

## 2021-07-31 DIAGNOSIS — E785 Hyperlipidemia, unspecified: Secondary | ICD-10-CM

## 2021-07-31 DIAGNOSIS — I4892 Unspecified atrial flutter: Secondary | ICD-10-CM

## 2021-07-31 DIAGNOSIS — H409 Unspecified glaucoma: Secondary | ICD-10-CM

## 2021-07-31 DIAGNOSIS — M19041 Primary osteoarthritis, right hand: Secondary | ICD-10-CM

## 2021-07-31 DIAGNOSIS — N401 Enlarged prostate with lower urinary tract symptoms: Secondary | ICD-10-CM

## 2021-07-31 DIAGNOSIS — N390 Urinary tract infection, site not specified: Secondary | ICD-10-CM

## 2021-07-31 DIAGNOSIS — Z881 Allergy status to other antibiotic agents status: Secondary | ICD-10-CM

## 2021-07-31 DIAGNOSIS — M19031 Primary osteoarthritis, right wrist: Secondary | ICD-10-CM

## 2021-07-31 DIAGNOSIS — Z880 Allergy status to penicillin: Secondary | ICD-10-CM

## 2021-07-31 DIAGNOSIS — E119 Type 2 diabetes mellitus without complications: Secondary | ICD-10-CM

## 2021-07-31 DIAGNOSIS — Z20822 Contact with and (suspected) exposure to covid-19: Secondary | ICD-10-CM

## 2021-07-31 DIAGNOSIS — E039 Hypothyroidism, unspecified: Secondary | ICD-10-CM

## 2021-07-31 DIAGNOSIS — M19032 Primary osteoarthritis, left wrist: Secondary | ICD-10-CM

## 2021-07-31 DIAGNOSIS — I10 Essential (primary) hypertension: Secondary | ICD-10-CM

## 2021-07-31 DIAGNOSIS — Z79890 Hormone replacement therapy (postmenopausal): Secondary | ICD-10-CM

## 2021-07-31 DIAGNOSIS — Z66 Do not resuscitate: Secondary | ICD-10-CM

## 2021-07-31 DIAGNOSIS — R339 Retention of urine, unspecified: Secondary | ICD-10-CM

## 2021-07-31 DIAGNOSIS — Z79899 Other long term (current) drug therapy: Secondary | ICD-10-CM

## 2021-07-31 DIAGNOSIS — Z882 Allergy status to sulfonamides status: Secondary | ICD-10-CM

## 2021-07-31 DIAGNOSIS — Z91011 Allergy to milk products: Secondary | ICD-10-CM

## 2021-07-31 DIAGNOSIS — F39 Unspecified mood [affective] disorder: Secondary | ICD-10-CM

## 2021-07-31 DIAGNOSIS — Z91010 Allergy to peanuts: Secondary | ICD-10-CM

## 2021-07-31 DIAGNOSIS — M85842 Other specified disorders of bone density and structure, left hand: Secondary | ICD-10-CM

## 2021-07-31 DIAGNOSIS — Z91018 Allergy to other foods: Secondary | ICD-10-CM

## 2021-07-31 LAB — CBC WITHOUT DIFFERENTIAL
BKR WAM ANALYZER ANC: 3.77 x 1000/ÂµL (ref 2.00–7.60)
BKR WAM HEMATOCRIT (2 DEC): 38.7 % (ref 38.50–50.00)
BKR WAM HEMOGLOBIN: 12.2 g/dL — ABNORMAL LOW (ref 13.2–17.1)
BKR WAM MCH (PG): 29.7 pg (ref 27.0–33.0)
BKR WAM MCHC: 31.5 g/dL (ref 31.0–36.0)
BKR WAM MCV: 94.2 fL (ref 80.0–100.0)
BKR WAM MPV: 9.9 fL (ref 8.0–12.0)
BKR WAM PLATELETS: 150 x1000/ÂµL (ref 150–420)
BKR WAM RDW-CV: 13 % (ref 11.0–15.0)
BKR WAM RED BLOOD CELL COUNT.: 4.11 M/ÂµL (ref 4.00–6.00)
BKR WAM WHITE BLOOD CELL COUNT: 6.9 x1000/ÂµL (ref 4.0–11.0)

## 2021-07-31 LAB — BASIC METABOLIC PANEL
BKR ANION GAP: 5 (ref 5–18)
BKR BLOOD UREA NITROGEN: 28 mg/dL — ABNORMAL HIGH (ref 8–25)
BKR BUN / CREAT RATIO: 27.2 — ABNORMAL HIGH (ref 8.0–25.0)
BKR CALCIUM: 8.2 mg/dL — ABNORMAL LOW (ref 8.4–10.3)
BKR CHLORIDE: 108 mmol/L (ref 95–115)
BKR CO2: 28 mmol/L (ref 21–32)
BKR CREATININE: 1.03 mg/dL (ref 0.50–1.30)
BKR EGFR, CREATININE (CKD-EPI 2021): >60 mL/min/{1.73_m2} (ref >=60)
BKR GLUCOSE: 91 mg/dL (ref 70–100)
BKR OSMOLALITY CALCULATION: 286 mosm/kg (ref 275–295)
BKR POTASSIUM: 4.3 mmol/L (ref 3.5–5.1)
BKR SODIUM: 141 mmol/L (ref 136–145)

## 2021-07-31 MED ORDER — CARBOXYMETHYLCELLULOSE SODIUM 0.5 % EYE DROPS IN A DROPPERETTE
0.5 % | Freq: Two times a day (BID) | OPHTHALMIC | Status: DC | PRN
Start: 2021-07-31 — End: 2021-07-31
  Administered 2021-07-31: 09:00:00 0.5 % via OPHTHALMIC

## 2021-07-31 NOTE — Plan of Care
Adult Speech and Language PathologySwallow Evaluation9/29/2022Patient Name:  Keith MargolinMR#:  [id number] Date of Birth:  1920-07-14Therapist:  [place] Theodore Rahrig, MS, CCC-SLP SLP IP Adult Clinical/Bedside Swallow Evaluation - 07/31/21 0910    Clinical / Bedside Swallow Evaluation  Type of Study Initial   Reason for Study concern for aspiration risk   Patients Current Diet Puree diet;Thin liquids   Patient Position Bed;Upright        Baseline Coughing Absent        Baseline Throat Clearing Absent        Baseline Breath/Vocal Quality Normal   Saliva Swallows WFL   Pre-Oral Skills / Observations --   pt with poor dentition  Thin Liquid yes        Delivery Straw;Fed w/ assist        Volume single straw sips        Oral Phase Successful straw drinking;Adequate        Pharyngeal Phase Adequate        Overt Signs / Symptoms of Aspiration / Difficulty None   Puree yes        Delivery Other   observed RN giving pt meds crushed in puree       Volume 1/2 tsps at a time        Oral Phase Adequate        Pharyngeal Phase Adequate        Overt Signs / Symptoms of Aspiration / Difficulty None   Soft Solid no   d/n attempt secondary to pt's poor dentition  Clinical Impression Suspected oral phase dysphagia   Clinical Impression Based On: Decreased masticatory ability   Patient should be NPO No   Diet Recommendations Puree;Thin liquids   Medication Administration crushed;with puree   Recommendations Eat slowly;Sit upright with eating and drinking;Small sips / bites;Alternate liquids and solids;Check mouth for pocketing   Patients feeding ability Patient needs to be fed   Aspiration Precautions yes        Precautions include: Head of bed 90 degrees/Chair;Small bolus sizes;Slow feeding;Remain upright;Oral care 3-4 times/daily with a toothbrush   Oral Care Recommendations 4x/day   Discontinue PO if patient experiences: Increased temperature;Increased coughing;Increased fatigue      SLP IP Adult Inpatient Recommendations - 07/31/21 0910    SLP Recommendations for Inpatient Admission  Swallow Recommendations Recommend continue with pureed diet with thin liquids, aspiration precautions & full feed assist.  Further, recommend slow rate, small bite & single sips.  Meds should be given crushed in puree.  Pt MUST be fully awake/alert for any/all PO intake otherwise tray MUST be held. D/w RN & Dr. Maisie Fus via M[facility].     Plan of Care Overview/ Patient Status

## 2021-07-31 NOTE — Progress Notes
[facility] Progress NoteAttending Provider: Jene Every, MD Subjective                                                                              Subjective: Interim History: No events overnightDenies pain, appears more alertFoley placed last night for repeated straight cath and concern for retentionReview of Allergies/Meds/Hx: Review of Allergies/Meds/Hx:I have reviewed the patient's: current scheduled medications, current infusions and current prn medications Objective Objective: Vitals:Most recent: Patient Vitals for the past 24 hrs: BP Temp Temp src Pulse Resp SpO2 07/31/21 0811 130/65 97.3 ?F (36.3 ?C) Rectal 65 17 96 % 07/30/21 1925 117/80 98.5 ?F (36.9 ?C) Oral (!) 59 18 95 % Physical Exam GEN;?eldelry male; not in distress; sleepy this am but arousableNEURO;?awake and semi alert, moving all extremitiesHEENT:?dry ?Mucous membranesCHEST; ?clear to auscultation- no wheezing or rhonchiHEART; regular rate and rhythmABD; soft, NTND, no suprapubic TTPExt;??traceedemaGU: foley in place w clear yellow urineLabs:Last 8 hours: Recent Results (from the past 8 hour(s)) CBC No differential  Collection Time: 07/31/21  6:32 AM Result Value Ref Range  WBC 6.9 4.0 - 11.0 x1000/?L  RBC 4.11 4.00 - 6.00 M/?L  Hemoglobin 12.2 (L) 13.2 - 17.1 g/dL  Hematocrit 16.10 96.04 - 50.00 %  MCV 94.2 80.0 - 100.0 fL  MCH 29.7 27.0 - 33.0 pg  MCHC 31.5 31.0 - 36.0 g/dL  RDW-CV 54.0 98.1 - 19.1 %  Platelets 150 150 - 420 x1000/?L  MPV 9.9 8.0 - 12.0 fL  ANC(Abs Neutrophil Count) 3.77 2.00 - 7.60 x 1000/?L Basic metabolic panel  Collection Time: 07/31/21  6:32 AM Result Value Ref Range  Sodium 141 136 - 145 mmol/L  Potassium 4.3 3.5 - 5.1 mmol/L  Chloride 108 95 - 115 mmol/L  CO2 28 21 - 32 mmol/L  Anion Gap 5 5 - 18  Glucose 91 70 - 100 mg/dL  BUN 28 (H) 8 - 25 mg/dL  Creatinine 4.78 2.95 - 1.30 mg/dL  Calcium 8.2 (L) 8.4 - 10.3 mg/dL  BUN/Creatinine Ratio 62.1 (H) 8.0 - 25.0  Osmolality Calculation 286 275 - 295 mOsm/kg  eGFR (Creatinine) >60 >=60 mL/min/1.44m2 UCX and BCX NGTD from 9/27Imaging studies reviewed Assessment Assessment: 89?yo male, DNR, from the [facility] (assisted living) w/ atrial fibrillation, hypertension, diabetes?not on medication, cavernous malformation, memory loss, hx of COVID, gait disorder, anxiety, ?BPH, hypothyroidism p/w acute sx of b/l hand pain, generalized weakness, apnea  and AMS  Wkup thus far unrevealing. Do not suspect he has UTI?triggering mechanism of acute sx was just urinary retention. He did require straight cath x2 Foley placedSeems better this AM Plan Plan: #possible UTI-cont empiric ceftriaxone but with neg ucx ok to discontinue on dc. No need for further abx on dc# Hx of HTNContinue PTA Metoprolol. Can hold if bradycardia/pauses?# Hx of Hypothyroidism - - Continue PTA Synthroid (175 mcg qDay)?# Hx of Mood Disorder -?Olanzapine (2.5 mg qHS), lexapro?# Hx of BPH/retention-- Continue PTA Finasteride (5 mg qDay) and Tamsulosin- foley placed. Should go home w foley and f/u w GU Dr Carlena Sax in 1 week for tov. If pt/family insistent on not keeping it, ok to dc here with close f/u wit GU as opt#b/l hand pains-resolved. Imaging unrevealing for  acute process. Agre appropriate DJD noted-CRP neg-tylenol prn#DispoDNRMobilize with PTDC planning. Can go back to [facility] today if arrangements made Electronically Signed:Donicia Druck Ronita Hipps, MD Beeper 14239/29/2022

## 2021-07-31 NOTE — Care Coordination-Inpatient
Asked by [facility] Bhavya Eschete S to arrange for pt dc back to [facility] today. Pt has 24 hr aide there. I spoke with dtr Zykiria Bruening who is very pleased that he will be going home today. His aide is with him here and she will be driving pt home. dtr not intrsted in STR or home care. There is RN availability at ALF. Dtr asks to speak with MD prior to dc. I will pass this on to Dr Ronita Hipps. dtr does not want pt to go home with a foley. She said she discussed this with MD yesterday.

## 2021-07-31 NOTE — Discharge Summary
[facility] Dis[facility] SummaryPatient Data:  Patient Name: Keith Strickland Admit date: 07/29/2021 Age: 87 y.o. Discharge date:  07/31/2021 DOB: 07-09-1919	 Discharge Attending Physician: Jene Every, MD  MRN: [id number]	 Discharged Condition: fair PCP: Lovena Le, MD Disposition: Home with Homecare Services Principal Diagnosis: Urinary retentionIssues to be Addressed Post Discharge: Issues to be Addressed Post Discharge:1.	Follow up with Dr. Carlena Sax outpatient for urinary retentionRelevant Medications on Discharge:None -- no new medications from this hospitalizationPending Labs and Tests: Pending Lab Results   Order Current Status  Blood culture Preliminary result  Blood culture Preliminary result  Follow-up Information:Lovena Le, MD[address] [place] [id number] call Dr. Trinda Pascal office and schedule a follow up appointment Mila Homer, MD[address] [facility] [place] [phone] an appointment as soon as possible for a visit in 1 week(s)Please make an appointment to see Dr. Carlena Sax in within a week. No future appointments.Hospital Course: Hospital Course: 86 y.o. male (resident of [facility]) with a PMH of HTN, afib, DM2, memory loss, glaucoma, macular degeneration, and BPH who presented to the hospital with symptoms of AMS, apnea, and fatigue. Patient's daughter reported that patient had bilateral wrist pain and R thumb pain. He had also had decreased PO intake.  Had not had any F/C/N/V, chest pain, shortness of breath, or abdominal pain.?While in the ED, vitals were Temp 97.56F, BP 110/58, HR 66, RR 16, SpO2 97%. Initial labs included BUN 34, glucose 162, TProt 6.3, Alb 3.2, HST 44 > 45, UA RBC 15, WBC 252, cloudy, prot 1+, blood 2+, leuk 4+. EKG revealed aflutter. Initial imaging included CXR w/stable bibasilar fibrosis, XR R hand + wrist remarkable for diffuse osseous demineralization and arthritic changes, and XR L hand + wrist remarkable for osteopenia and degenerative changes,  CTH showing cerebral and cerebellar atrophy, patchy white matter lucencies are most likely related to chronic small vessel ischemic changes + small focus of encephalomalacia in R occipital lobe likely reflecting small old infarct. Treatment in the ED included ceftriaxone 1g IVx1.Patient was admitted to the hospital and continued on ceftriaxone in case of urinary tract infection.  Patient was bradycardic throughout his admission, but per his home aide that is baseline.  Patient had difficulty urinating while in the hospital, and was found to be retaining on bladder scan.  Patient required straight catheterization, and had failed to void on his own by day 2 of hospitalization.  Accordingly, primary team spoke with patient's urologist, who recommended placing Foley, which can remain until outpatient follow-up in the urologist office.  Status post Foley, patient's clinical condition and mental status improved markedly, and today his aide feels as though he is at his baseline.  Given that the patient has not had a fever, or an elevated white count, it is suspected that the patient's urinary retention was the etiology of his altered mental status lethargy.  Accordingly, plan is to stop antibiotic therapy on discharge.Patient has now stable and ready for discharge.  Patient will return home with a Foley catheter, and follow-up with his outpatient urologist for further management of his urinary retention.Pertinent lab findings and test results: Objective: Recent Labs Lab 09/27/221139 09/28/220651 09/29/220632 WBC 8.5 6.1 6.9 HGB 13.6 12.0* 12.2* HCT 41.50 37.50* 38.70 PLT 174 151 150  Recent Labs Lab 09/27/221139 NEUTROPHILS 68.9  Recent Labs Lab 09/27/221139 09/28/220651 09/29/220632 NA 139 142 141 K 4.3 4.3 4.3 CL 106 110 108 CO2 27 25 28  BUN 34* 25 28* CREATININE 1.16 0.93 1.03 GLU 162* 85 91 ANIONGAP 6 7 5   Recent Labs  Lab 09/27/221139 09/28/220651 09/29/220632 CALCIUM 8.9 8.2* 8.2*  Recent Labs Lab 09/27/221139 ALT 17 AST 12 ALKPHOS 66 BILITOT 0.3  No results for input(s): PTT, LABPROT, INR in the last 168 hours. Culture Information:Recent Labs Lab 09/27/221253 LABBLOO No Growth to Date - No Growth to Date LABURIN No Growth Imaging: Imaging results last 1 week:  CXR (portable)Result Date: 07/29/2021  Limited study without evidence of active pleural or parenchymal disease. Stable bibasilar fibrosis. Reported and Signed by:  Geroge Baseman, MD Wrist 3V LeftResult Date: 07/29/2021 Osteopenia and osteoarthritic changes are demonstrated. No acute fracture is seen. Reported and Signed by:  Cheri Guppy, MD Wrist 3V RightResult Date: 9/27/20221. Diffuse osseous demineralization. 2. No radiographic evidence of acute osseous injury of the right hand or right wrist. 3. Arthritic changes may entirely be related to osteoarthrosis; however, the presence of chondrocalcinosis raises the possibility of superimposed CPPD arthritis. Clinical correlation recommended. Reported and Signed by:  Silvio Pate, MD Hand 3V LeftResult Date: 07/29/2021 Osteopenia and degenerative changes are demonstrated. No fracture is demonstrated. Reported and Signed by:  Cheri Guppy, MD Hand 3V RightResult Date: 9/27/20221. Diffuse osseous demineralization. 2. No radiographic evidence of acute osseous injury of the right hand or right wrist. 3. Arthritic changes may entirely be related to osteoarthrosis; however, the presence of chondrocalcinosis raises the possibility of superimposed CPPD arthritis. Clinical correlation recommended. Reported and Signed by:  Silvio Pate, MD Ruth Head wo IV ContrastResult Date: 9/27/20221. Cerebral and cerebellar atrophy. 2. Patchy white matter lucencies are most likely related to chronic small vessel ischemic changes. Small focus of encephalomalacia in the right occipital lobe most likely reflects a small old infarct. 3. No definite [place] evidence of acute infarct; however, if there is strong clinical suspicion, further evaluation with MRI could be considered as it is more sensitive in the detection of early infarction. Reported and Signed by:  Silvio Pate, MD Diet:  Modified consistency diet (Pureed): blenderized foods, requires no chewingMobility: Highest Level of mobility - ACTUAL: Mobillty Level 1, Lying in bed , AM PAC < 6Physical Therapy Disposition Recommendation: HomeAdditional Physical Therapy Disposition Recommendations: Home Physical Therapy; Home with 24 hour assistancePhysical Exam Discharge vitals: Temp:  [97.3 ?F (36.3 ?C)-98.5 ?F (36.9 ?C)] 97.3 ?F (36.3 ?C)Pulse:  [59-65] 65Resp:  [17-18] 17BP: (117-130)/(65-80) 130/65SpO2:  [95 %-96 %] 96 %Device (Oxygen Therapy): room air Pertinent Findings of Physical Exam: UnremarkableCognitive Status at Discharge: BaselineDischarge Physical Exam:Gen:  Awake and alert, but with eyes closed, NADHEENT:  Dry mucous membranes, NC/ATCV: nl S1 and S2, RRR, no m/r/gPulm: CTAB, no wheezes, rales or ronchi. Breathing comfortably. GI: Soft, NT, ND. No guarding or rebound tenderness. Ext: No peripheral edema, WWPNeuro:  AOx2Allergies Allergies Allergen Reactions ? Gluten Protein Other (See Comments)   intolerance ? Lactose Other (See Comments)   intolerance ? Avelox [Moxifloxacin]  ? Ciprofloxacin  ? Nuts [Peanut]  ? Penicillins  ? Sulfa (Sulfonamide Antibiotics)   PMH PSH Past Medical History: Diagnosis Date ? Anxiety  ? Closed displaced fracture of left femoral neck (HC Code) (HC CODE) (HC Code)  ? GERD (gastroesophageal reflux disease)  ? Glaucoma  ? Hyperlipidemia  ? Hypertension  ? Hypothyroidism  ? IBS (irritable bowel syndrome)  ? Kyphosis  ? Lumbosacral radiculopathy  ? Major depression  ? Osteoarthritis  ? Paroxysmal atrial fibrillation (HC Code) (HC CODE) (HC Code)  ? Pneumonia  ? Vitamin deficiency   Past Surgical History: Procedure Laterality Date ? FRACTURE SURGERY    Social History Family History Social History  Tobacco Use ? Smoking status: Never Smoker ? Smokeless tobacco: Never Used Substance Use Topics ? Alcohol use: No  Family History Family history unknown: Yes  Discharge Medications: Discharge: Current Discharge Medication List  CONTINUE these medications which have NOT CHANGED  Details acetaminophen (TYLENOL) 325 mg tablet Take 2 tablets (650 mg total) by mouth every 6 (six) hours as needed (Pain/Fever).Qty: 100 tablet, Refills: 5  azelastine (OPTIVAR) 0.05 % ophthalmic solution Place 1 drop into both eyes daily.Qty: 6 mL, Refills: 5  cholecalciferol, vitamin D3, (VITAMIN D3) 125 mcg (5,000 unit) tablet Take 5,000 Units by mouth daily.  escitalopram oxalate (LEXAPRO) 10 mg tablet Take 10 mg by mouth nightly.  finasteride (PROSCAR) 5 mg tablet Take 1 tablet (5 mg total) by mouth daily.Qty: 30 tablet, Refills: 5  latanoprost (XALATAN) 0.005 % ophthalmic solution Place 1 drop into both eyes nightly.  levothyroxine (SYNTHROID, LEVOTHROID) 175 mcg tablet Take 1 tablet (175 mcg total) by mouth every morning.Qty: 30 tablet, Refills: 5  magnesium hydroxide (MILK OF MAGNESIA) 400 mg/5 mL suspension Take 30 mLs by mouth daily as needed for constipation.  melatonin 1 mg tablet Take 1 mg by mouth nightly.  metoprolol succinate XL (TOPROL-XL) 25 mg 24 hr tablet Take 0.5 tablets (12.5 mg total) by mouth daily. Take with or immediately following a meal.Qty: 15 tablet, Refills: 5  OLANZapine (ZYPREXA) 2.5 mg tablet Take 1 tablet (2.5 mg total) by mouth nightly.Qty: 30 tablet, Refills: 5  omeprazole (PRILOSEC) 20 mg capsule Take 1 capsule (20 mg total) by mouth 2 times daily (0900, 1700).Qty: 30 capsule, Refills: 5  polyethylene glycol (MIRALAX) 17 gram packet Take 1 packet (17 g total) by mouth daily. Mix in 8 ounces of fluidQty: 30 each, Refills: 5  pravastatin (PRAVACHOL) 20 mg tablet Take 1 tablet (20 mg total) by mouth daily with dinner.Qty: 30 tablet, Refills: 5  propylene glycoL (SYSTANE BALANCE) 0.6 % ophthalmic solution Place 1 drop into both eyes 3 (three) times daily.  tamsulosin (FLOMAX) 0.4 mg 24 hr capsule Take 0.4 mg by mouth 2 (two) times daily.  timolol (TIMOPTIC) 0.5 % ophthalmic solution Place 1 drop into both eyes daily.Qty: 10 mL, Refills: 5  doxycycline hyclate (VIBRAMYCIN) 100 mg capsule Take 1 capsule (100 mg total) by mouth 2 (two) times daily.Qty: 5 capsule, Refills: 0   STOP taking these medications   travoprost (TRAVATAN Z) 0.004 % ophthalmic solution    Electronically Signed:Chenay Nesmith Clovis Pu, MD 07/31/2021 2:11 PM

## 2021-07-31 NOTE — Other
Keith Strickland was discharged via private car accompanied by aide.  Pt discharged with foley. Bag changed to leg bag. Instructions and demonstration provided to aid on how to switch bags, verbalized understanding. Aftercare summary given to aide. No questions and or concerns. Pt escorted off unit in wheelchair via transport. Stable Patient confirmed all belongings returned. Belongings charted in last 7 days: Valuable(s) : None (07/29/2021  6:00 PM)

## 2021-07-31 NOTE — Utilization Review (ED)
UM Status: UR Reviewed-Continue Observation Services Pt with b/l hand pain, generalized weakness, and AMS. Urine culture, no growth. Urinary retention, foley in place. B/l hand pain, resolved. PT rec home w/ home PT. Possible discharge today per provider note.

## 2021-07-31 NOTE — Plan of Care
Plan of Care Overview/ Patient Status    Day 2 observation . Per MD , medically stable for DC . TC assistance requested to complete process.

## 2021-07-31 NOTE — Discharge Instructions
During this admission you were cared for by the Internal Medicine Resident team. You were admitted with fatigue and altered mental status which was due to urinary retention. This means that you were having trouble urinating on your own, likely due to chronic BPH. You were treated with several medications which included antibiotics for concern for urinary tract infection.  You also had a foley catheter put in to help drain your bladder.You are now stable for discharge home, but you should continue to pay careful attention to the following: Things to watch out for: Call your doctor if any of the aforementioned symptoms occur: fever, altered mental statusReturn to the Emergency Room if if any of the aforementioned symptoms occur. Medications: No new medicationsDiet: Specific recommendations include: pureed diet/dysphagia cautionsFollow up:You should always see your physician within 7 days of hospital discharge. Please schedule an appointment to see your urologist, Keith Strickland, in the next week.If you have any serious concerns after discharge, call your primary care physician or return to the Emergency Department.Thank you for allowing [place] to take part in your care. We wish you a speedy recovery.

## 2021-07-31 NOTE — Plan of Care
Plan of Care Overview/ Patient Status    Assumed care of patient at 1900 after report.  Patient is alert and oriented to self only, pleasantly confused and asks repeated questions, on RA satting 98%, total care, takes pills crushed in apple sauce, on a pureed diet, VsS. Incontinent of B+B, foley catheter in place draining cloudy yellow urine. 900cc urine drained from foley. Healing stage 1 to coccyx, crusted over dark scabs noted on left side of face.2100 patient c/o right eye pain, cool compress applied, tylenol given crushed in apple sauce. Provider Moumin made aware. Patient resting comfortably upon pain reassessment.   9147 patient reporting bilateral eye pain & itchiness. Artifical tears ordered with mild/moderate effectNo c/o SOB, cough, further discomfort or pain.  POC reviewed, safety maintained, personal items at the bedside, bed alarm on and audible, rounding performed, turned and repositioned. Vitals:Temperature: 98.5 ?F (36.9 ?C)Pulse: (!) 59Respirations: 18Blood Pressure: 117/80Oxygen Saturation: 95 %

## 2021-08-03 LAB — BLOOD CULTURE   (BH GH L LMW YH)
BKR BLOOD CULTURE: NO GROWTH
BKR BLOOD CULTURE: NO GROWTH

## 2021-12-17 ENCOUNTER — Inpatient Hospital Stay: Admit: 2021-12-17 | Payer: MEDICARE

## 2021-12-17 ENCOUNTER — Emergency Department: Admit: 2021-12-17 | Payer: MEDICARE | Primary: Internal Medicine

## 2021-12-17 ENCOUNTER — Inpatient Hospital Stay
Admit: 2021-12-17 | Discharge: 2021-12-23 | Payer: MEDICARE | Attending: Internal Medicine | Admitting: Student in an Organized Health Care Education/Training Program

## 2021-12-17 ENCOUNTER — Encounter: Admit: 2021-12-17 | Payer: PRIVATE HEALTH INSURANCE | Attending: Internal Medicine | Primary: Internal Medicine

## 2021-12-17 DIAGNOSIS — M5417 Radiculopathy, lumbosacral region: Secondary | ICD-10-CM

## 2021-12-17 DIAGNOSIS — M40209 Unspecified kyphosis, site unspecified: Secondary | ICD-10-CM

## 2021-12-17 DIAGNOSIS — F329 Major depressive disorder, single episode, unspecified: Secondary | ICD-10-CM

## 2021-12-17 DIAGNOSIS — M1990 Unspecified osteoarthritis, unspecified site: Secondary | ICD-10-CM

## 2021-12-17 DIAGNOSIS — I10 Essential (primary) hypertension: Secondary | ICD-10-CM

## 2021-12-17 DIAGNOSIS — I480 Paroxysmal atrial fibrillation: Secondary | ICD-10-CM

## 2021-12-17 DIAGNOSIS — E039 Hypothyroidism, unspecified: Secondary | ICD-10-CM

## 2021-12-17 DIAGNOSIS — K219 Gastro-esophageal reflux disease without esophagitis: Secondary | ICD-10-CM

## 2021-12-17 DIAGNOSIS — E569 Vitamin deficiency, unspecified: Secondary | ICD-10-CM

## 2021-12-17 DIAGNOSIS — H409 Unspecified glaucoma: Secondary | ICD-10-CM

## 2021-12-17 DIAGNOSIS — K589 Irritable bowel syndrome without diarrhea: Secondary | ICD-10-CM

## 2021-12-17 DIAGNOSIS — E785 Hyperlipidemia, unspecified: Secondary | ICD-10-CM

## 2021-12-17 DIAGNOSIS — S72002A Fracture of unspecified part of neck of left femur, initial encounter for closed fracture: Secondary | ICD-10-CM

## 2021-12-17 DIAGNOSIS — J189 Pneumonia, unspecified organism: Secondary | ICD-10-CM

## 2021-12-17 DIAGNOSIS — F419 Anxiety disorder, unspecified: Secondary | ICD-10-CM

## 2021-12-17 LAB — COMPREHENSIVE METABOLIC PANEL
BKR A/G RATIO: 0.7
BKR ALANINE AMINOTRANSFERASE (ALT): 16 U/L (ref 12–78)
BKR ALBUMIN: 2.8 g/dL — ABNORMAL LOW (ref 3.4–5.0)
BKR ALKALINE PHOSPHATASE: 57 U/L (ref 20–135)
BKR ANION GAP: 8 (ref 5–18)
BKR ASPARTATE AMINOTRANSFERASE (AST): 10 U/L (ref 5–37)
BKR AST/ALT RATIO: 0.6
BKR BILIRUBIN TOTAL: 0.7 mg/dL (ref 0.0–1.0)
BKR BLOOD UREA NITROGEN: 57 mg/dL — ABNORMAL HIGH (ref 8–25)
BKR BUN / CREAT RATIO: 36.5 — ABNORMAL HIGH (ref 8.0–25.0)
BKR CALCIUM: 9.3 mg/dL (ref 8.4–10.3)
BKR CHLORIDE: 115 mmol/L (ref 95–115)
BKR CO2: 25 mmol/L (ref 21–32)
BKR CREATININE: 1.56 mg/dL — ABNORMAL HIGH (ref 0.50–1.30)
BKR EGFR, CREATININE (CKD-EPI 2021): 39 mL/min/{1.73_m2} — ABNORMAL LOW (ref >=60)
BKR GLOBULIN: 3.9 g/dL
BKR GLUCOSE: 162 mg/dL — ABNORMAL HIGH (ref 70–100)
BKR OSMOLALITY CALCULATION: 314 mosm/kg — ABNORMAL HIGH (ref 275–295)
BKR POTASSIUM: 3.9 mmol/L (ref 3.5–5.1)
BKR PROTEIN TOTAL: 6.7 g/dL (ref 6.4–8.2)
BKR SODIUM: 148 mmol/L — ABNORMAL HIGH (ref 136–145)

## 2021-12-17 LAB — BLOOD GAS, ARTERIAL     (GH)
BKR BASE EXCESS ARTERIAL: 1 mmol/L (ref -2–2)
BKR CARBOXYHEMOGLOBIN, ARTERIAL: 1.2 % (ref 0.5–1.5)
BKR HCO3, ARTERIAL: 24.7 mmol/L (ref 20.0–26.0)
BKR METHEMOGLOBIN, ARTERIAL: 1 % (ref 0.0–1.5)
BKR O2 SATURATION, ARTERIAL: 98 % — ABNORMAL HIGH (ref 95–97)
BKR PATIENT TEMP: 37 Cel
BKR PCO2 ARTERIAL: 39 mmHg (ref 35–45)
BKR PH, ARTERIAL: 7.42 U (ref 7.35–7.45)
BKR PO2, ARTERIAL: 107 mmHg — ABNORMAL HIGH (ref 75–100)

## 2021-12-17 LAB — CBC WITH AUTO DIFFERENTIAL
BKR WAM ABSOLUTE IMMATURE GRANULOCYTES.: 0.04 x 1000/ÂµL (ref 0.00–0.30)
BKR WAM ABSOLUTE LYMPHOCYTE COUNT.: 1.12 x 1000/ÂµL (ref 0.60–3.70)
BKR WAM ABSOLUTE NRBC (2 DEC): 0 x 1000/ÂµL (ref 0.00–1.00)
BKR WAM ANALYZER ANC: 9.81 x 1000/ÂµL — ABNORMAL HIGH (ref 2.00–7.60)
BKR WAM BASOPHIL ABSOLUTE COUNT.: 0.01 x 1000/ÂµL (ref 0.00–1.00)
BKR WAM BASOPHILS: 0.1 % (ref 0.0–1.4)
BKR WAM EOSINOPHIL ABSOLUTE COUNT.: 0 x 1000/ÂµL (ref 0.00–1.00)
BKR WAM EOSINOPHILS: 0 % (ref 0.0–5.0)
BKR WAM HEMATOCRIT (2 DEC): 39.7 % (ref 38.50–50.00)
BKR WAM HEMOGLOBIN: 13.2 g/dL (ref 13.2–17.1)
BKR WAM IMMATURE GRANULOCYTES: 0.3 % (ref 0.0–1.0)
BKR WAM LYMPHOCYTES: 9.4 % — ABNORMAL LOW (ref 17.0–50.0)
BKR WAM MCH (PG): 30.9 pg (ref 27.0–33.0)
BKR WAM MCHC: 33.2 g/dL (ref 31.0–36.0)
BKR WAM MCV: 93 fL (ref 80.0–100.0)
BKR WAM MONOCYTE ABSOLUTE COUNT.: 0.89 x 1000/ÂµL (ref 0.00–1.00)
BKR WAM MONOCYTES: 7.5 % (ref 4.0–12.0)
BKR WAM MPV: 9.9 fL (ref 8.0–12.0)
BKR WAM NEUTROPHILS: 82.7 % — ABNORMAL HIGH (ref 39.0–72.0)
BKR WAM NUCLEATED RED BLOOD CELLS: 0 % (ref 0.0–1.0)
BKR WAM PLATELETS: 171 x1000/ÂµL (ref 150–420)
BKR WAM RDW-CV: 13.3 % (ref 11.0–15.0)
BKR WAM RED BLOOD CELL COUNT.: 4.27 M/ÂµL (ref 4.00–6.00)
BKR WAM WHITE BLOOD CELL COUNT: 11.9 x1000/ÂµL — ABNORMAL HIGH (ref 4.0–11.0)

## 2021-12-17 LAB — RESPIRATORY VIRUS PCR PANEL     (BH GH LMW Q YH)
BKR ADENOVIRUS: NOT DETECTED
BKR HUMAN METAPNEUMOVIRUS (HMPV): NOT DETECTED
BKR PARAINFLUENZA VIRUS 1: NOT DETECTED
BKR PARAINFLUENZA VIRUS 2: NOT DETECTED
BKR PARAINFLUENZA VIRUS 3: NOT DETECTED
BKR PARAINFLUENZA VIRUS 4: NOT DETECTED
BKR RESPIRATORY SYNCYTIAL VIRUS A: NOT DETECTED
BKR RESPIRATORY SYNCYTIAL VIRUS B: NOT DETECTED
BKR RHINOVIRUS: NOT DETECTED

## 2021-12-17 LAB — HEMOGLOBIN A1C: BKR HEMOGLOBIN A1C: 5.9 %

## 2021-12-17 LAB — ZZZURINALYSIS WITH CULTURE REFLEX     (L Q)
BKR BILIRUBIN, UA: NEGATIVE
BKR GLUCOSE, UA: NEGATIVE
BKR KETONES, UA: NEGATIVE
BKR NITRITE, UA: NEGATIVE
BKR PH, UA: 8 — ABNORMAL HIGH (ref 5.5–7.5)
BKR SPECIFIC GRAVITY, UA: 1.018 (ref 1.005–1.030)
BKR UROBILINOGEN, UA (MG/DL): <2 mg/dL (ref <=2.0)

## 2021-12-17 LAB — TROPONIN T HIGH SENSITIVITY, 1 HOUR WITH REFLEX (BH GH LMW YH)
BKR TROPONIN T HS 1 HOUR DELTA FROM 0 HOUR: 6 ng/L
BKR TROPONIN T HS 1 HOUR: 68 ng/L — ABNORMAL HIGH

## 2021-12-17 LAB — TROPONIN T HIGH SENSITIVITY, 3 HOUR (BH GH LMW YH)
BKR TROPONIN T HS 1 HOUR DELTA FROM 0 HOUR ON 3HR: 6 ng/L
BKR TROPONIN T HS 3 HOUR DELTA FROM 0 HOUR: -5 ng/L
BKR TROPONIN T HS 3 HOUR: 57 ng/L — ABNORMAL HIGH

## 2021-12-17 LAB — URINE MICROSCOPIC     (BH GH LMW YH)
BKR HYALINE CASTS, UA INSTRUMENT (NUMERIC): 3 /LPF (ref 0–3)
BKR RBC/HPF INSTRUMENT: 6 /HPF — ABNORMAL HIGH (ref 0–2)
BKR URINE SQUAMOUS EPITHELIAL CELLS, UA (NUMERIC): <1 /HPF (ref 0–5)
BKR WBC/HPF INSTRUMENT: 606 /HPF — ABNORMAL HIGH (ref 0–5)

## 2021-12-17 LAB — PROCALCITONIN     (BH GH LMW Q YH)
BKR PROCALCITONIN: 4.27 ng/mL — ABNORMAL HIGH
BKR PROCALCITONIN: 2.95 ng/mL — ABNORMAL HIGH

## 2021-12-17 LAB — INFLUENZA TYPE A/B RNA     (BH GH LMW Q YH)
BKR INFLUENZA A: NEGATIVE
BKR INFLUENZA B: NEGATIVE

## 2021-12-17 LAB — TROPONIN T HIGH SENSITIVITY, 0 HOUR BASELINE WITH REFLEX (BH GH LMW YH): BKR TROPONIN T HS 0 HOUR BASELINE: 62 ng/L — ABNORMAL HIGH

## 2021-12-17 LAB — LACTIC ACID, PLASMA: BKR LACTATE: 1.5 mmol/L (ref 0.4–2.0)

## 2021-12-17 LAB — PROTIME AND INR
BKR CO2: 10.2 seconds (ref 9.4–12.0)
BKR INR: 0.92 (ref 0.89–1.14)
BKR PROTHROMBIN TIME: 10.2 s (ref 9.4–12.0)

## 2021-12-17 LAB — T4, FREE: BKR FREE T4: 1.55 ng/dL — ABNORMAL HIGH (ref 0.76–1.46)

## 2021-12-17 LAB — VITAMIN B12: BKR VITAMIN B12: 621 pg/mL (ref 211–911)

## 2021-12-17 LAB — LACTIC ACID, PLASMA (REFLEX 2H REPEAT): BKR LACTATE: 2.7 mmol/L — ABNORMAL HIGH (ref 0.4–2.0)

## 2021-12-17 LAB — NT-PROBNPE: BKR B-TYPE NATRIURETIC PEPTIDE, PRO (PROBNP): 13796 pg/mL — ABNORMAL HIGH (ref 0.0–1800.0)

## 2021-12-17 LAB — TSH W/REFLEX TO FT4     (BH GH LMW Q YH): BKR THYROID STIMULATING HORMONE: 0.328 u[IU]/mL — ABNORMAL LOW (ref 0.358–3.740)

## 2021-12-17 LAB — SARS COV-2 (COVID-19) RNA: BKR SARS-COV-2 RNA (COVID-19) (YH): NEGATIVE

## 2021-12-17 MED ORDER — DEXTROSE 15 GRAM/60 ML ORAL LIQUID
1560 gram/60 mL | ORAL | Status: DC | PRN
Start: 2021-12-17 — End: 2021-12-23

## 2021-12-17 MED ORDER — FRUIT JUICE
ORAL | Status: DC | PRN
Start: 2021-12-17 — End: 2021-12-23

## 2021-12-17 MED ORDER — METOPROLOL TARTRATE 5 MG/5 ML IV
5 mg/ mL | Freq: Four times a day (QID) | INTRAVENOUS | Status: DC
Start: 2021-12-17 — End: 2021-12-18
  Administered 2021-12-17 – 2021-12-18 (×3): 5 mL via INTRAVENOUS

## 2021-12-17 MED ORDER — ESCITALOPRAM 10 MG TABLET
10 mg | Freq: Every evening | ORAL | Status: DC
Start: 2021-12-17 — End: 2021-12-17

## 2021-12-17 MED ORDER — GLUCAGON 1 MG/ML IN STERILE WATER
Freq: Once | INTRAMUSCULAR | Status: DC | PRN
Start: 2021-12-17 — End: 2021-12-23

## 2021-12-17 MED ORDER — SODIUM CHLORIDE 0.45 % INTRAVENOUS SOLUTION
0.45 % | INTRAVENOUS | Status: DC
Start: 2021-12-17 — End: 2021-12-18
  Administered 2021-12-17 – 2021-12-18 (×2): 0.45 mL/h via INTRAVENOUS

## 2021-12-17 MED ORDER — INSULIN LISPRO 100 UNIT/ML (SLIDING SCALE)
100 unit/mL | Freq: Four times a day (QID) | SUBCUTANEOUS | Status: DC
Start: 2021-12-17 — End: 2021-12-18

## 2021-12-17 MED ORDER — LEVOTHYROXINE 150 MCG TABLET
150 mcg | Freq: Every day | ORAL | Status: DC
Start: 2021-12-17 — End: 2021-12-17

## 2021-12-17 MED ORDER — LEVOTHYROXINE 150 MCG TABLET
150 mcg | Freq: Every morning | ORAL | Status: SS
Start: 2021-12-17 — End: 2022-01-08

## 2021-12-17 MED ORDER — ROSUVASTATIN 5 MG TABLET
5 mg | Freq: Every day | ORAL | Status: DC
Start: 2021-12-17 — End: 2021-12-19

## 2021-12-17 MED ORDER — SODIUM CHLORIDE 0.9 % INTRAVENOUS SOLUTION
INTRAVENOUS | Status: DC
Start: 2021-12-17 — End: 2021-12-17
  Administered 2021-12-17: 19:00:00 via INTRAVENOUS

## 2021-12-17 MED ORDER — HEPARIN (PORCINE) 5,000 UNIT/ML INJECTION SOLUTION
5000 unit/mL | Freq: Three times a day (TID) | SUBCUTANEOUS | Status: DC
Start: 2021-12-17 — End: 2021-12-23
  Administered 2021-12-17 – 2021-12-23 (×18): via SUBCUTANEOUS

## 2021-12-17 MED ORDER — FINASTERIDE 5 MG TABLET
5 mg | Freq: Every day | ORAL | Status: DC
Start: 2021-12-17 — End: 2021-12-19

## 2021-12-17 MED ORDER — AZITHROMYCIN 200 MG/5 ML ORAL SUSPENSION
200 mg/5 mL | ORAL | Status: DC
Start: 2021-12-17 — End: 2021-12-18

## 2021-12-17 MED ORDER — DEXTROSE 10 % IV BOLUS FOR ORDERABLE
INTRAVENOUS | Status: DC | PRN
Start: 2021-12-17 — End: 2021-12-23

## 2021-12-17 MED ORDER — KETOTIFEN 0.025 % (0.035 %) EYE DROPS
0.0250.035 % (0.035 %) | Freq: Every day | OPHTHALMIC | Status: DC
Start: 2021-12-17 — End: 2021-12-23
  Administered 2021-12-19 – 2021-12-23 (×5): 0.025 mL via OPHTHALMIC

## 2021-12-17 MED ORDER — AZITHROMYCIN 500MG IN 250ML IVPB (VIALMATE)
Freq: Once | INTRAVENOUS | Status: CP
Start: 2021-12-17 — End: ?
  Administered 2021-12-17: 16:00:00 250.000 mL/h via INTRAVENOUS

## 2021-12-17 MED ORDER — TAMSULOSIN 0.4 MG CAPSULE
0.4 mg | Freq: Two times a day (BID) | ORAL | Status: DC
Start: 2021-12-17 — End: 2021-12-19

## 2021-12-17 MED ORDER — LATANOPROST 0.005 % EYE DROPS
0.005 % | Freq: Every evening | OPHTHALMIC | Status: DC
Start: 2021-12-17 — End: 2021-12-23
  Administered 2021-12-18 – 2021-12-23 (×5): 0.005 mL via OPHTHALMIC

## 2021-12-17 MED ORDER — ESCITALOPRAM 10 MG TABLET
10 mg | Freq: Every evening | ORAL | Status: SS
Start: 2021-12-17 — End: 2021-12-17

## 2021-12-17 MED ORDER — CEFTRIAXONE IV PUSH 1000 MG VIAL & NS (ADULTS)
INTRAVENOUS | Status: DC
Start: 2021-12-17 — End: 2021-12-23
  Administered 2021-12-18 – 2021-12-23 (×6): 10.000 mL via INTRAVENOUS

## 2021-12-17 MED ORDER — CEFTRIAXONE IV PUSH 1000 MG VIAL & NS (ADULTS)
Freq: Once | INTRAVENOUS | Status: CP
Start: 2021-12-17 — End: ?
  Administered 2021-12-17: 16:00:00 10.000 mL via INTRAVENOUS

## 2021-12-17 MED ORDER — SKIM MILK
ORAL | Status: DC | PRN
Start: 2021-12-17 — End: 2021-12-23

## 2021-12-17 MED ORDER — LEVOTHYROXINE 20 MCG/ML INTRAVENOUS SOLUTION
20 mcg/mL | Freq: Every day | INTRAVENOUS | Status: DC
Start: 2021-12-17 — End: 2021-12-19
  Administered 2021-12-18 – 2021-12-19 (×2): 20 mL via INTRAVENOUS

## 2021-12-17 MED ORDER — POLYETHYLENE GLYCOL 3350 17 GRAM ORAL POWDER PACKET
17 gram | Freq: Every day | ORAL | Status: DC | PRN
Start: 2021-12-17 — End: 2021-12-23

## 2021-12-17 MED ORDER — METOPROLOL SUCCINATE ER (TOPROL) 12.5 MG HALFTAB
12.5 mg | Freq: Every day | ORAL | Status: DC
Start: 2021-12-17 — End: 2021-12-19

## 2021-12-17 MED ORDER — ACETAMINOPHEN 325 MG TABLET
325 mg | Freq: Four times a day (QID) | ORAL | Status: DC | PRN
Start: 2021-12-17 — End: 2021-12-23

## 2021-12-17 MED ORDER — TIMOLOL MALEATE 0.5 % EYE DROPS
0.5 % | Freq: Every day | OPHTHALMIC | Status: DC
Start: 2021-12-17 — End: 2021-12-23
  Administered 2021-12-18 – 2021-12-23 (×6): 0.5 mL via OPHTHALMIC

## 2021-12-17 MED ORDER — SODIUM CHLORIDE 0.9 % BOLUS (NEW BAG)
0.9 % | Freq: Once | INTRAVENOUS | Status: CP
Start: 2021-12-17 — End: ?
  Administered 2021-12-17: 16:00:00 0.9 mL/h via INTRAVENOUS

## 2021-12-17 NOTE — Progress Notes
[facility] Progress NoteAttending Provider: Cindra Presume, * Subjective                                                                              Subjective: Interim History: Pt presenting from [facility] with lethargy, tachypnea, hypoxemia to 90% on RA.Not able to answer questions.Review of Allergies/Meds/Hx: Review of Allergies/Meds/Hx:I have reviewed the patient's: current scheduled medications Objective Objective: Vitals:Last 24 hours: Temp:  [97.9 ?F (36.6 ?C)-99.8 ?F (37.7 ?C)] 97.9 ?F (36.6 ?C)Pulse:  [99-109] 99Resp:  [20-48] 20BP: (114-159)/(65-85) 146/74SpO2:  [85 %-98 %] 95 %Physical ExamVitals reviewed. Constitutional:     Appearance: He is not toxic-appearing or diaphoretic.    Comments: Elderly male lethargic. HENT:    Head: Normocephalic and atraumatic. Eyes:    General: No scleral icterus.Cardiovascular:    Comments: Tachycardic.Pulmonary:    Comments: Breath sounds a bit diminished.Abdominal:    General: Bowel sounds are normal. There is no distension.    Palpations: Abdomen is soft.    Tenderness: There is no abdominal tenderness. Musculoskeletal:    Cervical back: Neck supple.    Right lower leg: No edema.    Left lower leg: No edema. Neurological:    Comments: Lethargic. Psychiatric:    Comments: Cannot assess. Labs:Last 24 hours: Recent Results (from the past 24 hour(s)) Lactic acid, plasma (sepsis, reflex 2h repeat)  Collection Time: 12/17/21  9:20 AM Result Value Ref Range  Lactate 2.7 (H) 0.4 - 2.0 mmol/L Protime and INR  Collection Time: 12/17/21  9:20 AM Result Value Ref Range  Prothrombin Time 10.2 9.4 - 12.0 seconds  INR 0.92 0.89 - 1.14 Blood culture-Collect Blood Cx prior to Antibiotics  Collection Time: 12/17/21  9:20 AM  Specimen: Peripheral; Blood Result Value Ref Range  Blood Culture No Growth to Date Blood culture-Collect Blood Cx prior to Antibiotics  Collection Time: 12/17/21  9:20 AM  Specimen: Peripheral; Blood Result Value Ref Range  Blood Culture No Growth to Date  CBC auto differential  Collection Time: 12/17/21  9:20 AM Result Value Ref Range  WBC 11.9 (H) 4.0 - 11.0 x1000/?L  RBC 4.27 4.00 - 6.00 M/?L  Hemoglobin 13.2 13.2 - 17.1 g/dL  Hematocrit 16.10 96.04 - 50.00 %  MCV 93.0 80.0 - 100.0 fL  MCH 30.9 27.0 - 33.0 pg  MCHC 33.2 31.0 - 36.0 g/dL  RDW-CV 54.0 98.1 - 19.1 %  Platelets 171 150 - 420 x1000/?L  MPV 9.9 8.0 - 12.0 fL  Neutrophils 82.7 (H) 39.0 - 72.0 %  Lymphocytes 9.4 (L) 17.0 - 50.0 %  Monocytes 7.5 4.0 - 12.0 %  Eosinophils 0.0 0.0 - 5.0 %  Basophil 0.1 0.0 - 1.4 %  Immature Granulocytes 0.3 0.0 - 1.0 %  nRBC 0.0 0.0 - 1.0 %  ANC(Abs Neutrophil Count) 9.81 (H) 2.00 - 7.60 x 1000/?L  Absolute Lymphocyte Count 1.12 0.60 - 3.70 x 1000/?L  Monocyte Absolute Count 0.89 0.00 - 1.00 x 1000/?L  Eosinophil Absolute Count 0.00 0.00 - 1.00 x 1000/?L  Basophil Absolute Count 0.01 0.00 - 1.00 x 1000/?L  Absolute Immature Granulocyte Count 0.04 0.00 - 0.30 x 1000/?L  Absolute nRBC 0.00 0.00 - 1.00 x 1000/?L Comprehensive  metabolic panel  Collection Time: 12/17/21  9:20 AM Result Value Ref Range  Sodium 148 (H) 136 - 145 mmol/L  Potassium 3.9 3.5 - 5.1 mmol/L  Chloride 115 95 - 115 mmol/L  CO2 25 21 - 32 mmol/L  Anion Gap 8 5 - 18  Glucose 162 (H) 70 - 100 mg/dL  BUN 57 (H) 8 - 25 mg/dL  Creatinine 8.65 (H) 7.84 - 1.30 mg/dL  Calcium 9.3 8.4 - 69.6 mg/dL  BUN/Creatinine Ratio 29.5 (H) 8.0 - 25.0  Total Protein 6.7 6.4 - 8.2 g/dL  Albumin 2.8 (L) 3.4 - 5.0 g/dL  Total Bilirubin 0.7 0.0 - 1.0 mg/dL  Alkaline Phosphatase 57 20 - 135 U/L  Alanine Aminotransferase (ALT) 16 12 - 78 U/L  Aspartate Aminotransferase (AST) 10 5 - 37 U/L  Globulin 3.9 g/dL  A/G Ratio 0.7   AST/ALT Ratio 0.6 See Comment Osmolality Calculation 314 (H) 275 - 295 mOsm/kg  eGFR (Creatinine) 39 (L) >=60 mL/min/1.29m2 NT-proBrain natriuretic peptide  Collection Time: 12/17/21  9:20 AM Result Value Ref Range  NT-proBNP 13,796.0 (H) 0.0 - 1,800.0 pg/mL SARS CoV-2 (COVID-19) RNA-[place] [facility])  Collection Time: 12/17/21  9:32 AM  Specimen: Nasopharynx; Viral Result Value Ref Range  SARS-CoV-2 RNA (COVID-19)  Negative Negative Influenza type A/B RNA ([facility] LMW Q Y[facility])  Collection Time: 12/17/21  9:32 AM  Specimen: Nasopharynx; Viral Result Value Ref Range  Influenza A Negative Negative  Influenza B Negative Negative Troponin T High Sensitivity, Emergency; 0 hour baseline AND 1 hour with reflex (3 hour)  Collection Time: 12/17/21 10:26 AM Result Value Ref Range  High Sensitivity Troponin T 62 (H) See Comment ng/L Urinalysis with culture reflex  (obtain straight cath if patient unable to provide sample)  Collection Time: 12/17/21 10:35 AM  Specimen: Urine, Clean Catch Result Value Ref Range  Clarity, UA Cloudy (A) Clear  Color, UA Yellow Yellow, Colorless  Specific Gravity, UA 1.018 1.005 - 1.030  pH, UA 8.0 (H) 5.5 - 7.5  Protein, UA 2+ (A) Negative, Trace  Glucose, UA Negative Negative  Ketones, UA Negative Negative  Blood, UA 1+ (A) Negative  Bilirubin, UA Negative Negative  Leukocytes, UA 4+ (A) Negative  Nitrite, UA Negative Negative  Urobilinogen, UA <2.0 <=2.0 mg/dL Urine microscopic     ([facility] LMW [facility])  Collection Time: 12/17/21 10:35 AM Result Value Ref Range  RBC/HPF, UA 6 (H) 0 - 2 /HPF  WBC/HPF, UA 606 (H) 0 - 5 /HPF  Bacteria, UA Rare None-Rare /HPF  Budding Yeast, UA Few (A) None /HPF  Urine Squamous Epithelial Cells, UA <1 0 - 5 /HPF  Hyaline Casts, UA 3 0 - 3 /LPF  Ca Oxalate Crys, UA Rare (A) None /HPF Blood gas, arterial  Collection Time: 12/17/21 10:37 AM Result Value Ref Range  Site LEFT RADIAL   pH Arterial 7.42 7.35 - 7.45 units  pCO2, Arterial 39 35 - 45 mmHg  pO2, Arterial 107 (H) 75 - 100 mmHg  Calculated HCO3, Arterial 24.7 20.0 - 26.0 mmol/L  Base Excess, Arterial 1 -2 - 2 mmol/L  Patient Temperature 37.0 Celsius  O2 Sat, Arterial 98 (H) 95 - 97 %  Carboxyhemoglobin, Arterial 1.2 0.5 - 1.5 %  Methemoglobin, Arterial 1.0 0.0 - 1.5 %  Appliance NASAL CANNULA 2 LPM  Troponin T High Sensitivity, 1 Hour With Reflex ([facility] LMW Y[facility])  Collection Time: 12/17/21 11:13 AM Result Value Ref Range  High Sensitivity Troponin T 68 (  H) See Comment ng/L  1 hour Delta from 0 Hour, HS-Troponin T 6 ng/L Labs reviewedDiagnostics:CXR (12/17/21):1. Opacities the left mid and right lower lung zones concerning for pneumonia in the setting of sepsis. 2. Moderate left pleural effusion. ECG/Tele Events: ECG: AFib 92 bpm, QTc 502 ms, no stemi Assessment Assessment: Assessment:88 yo male, resident at [facility], with PMH of AFib (not on A/C), DM (not on meds), HTN, BPH, anxiety, hypothyroidism, cavernous malformation, small traumatic intraparenchymal hemorrhage in 2019, memory loss, gait abnormality.Presented from home with lethargy, tachypnea, hypoxemia. Seems to have bilateral pneumonia. Question sepsis given lethargy (encephalopathy), AKI (cr 1.5 from normal baseline), tachypnea and tachycardia. Retaining urine in ER, had straight cath? Plan Plan: Acute hypoxemic resp failure, bilateral pneumonia- Cont O2 supper with NC - on 2L/min- Cont abx Ceftiaxone iv and Azithromycin iv/po- Suction prn (required some in ER)- NPO, hydrate with fluids- SLP eval when more awake- BCx (2/15) - PEND- Send sputum Cx, MRSA Cx, Legio-Strep urine Ag- Procal - 4.27- WBC - 11.9kSepsis?- Abx, iv fluids and cultures as above and below- Likely due to PNA- UA with wbc 600s, LE, some yeast - UCx - PENDAKI (cr 1.5 from normal baseline), hypernatremia 148Urine retention?- Given 1L of NS in ER; cont 1/2-NS 64ml/h- Cr - 1.56- Na - 148- Renal US- Bladder scansAcute encephalopathy; hx of dementia- Treat acute medical issues- If not resolving, may do a [place] scan- HOLD Lexapro 10mg  hs, Melatonin qhs and Zyprexa 2.5mg  qhs (lethargy)- Also check TSH and B12 - PENDHTN, HLD - Cont Toprol 25mg  and Pravastatin 20mg  qd qd when taking poDM type 2 - Check A1c - PENDING; give SSIBPH - Cont Proscar 5mg  qd and Flomax 0.4mg  bid when taking poHypothyroidism - Cont L-thyroxine po when taking po; if not taking po in 2 days, give IVDVT ppx - Lovenox / HSQCode status: DNRDisposition:- Admit to team 3 housestaff, tele unit- PT consult Electronically Signed:Breanna Mcdaniel Jordan Likes, MD Beeper 17852/15/2023

## 2021-12-17 NOTE — ED Notes
11:23 AM R tat bedside, deep nasal suctioning performed. Os sat improved to 97% on 3L NC

## 2021-12-17 NOTE — ACP (Advance Care Planning)
During admission, writer spoke to daughter, Stanton Kidney, regarding goals of care. Patient was unable to participate in the conversation due to acute encephalopathy from present illness. Stanton Kidney notes that the patient is a DNR/DNI, which means she would not want the patient to be intubated or receive chest compressions. Additionally, Stanton Kidney stated that the patient would not want invasive measures, such as a tracheostomy. Documentation of a living will from 01/27/2019 notes that the patient would not want heroic measures if they will prolong death or have him impaired. This has been reflected in DNR/DNI code statuses for the past 3 years. Code status subsequently updated to DNR/DNI. Electronically Signed:Dr. Roylene Reason, DO PGY-2Date: 2/15/2023Time: 4:46 PMPager # HeartBeat # [phone]

## 2021-12-17 NOTE — Utilization Review (ED)
UM Status: Meets Inpatient Status AMS, Dyspnea, Multifocal pneumonia, Sepsis, IV abx, IVFJulianna DiMichele Murphy, BSN, RNUtilization ManagementMHB [phone]

## 2021-12-17 NOTE — Other
PHARMACY-ASSISTED MEDICATION REPORTPharmacist review of the best possible medication history obtained by the pharmacy medication history technician has been performed.  I have updated the home medication list and identified the following information that may be relevant to this admission. Recommendations:  N/A Notes:N/AHome medication list updated and reviewed. Medication history completed through review of patient's W10 provided by [facility], 12/17/2021 dated and called Nurse at facility [phone].Current Medications Medication Sig Note  acetaminophen (TYLENOL) 325 mg tablet Take 2 tablets (650 mg total) by mouth every 6 (six) hours as needed (Pain/Fever).   azelastine (OPTIVAR) 0.05 % ophthalmic solution Place 1 drop into both eyes daily.   cholecalciferol, vitamin D3, 125 mcg (5,000 unit) capsule Take 1 capsule (5,000 Units total) by mouth. In the morning every Tuesday and Friday.   escitalopram oxalate (LEXAPRO) 10 mg tablet Take 1 tablet (10 mg total) by mouth at bedtime.   finasteride (PROSCAR) 5 mg tablet Take 1 tablet (5 mg total) by mouth daily.   latanoprost (XALATAN) 0.005 % ophthalmic solution Place 1 drop into both eyes nightly.   levothyroxine (SYNTHROID, LEVOTHROID) 150 mcg tablet Take 1 tablet (150 mcg total) by mouth every morning. On an empty stomach, at least 30 minutes before breakfast.   magnesium hydroxide (MILK OF MAGNESIA) 400 mg/5 mL suspension Take 30 mLs by mouth daily as needed for constipation.   melatonin 1 mg tablet Take 1 tablet (1 mg total) by mouth nightly.   metoprolol succinate XL (TOPROL-XL) 25 mg 24 hr tablet Take 0.5 tablets (12.5 mg total) by mouth daily. Take with or immediately following a meal.   OLANZapine (ZYPREXA) 2.5 mg tablet Take 1 tablet (2.5 mg total) by mouth nightly. 12/17/2021: Pharm Tech ([facility]: Per L87 patient administers nightly, however per nurse at facility medication is administer to patient in the morning.  omeprazole (PRILOSEC) 20 mg capsule Take 1 capsule (20 mg total) by mouth 2 times daily (0900, 1700).   polyethylene glycol (MIRALAX) 17 gram packet Take 1 packet (17 g total) by mouth daily. Mix in 8 ounces of fluid   pravastatin (PRAVACHOL) 20 mg tablet Take 1 tablet (20 mg total) by mouth daily with dinner.   propylene glycoL (SYSTANE BALANCE) 0.6 % ophthalmic solution Place 1 drop into both eyes 3 (three) times daily.   tamsulosin (FLOMAX) 0.4 mg 24 hr capsule Take 1 capsule (0.4 mg total) by mouth 2 (two) times daily.   timolol (TIMOPTIC) 0.5 % ophthalmic solution Place 1 drop into both eyes daily.  Britt Bottom, PharmD5:46 PM2/15/2023Phone: Available via Mobile [facility] (MHB)

## 2021-12-17 NOTE — ED Provider Notes
? 2014 CD-Notes ?  [facility] Department	Chief Complaint:Chief Complaint Patient presents with ? Altered Mental Status   BIBA from [facility] for AMS. Aide reports pt is alert and conversant at baseline however this morning pt was found to be less responsive. EMS reports O2 on RA was 90%, pt placed on 4L NC with improvement to 95%.  ZOX:WRUEAV Keith Strickland, 88 y.o., male, presents from nursing home with shortness of breath, low grade fever and low O2. He was found to be 90% on RA by EMS with increased work of breathing.  Review of systems limited by mental status/nonverbal.Historian: patient and spouseReview of Systems: Constitution: 	+low grade fever, Negative for chills, weaknessENT:		Negative for sore throat, nasal congestionEYES:		Negative for vision changesCARDIO:	Negative for chest pain, palpitationsRESP:		+shortness of breath, Negative for coughGI:		Negative for abdominal pain, nausea, vomiting, diarrheaGU:		Negative for dysuria, hematuriaMUSC:		Negative for muscle, joint painSKIN:		Negative for rashNEURO:	Negative for headache, numbness, weaknessPast Medical History:Past Medical History: Diagnosis Date ? Anxiety  ? Closed displaced fracture of left femoral neck (HC Code)  ? GERD (gastroesophageal reflux disease)  ? Glaucoma  ? Hyperlipidemia  ? Hypertension  ? Hypothyroidism  ? IBS (irritable bowel syndrome)  ? Kyphosis  ? Lumbosacral radiculopathy  ? Major depression  ? Osteoarthritis  ? Paroxysmal atrial fibrillation (HC Code) (HC CODE) (HC Code)  ? Pneumonia  ? Vitamin deficiency  Past Surgical History: Procedure Laterality Date ? FRACTURE SURGERY   Social History:Social History Tobacco Use ? Smoking status: Never ? Smokeless tobacco: Never Substance Use Topics ? Alcohol use: No ? Drug use: No Family History:Family History Family history unknown: Yes Medications: Medication List  ASK your doctor about these medications  acetaminophen 325 mg tabletCommonly known as: TYLENOLTake 2 tablets (650 mg total) by mouth every 6 (six) hours as needed (Pain/Fever). azelastine 0.05 % ophthalmic solutionCommonly known as: OPTIVARPlace 1 drop into both eyes daily. cholecalciferol (vitamin D3) 125 mcg (5,000 unit) capsule doxycycline hyclate 100 mg capsuleCommonly known as: VIBRAMYCINTake 1 capsule (100 mg total) by mouth 2 (two) times daily. escitalopram oxalate 10 mg tabletCommonly known as: LEXAPROAsk about: Which instructions should I use? finasteride 5 mg tabletCommonly known as: PROSCARTake 1 tablet (5 mg total) by mouth daily. latanoprost 0.005 % ophthalmic solutionCommonly known as: XALATAN levothyroxine 150 mcg tabletCommonly known as: SYNTHROID, LEVOTHROIDAsk about: Which instructions should I use? magnesium hydroxide 400 mg/5 mL suspensionCommonly known as: MILK OF MAGNESIA melatonin 1 mg tablet metoprolol succinate XL 25 mg 24 hr tabletCommonly known as: TOPROL-XLTake 0.5 tablets (12.5 mg total) by mouth daily. Take with or immediately following a meal. OLANZapine 2.5 mg tabletCommonly known as: ZyPREXATake 1 tablet (2.5 mg total) by mouth nightly. omeprazole 20 mg capsuleCommonly known as: PriLOSECTake 1 capsule (20 mg total) by mouth 2 times daily (0900, 1700). polyethylene glycol 17 gram packetCommonly known as: MIRALAXTake 1 packet (17 g total) by mouth daily. Mix in 8 ounces of fluid pravastatin 20 mg tabletCommonly known as: PRAVACHOLTake 1 tablet (20 mg total) by mouth daily with dinner. Systane Balance 0.6 % ophthalmic solutionGeneric drug: propylene glycoL tamsulosin 0.4 mg 24 hr capsuleCommonly known as: FLOMAX timolol 0.5 % ophthalmic solutionCommonly known as: TIMOPTICPlace 1 drop into both eyes daily. Allergies as of 12/17/2021 - Review Complete 12/17/2021 Allergen Reaction Noted ? Gluten protein Other (See Comments) 10/29/2013 ? Lactose Other (See Comments) 10/29/2013 ? Avelox [moxifloxacin]  02/16/2016 ? Ciprofloxacin  11/10/2018 ? Penicillins  10/28/2013 ? Sulfa (sulfonamide antibiotics)  10/28/2013 Physical Exam:  Vital signs were reviewedBP (!) 146/74  - Pulse (!) 99  -  Temp 97.9 ?F (36.6 ?C) (Axillary)  - Resp 20  - Ht 6' 1 (1.854 m)  - Wt 60 kg  - SpO2 95%  - BMI 17.45 kg/m? General Appear:	Awake, lethargic, and in moderate respiratory distressHead:			Normocephalic, atraumatic. Eyes:			PERRL, EOMI, anictericEars/Nose/Throat:	Pharynx clear, mucous membranes dryNeck:			 no JVDCardiovascular:	Tachycardic rate. Irregular rhythm. No murmurs.Respiratory:		Coarse breath sounds bilaterally, no wheezingAbdomen:		Soft, nontender, nondistended, without rebound, rigidity, or guarding.Musculoskeletal:	No joint tenderness, deformity or swellingSkin:			Dry, no rash, no lacerations or lesionsNeurologic:		Nonfocal neurological exam. Nonverbal. No gross deficits.Medical Decision Making:Nursing notes were reviewed: YesI reviewed the following historical information: medical recordsOxygen saturation interpretation:  Hypoxic on room air at 90%.  Non Hypoxic with SpO2 at 97% on RA Laboratory studies: Results for orders placed or performed during the hospital encounter of 12/17/21 Blood culture-Collect Blood Cx prior to Antibiotics  Specimen: Peripheral; Blood Result Value Ref Range  Blood Culture No Growth to Date  Blood culture-Collect Blood Cx prior to Antibiotics  Specimen: Peripheral; Blood Result Value Ref Range  Blood Culture No Growth to Date  SARS CoV-2 (COVID-19) RNA-[facility] ([facility])  Specimen: Nasopharynx; Viral Result Value Ref Range  SARS-CoV-2 RNA (COVID-19)  Negative Negative Influenza type A/B RNA ([facility])  Specimen: Nasopharynx; Viral Result Value Ref Range  Influenza A Negative Negative  Influenza B Negative Negative Respiratory virus PCR panel     ([facility])  Specimen: Nasopharynx; Viral Result Value Ref Range  Adenovirus Not Detected Not Detected  Human Metapneumovirus (HMPV) Not Detected Not Detected  Parainfluenza Virus 1 Not Detected Not Detected  Parainfluenza Virus 2 Not Detected Not Detected  Parainfluenza Virus 3 Not Detected Not Detected  Parainfluenza Virus 4 Not Detected Not Detected  Respiratory Syncytial Virus A Not Detected Not Detected  Respiratory Syncytial Virus B Not Detected Not Detected  Rhinovirus Not Detected Not Detected Lactic acid, plasma (sepsis, reflex 2h repeat) Result Value Ref Range  Lactate 2.7 (H) 0.4 - 2.0 mmol/L Protime and INR Result Value Ref Range  Prothrombin Time 10.2 9.4 - 12.0 seconds  INR 0.92 0.89 - 1.14 Urinalysis with culture reflex  (obtain straight cath if patient unable to provide sample)  Specimen: Urine, Clean Catch Result Value Ref Range  Clarity, UA Cloudy (A) Clear  Color, UA Yellow Yellow, Colorless  Specific Gravity, UA 1.018 1.005 - 1.030  pH, UA 8.0 (H) 5.5 - 7.5  Protein, UA 2+ (A) Negative, Trace  Glucose, UA Negative Negative  Ketones, UA Negative Negative  Blood, UA 1+ (A) Negative  Bilirubin, UA Negative Negative  Leukocytes, UA 4+ (A) Negative  Nitrite, UA Negative Negative  Urobilinogen, UA <2.0 <=2.0 mg/dL CBC auto differential Result Value Ref Range  WBC 11.9 (H) 4.0 - 11.0 x1000/?L  RBC 4.27 4.00 - 6.00 M/?L  Hemoglobin 13.2 13.2 - 17.1 g/dL  Hematocrit 09.81 19.14 - 50.00 %  MCV 93.0 80.0 - 100.0 fL  MCH 30.9 27.0 - 33.0 pg  MCHC 33.2 31.0 - 36.0 g/dL  RDW-CV 78.2 95.6 - 21.3 %  Platelets 171 150 - 420 x1000/?L  MPV 9.9 8.0 - 12.0 fL  Neutrophils 82.7 (H) 39.0 - 72.0 %  Lymphocytes 9.4 (L) 17.0 - 50.0 %  Monocytes 7.5 4.0 - 12.0 % Eosinophils 0.0 0.0 - 5.0 %  Basophil 0.1 0.0 - 1.4 %  Immature Granulocytes 0.3 0.0 - 1.0 %  nRBC 0.0 0.0 - 1.0 %  ANC(Abs Neutrophil Count) 9.81 (H) 2.00 - 7.60 x 1000/?L  Absolute Lymphocyte Count 1.12 0.60 - 3.70 x  1000/?L  Monocyte Absolute Count 0.89 0.00 - 1.00 x 1000/?L  Eosinophil Absolute Count 0.00 0.00 - 1.00 x 1000/?L  Basophil Absolute Count 0.01 0.00 - 1.00 x 1000/?L  Absolute Immature Granulocyte Count 0.04 0.00 - 0.30 x 1000/?L  Absolute nRBC 0.00 0.00 - 1.00 x 1000/?L Comprehensive metabolic panel Result Value Ref Range  Sodium 148 (H) 136 - 145 mmol/L  Potassium 3.9 3.5 - 5.1 mmol/L  Chloride 115 95 - 115 mmol/L  CO2 25 21 - 32 mmol/L  Anion Gap 8 5 - 18  Glucose 162 (H) 70 - 100 mg/dL  BUN 57 (H) 8 - 25 mg/dL  Creatinine 3.66 (H) 4.40 - 1.30 mg/dL  Calcium 9.3 8.4 - 34.7 mg/dL  BUN/Creatinine Ratio 42.5 (H) 8.0 - 25.0  Total Protein 6.7 6.4 - 8.2 g/dL  Albumin 2.8 (L) 3.4 - 5.0 g/dL  Total Bilirubin 0.7 0.0 - 1.0 mg/dL  Alkaline Phosphatase 57 20 - 135 U/L  Alanine Aminotransferase (ALT) 16 12 - 78 U/L  Aspartate Aminotransferase (AST) 10 5 - 37 U/L  Globulin 3.9 g/dL  A/G Ratio 0.7   AST/ALT Ratio 0.6 See Comment  Osmolality Calculation 314 (H) 275 - 295 mOsm/kg  eGFR (Creatinine) 39 (L) >=60 mL/min/1.68m2 Troponin T High Sensitivity, Emergency; 0 hour baseline AND 1 hour with reflex (3 hour) Result Value Ref Range  High Sensitivity Troponin T 62 (H) See Comment ng/L NT-proBrain natriuretic peptide Result Value Ref Range  NT-proBNP 13,796.0 (H) 0.0 - 1,800.0 pg/mL Procalcitonin     ([facility]) Result Value Ref Range  Procalcitonin 4.27 (H) See Comment ng/mL Blood gas, arterial Result Value Ref Range  Site LEFT RADIAL   pH Arterial 7.42 7.35 - 7.45 units  pCO2, Arterial 39 35 - 45 mmHg  pO2, Arterial 107 (H) 75 - 100 mmHg  Calculated HCO3, Arterial 24.7 20.0 - 26.0 mmol/L  Base Excess, Arterial 1 -2 - 2 mmol/L  Patient Temperature 37.0 Celsius  O2 Sat, Arterial 98 (H) 95 - 97 %  Carboxyhemoglobin, Arterial 1.2 0.5 - 1.5 %  Methemoglobin, Arterial 1.0 0.0 - 1.5 %  Appliance NASAL CANNULA 2 LPM  Troponin T High Sensitivity, 1 Hour With Reflex ([facility]) Result Value Ref Range  High Sensitivity Troponin T 68 (H) See Comment ng/L  1 hour Delta from 0 Hour, HS-Troponin T 6 ng/L Urine microscopic     ([facility]) Result Value Ref Range  RBC/HPF, UA 6 (H) 0 - 2 /HPF  WBC/HPF, UA 606 (H) 0 - 5 /HPF  Bacteria, UA Rare None-Rare /HPF  Budding Yeast, UA Few (A) None /HPF  Urine Squamous Epithelial Cells, UA <1 0 - 5 /HPF  Hyaline Casts, UA 3 0 - 3 /LPF  Ca Oxalate Crys, UA Rare (A) None /HPF Troponin T High Sensitivity, 3 Hour ([facility]) Result Value Ref Range  High Sensitivity Troponin T 57 (H) See Comment ng/L  1 hour Delta from 0 Hour, HS-Troponin T 6 ng/L  3 hour Delta from 0 Hour, HS-Troponin T -5 ng/L Procalcitonin     ([facility]) Result Value Ref Range  Procalcitonin 2.95 (H) See Comment ng/mL TSH w/reflex to FT4 Result Value Ref Range  Thyroid Stimulating Hormone 0.328 (L) 0.358 - 3.740 ?IU/mL Vitamin B12 Result Value Ref Range  Vitamin B12 621 211 - 911 pg/mL T4, free Result Value Ref Range  Free T4 1.55 (H) 0.76 - 1.46 ng/dL Radiological Imaging  Studies:XR Chest PA or AP Final Result Limited AP view- 1. Opacities the left mid and right lower lung zones concerning for pneumonia in the setting of sepsis. 2. Moderate left pleural effusion.  [facility] Radiology Notify System Classification: Orange - Urgent without Follow-up  Reported and Signed by:  Lonia Chimera, MD   CARDIAC EKG RESULT SCAN Final Result  US Renal    (Results Pending) EKG: atrial fibrillationIndependently interpreted by ED provider: labs/EKG/RadDiscussed patient with another provider: YesSummary of ED Vital Signs:Vitals:  12/17/21 1044 12/17/21 1100 12/17/21 1147 12/17/21 1400 BP: 114/80 115/85 (!) 146/74  Pulse: (!) 109 (!) 108 (!) 99  Resp: (!) 42 (!) 48 20  Temp:   97.9 ?F (36.6 ?C)  TempSrc:   Axillary  SpO2: 98% (!) 85% 95%  Weight:    60 kg Height:    6' 1 (1.854 m) ED Course/Assessment/Plan:88 y.o. male sent from nursing home for altered mental status, hypoxia, and low grade fever. O2 improved on NC. He did have one episode of desaturation which improved with suction. CXR with bilateral infiltrates consistent with pneumonia. COVID-19 negative.  Elevated procalcitonin, Likely bacterial, antibiotics given. Discussed with daughter who states pt is DNR/DNI but noninvasive ventilation is okay. He remains stable while in the ED. Though he is normally verbal he remains nonverbal here. Case discussed with hospitalist Dr Joen Laura, pt admitted for further management. Team aware.______________________________________________________________________Condition: 	StableDisposition: 	AdmittedDiagnosis: 	The primary encounter diagnosis was Pneumonia of both lungs due to infectious organism, unspecified part of lung. A diagnosis of Sepsis, due to unspecified organism, unspecified whether acute organ dysfunction present (HC Code) (HC CODE) (HC Code) was also pertinent to this visit.By signing my name below, I, Ella Bodo, attest that this documentation has been prepared under the direction and in the presence of Osborne Casco. MDElectronically Signed: Ella Bodo, scribe. 12/17/2021. 9:15 AM.I, Osborne Casco MD personally performed the services described in this documentation. All medical record entries made by the scribe were at my direction and in my presence. I have reviewed the chart and discharge instructions and agree that the record reflects my personal performance and is accurate and complete. Osborne Casco MD. 12/17/2021. 9:15 AM.?2014 CD-Notes?  LLC All Rights Reserved; version 2.0; revised November, [email or web address] Osborne Casco, MD02/15/23 1517 Osborne Casco, MD02/15/23 [phone]

## 2021-12-17 NOTE — ED Notes
11:03 AM SBAR HandoffSituation:	Admitting Diagnosis: The primary encounter diagnosis was Pneumonia of both lungs due to infectious organism, unspecified part of lung. A diagnosis of Sepsis, due to unspecified organism, unspecified whether acute organ dysfunction present (HC Code) (HC CODE) (HC Code) was also pertinent to this visit.Background:  Chief Complaint Patient presents with ? Altered Mental Status   BIBA from [facility] for AMS. Aide reports pt is alert and conversant at baseline however this morning pt was found to be less responsive. EMS reports O2 on RA was 90%, pt placed on 4L NC with improvement to 95%.  Isolation status: Other: contact, dropletAllergies:Allergies as of 12/17/2021 - Review Complete 12/17/2021 Allergen Reaction Noted ? Gluten protein Other (See Comments) 10/29/2013 ? Lactose Other (See Comments) 10/29/2013 ? Avelox [moxifloxacin]  02/16/2016 ? Ciprofloxacin  11/10/2018 ? Nuts [peanut]  02/16/2016 ? Penicillins  10/28/2013 ? Sulfa (sulfonamide antibiotics)  10/28/2013 Code status: Do Not ResuscitateAssessment:Vital signs:	Vitals:  12/17/21 0909 12/17/21 1044 BP: (!) 159/65 114/80 Pulse: (!) 100 (!) 109 Resp: (!) 40 (!) 42 Temp: 99.8 ?F (37.7 ?C)  TempSrc: Rectal  SpO2: 97% 98% Weight: 59.4 kg  Medications ordered and administered in the Emergency Department:	Medications azithromycin (ZITHROMAX) 500 mg in sodium chloride 0.9% 250 mL IVPB (vialmate) (500 mg Intravenous New Bag 12/17/21 1038) sodium chloride 0.9 % (new bag) bolus 1,000 mL (1,000 mLs Intravenous New Bag 12/17/21 1037) cefTRIAXone (ROCEPHIN) 1 g in sodium chloride 0.9% PF 10 mL (100 mg/mL) (1 g IV Push Given 12/17/21 1038) Labs ordered and resulted in the Emergency Department.	Results for orders placed or performed during the hospital encounter of 12/17/21 Blood culture-Collect Blood Cx prior to Antibiotics Specimen: Peripheral; Blood Result Value Ref Range  Blood Culture No Growth to Date  Blood culture-Collect Blood Cx prior to Antibiotics  Specimen: Peripheral; Blood Result Value Ref Range  Blood Culture No Growth to Date  SARS CoV-2 (COVID-19) RNA-[facility] ([facility])  Specimen: Nasopharynx; Viral Result Value Ref Range  SARS-CoV-2 RNA (COVID-19)  Negative Negative Influenza type A/B RNA ([facility])  Specimen: Nasopharynx; Viral Result Value Ref Range  Influenza A Negative Negative  Influenza B Negative Negative Lactic acid, plasma (sepsis, reflex 2h repeat) Result Value Ref Range  Lactate 2.7 (H) 0.4 - 2.0 mmol/L Protime and INR Result Value Ref Range  Prothrombin Time 10.2 9.4 - 12.0 seconds  INR 0.92 0.89 - 1.14 CBC auto differential Result Value Ref Range  WBC 11.9 (H) 4.0 - 11.0 x1000/?L  RBC 4.27 4.00 - 6.00 M/?L  Hemoglobin 13.2 13.2 - 17.1 g/dL  Hematocrit 84.13 24.40 - 50.00 %  MCV 93.0 80.0 - 100.0 fL  MCH 30.9 27.0 - 33.0 pg  MCHC 33.2 31.0 - 36.0 g/dL  RDW-CV 10.2 72.5 - 36.6 %  Platelets 171 150 - 420 x1000/?L  MPV 9.9 8.0 - 12.0 fL  Neutrophils 82.7 (H) 39.0 - 72.0 %  Lymphocytes 9.4 (L) 17.0 - 50.0 %  Monocytes 7.5 4.0 - 12.0 %  Eosinophils 0.0 0.0 - 5.0 %  Basophil 0.1 0.0 - 1.4 %  Immature Granulocytes 0.3 0.0 - 1.0 %  nRBC 0.0 0.0 - 1.0 %  ANC(Abs Neutrophil Count) 9.81 (H) 2.00 - 7.60 x 1000/?L  Absolute Lymphocyte Count 1.12 0.60 - 3.70 x 1000/?L  Monocyte Absolute Count 0.89 0.00 - 1.00 x 1000/?L  Eosinophil Absolute Count 0.00 0.00 - 1.00 x 1000/?L  Basophil Absolute Count 0.01 0.00 - 1.00 x 1000/?L  Absolute Immature Granulocyte Count 0.04  0.00 - 0.30 x 1000/?L  Absolute nRBC 0.00 0.00 - 1.00 x 1000/?L Comprehensive metabolic panel Result Value Ref Range  Sodium 148 (H) 136 - 145 mmol/L  Potassium 3.9 3.5 - 5.1 mmol/L  Chloride 115 95 - 115 mmol/L  CO2 25 21 - 32 mmol/L Anion Gap 8 5 - 18  Glucose 162 (H) 70 - 100 mg/dL  BUN 57 (H) 8 - 25 mg/dL  Creatinine 1.61 (H) 0.96 - 1.30 mg/dL  Calcium 9.3 8.4 - 04.5 mg/dL  BUN/Creatinine Ratio 40.9 (H) 8.0 - 25.0  Total Protein 6.7 6.4 - 8.2 g/dL  Albumin 2.8 (L) 3.4 - 5.0 g/dL  Total Bilirubin 0.7 0.0 - 1.0 mg/dL  Alkaline Phosphatase 57 20 - 135 U/L  Alanine Aminotransferase (ALT) 16 12 - 78 U/L  Aspartate Aminotransferase (AST) 10 5 - 37 U/L  Globulin 3.9 g/dL  A/G Ratio 0.7   AST/ALT Ratio 0.6 See Comment  Osmolality Calculation 314 (H) 275 - 295 mOsm/kg  eGFR (Creatinine) 39 (L) >=60 mL/min/1.74m2 Troponin T High Sensitivity, Emergency; 0 hour baseline AND 1 hour with reflex (3 hour) Result Value Ref Range  High Sensitivity Troponin T 62 (H) See Comment ng/L Blood gas, arterial Result Value Ref Range  Site LEFT RADIAL   pH Arterial 7.42 7.35 - 7.45 units  pCO2, Arterial 39 35 - 45 mmHg  pO2, Arterial 107 (H) 75 - 100 mmHg  Calculated HCO3, Arterial 24.7 20.0 - 26.0 mmol/L  Base Excess, Arterial 1 -2 - 2 mmol/L  Patient Temperature 37.0 Celsius  O2 Sat, Arterial 98 (H) 95 - 97 %  Carboxyhemoglobin, Arterial 1.2 0.5 - 1.5 %  Methemoglobin, Arterial 1.0 0.0 - 1.5 %  Appliance NASAL CANNULA 2 LPM  Radiology studies done in ER: 	XR Chest PA or AP Final Result Limited AP view- 1. Opacities the left mid and right lower lung zones concerning for pneumonia in the setting of sepsis. 2. Moderate left pleural effusion.  [facility] Radiology Notify System Classification: Orange - Urgent without Follow-up  Reported and Signed by:  Lonia Chimera, MD   IV access:	 forearm right, left, condition patent, no redness and DDI		 forearm right, condition patent, no redness and DDIMental status: AppropriateFunctional Status: Needs Assistance: amb with walkerCurrent pain level: 0Dysphagia screen performed: NoIs patient a fall risk: YesIs patient on oxygen: Yes, O2 2L NCSitter ordered/needed? NoRecommendations:		Outstanding tests: Yes, lactic at noon	Consults: No	Pending labs/meds/blood products: No	Brief plan/summary: Bilat pneumonia, sepsis, admit to tele

## 2021-12-17 NOTE — Plan of Care
Plan of Care Overview/ Patient Status        Assumed care of this 88 years old gentleman with Sepsis/ Pneumonia at 3 pm, who arrived in Telemetry unresponsive.  Turned and repositioned with pillows. He opened his eyes to verbal command and stated he wants to pee. Bladder scanned for 200 cc of urine in the bladder and the patient has a condom catheter- awaiting for patient to urinate. Hr in the 90s to 100, afib with bundle branch block in tele-monitor. Hr decreased to 60s- 70s after receiving Metoprolol 1.25mg  IVP.MRSA swab of the nares sent to lab.

## 2021-12-18 LAB — BASIC METABOLIC PANEL
BKR ANION GAP: 6 (ref 5–18)
BKR BLOOD UREA NITROGEN: 51 mg/dL — ABNORMAL HIGH (ref 8–25)
BKR BUN / CREAT RATIO: 44.7 — ABNORMAL HIGH (ref 8.0–25.0)
BKR CALCIUM: 8.5 mg/dL (ref 8.4–10.3)
BKR CHLORIDE: 118 mmol/L — ABNORMAL HIGH (ref 95–115)
BKR CO2: 24 mmol/L (ref 21–32)
BKR CREATININE: 1.14 mg/dL (ref 0.50–1.30)
BKR EGFR, CREATININE (CKD-EPI 2021): 57 mL/min/{1.73_m2} — ABNORMAL LOW (ref >=60)
BKR GLUCOSE: 114 mg/dL — ABNORMAL HIGH (ref 70–100)
BKR OSMOLALITY CALCULATION: 309 mosm/kg — ABNORMAL HIGH (ref 275–295)
BKR POTASSIUM: 3.8 mmol/L (ref 3.5–5.1)
BKR SODIUM: 148 mmol/L — ABNORMAL HIGH (ref 136–145)

## 2021-12-18 LAB — CBC WITH AUTO DIFFERENTIAL
BKR WAM ABSOLUTE IMMATURE GRANULOCYTES.: 0.04 x 1000/ÂµL (ref 0.00–0.30)
BKR WAM ABSOLUTE LYMPHOCYTE COUNT.: 1.62 x 1000/ÂµL (ref 0.60–3.70)
BKR WAM ABSOLUTE NRBC (2 DEC): 0 x 1000/ÂµL (ref 0.00–1.00)
BKR WAM ANALYZER ANC: 7.53 x 1000/ÂµL (ref 2.00–7.60)
BKR WAM BASOPHIL ABSOLUTE COUNT.: 0.02 x 1000/ÂµL (ref 0.00–1.00)
BKR WAM BASOPHILS: 0.2 % (ref 0.0–1.4)
BKR WAM EOSINOPHIL ABSOLUTE COUNT.: 0 x 1000/ÂµL (ref 0.00–1.00)
BKR WAM EOSINOPHILS: 0 % (ref 0.0–5.0)
BKR WAM HEMATOCRIT (2 DEC): 37.2 % — ABNORMAL LOW (ref 38.50–50.00)
BKR WAM HEMOGLOBIN: 12.3 g/dL — ABNORMAL LOW (ref 13.2–17.1)
BKR WAM IMMATURE GRANULOCYTES: 0.4 % (ref 0.0–1.0)
BKR WAM LYMPHOCYTES: 16.3 % — ABNORMAL LOW (ref 17.0–50.0)
BKR WAM MCH (PG): 30.8 pg (ref 27.0–33.0)
BKR WAM MCHC: 33.1 g/dL (ref 31.0–36.0)
BKR WAM MCV: 93 fL (ref 80.0–100.0)
BKR WAM MONOCYTE ABSOLUTE COUNT.: 0.73 x 1000/ÂµL (ref 0.00–1.00)
BKR WAM MONOCYTES: 7.3 % (ref 4.0–12.0)
BKR WAM MPV: 10.1 fL (ref 8.0–12.0)
BKR WAM NEUTROPHILS: 75.8 % — ABNORMAL HIGH (ref 39.0–72.0)
BKR WAM NUCLEATED RED BLOOD CELLS: 0 % (ref 0.0–1.0)
BKR WAM PLATELETS: 161 x1000/ÂµL (ref 150–420)
BKR WAM RDW-CV: 13.3 % (ref 11.0–15.0)
BKR WAM RED BLOOD CELL COUNT.: 4 M/ÂµL (ref 4.00–6.00)
BKR WAM WHITE BLOOD CELL COUNT: 9.9 x1000/ÂµL (ref 4.0–11.0)

## 2021-12-18 LAB — C-REACTIVE PROTEIN     (CRP)
BKR C-REACTIVE PROTEIN: 18.3 mg/dL — ABNORMAL HIGH (ref 0.0–1.0)
BKR C-REACTIVE PROTEIN: 15.8 mg/dL — ABNORMAL HIGH (ref 0.0–1.0)

## 2021-12-18 LAB — PROCALCITONIN     (BH GH LMW Q YH): BKR PROCALCITONIN: 2.14 ng/mL — ABNORMAL HIGH

## 2021-12-18 MED ORDER — INSULIN LISPRO 100 UNIT/ML (SLIDING SCALE)
100 unit/mL | Freq: Four times a day (QID) | SUBCUTANEOUS | Status: DC
Start: 2021-12-18 — End: 2021-12-23

## 2021-12-18 MED ORDER — METOPROLOL TARTRATE 5 MG/5 ML IV
55 mg/ mL | Freq: Four times a day (QID) | INTRAVENOUS | Status: DC
Start: 2021-12-18 — End: 2021-12-18
  Administered 2021-12-18 (×2): 5 mL via INTRAVENOUS

## 2021-12-18 MED ORDER — METOPROLOL TARTRATE 5 MG/5 ML IV
5 mg/ mL | Freq: Four times a day (QID) | INTRAVENOUS | Status: DC
Start: 2021-12-18 — End: 2021-12-19
  Administered 2021-12-19 (×3): 5 mL via INTRAVENOUS

## 2021-12-18 MED ORDER — SODIUM CHLORIDE 0.9 % (FLUSH) INJECTION SYRINGE
0.9 % | Freq: Three times a day (TID) | INTRAVENOUS | Status: DC
Start: 2021-12-18 — End: 2021-12-23
  Administered 2021-12-19 – 2021-12-22 (×6): 0.9 mL via INTRAVENOUS

## 2021-12-18 MED ORDER — METOPROLOL TARTRATE 5 MG/5 ML IV
5 mg/ mL | Freq: Once | INTRAVENOUS | Status: CP
Start: 2021-12-18 — End: ?
  Administered 2021-12-18: 11:00:00 5 mL via INTRAVENOUS

## 2021-12-18 MED ORDER — SODIUM CHLORIDE 0.9 % (FLUSH) INJECTION SYRINGE
0.9 % | INTRAVENOUS | Status: DC | PRN
Start: 2021-12-18 — End: 2021-12-23
  Administered 2021-12-18 – 2021-12-19 (×2): 0.9 mL via INTRAVENOUS

## 2021-12-18 MED ORDER — DEXTROSE 5 % AND 0.45 % SODIUM CHLORIDE INTRAVENOUS SOLUTION
INTRAVENOUS | Status: DC
Start: 2021-12-18 — End: 2021-12-21
  Administered 2021-12-18 – 2021-12-19 (×3): 1000.000 mL/h via INTRAVENOUS

## 2021-12-18 MED ORDER — AZITHROMYCIN IVPB
INTRAVENOUS | Status: DC
Start: 2021-12-18 — End: 2021-12-19
  Administered 2021-12-18: 18:00:00 250.000 mL/h via INTRAVENOUS

## 2021-12-18 NOTE — Plan of Care
Adult Speech and Language PathologySwallow Evaluation2/16/2023Patient Name:  Keith MargolinMR#:  [id number] Date of Birth:  Sep 24, 1920Therapist:  [place] Kysha Muralles, MS, CCC-SLP SLP IP Adult Date of Visit/Evaluation - 12/18/21 1030    Date of Visit/Evaluation  Date of Visit / Treatment 12/18/21      SLP IP Adult Clinical/Bedside Swallow Evaluation - 12/18/21 1030    Clinical / Bedside Swallow Evaluation  Type of Study Initial   Reason for Study concern for aspiration risk   Patients Current Diet NPO   Patient Position Bed;Upright   Patient Respiratory Status during evaluation - O2 Delivery Nasal Cannula        Baseline Throat Clearing Absent        Baseline Breath/Vocal Quality Normal   Saliva Swallows WFL   Pre-Oral Skills / Observations Adequate   Ice Chips yes        Delivery Fed by examiner        Volume single ice chip        Oral Phase able to strip bolus from spoon        Pharyngeal Phase Overt signs/symptoms of aspiration/difficulty        Overt Signs/Symptoms of Aspiration / Difficulty Cough   Puree yes        Delivery Spoon        Volume 1/4 tsp        Oral Phase Increased oral transit time        Pharyngeal Phase Delayed swallow reflex        Overt Signs / Symptoms of Aspiration / Difficulty Cough;Multiple Swallows per bolus   audible wheeze  Clinical Impression Suspected pharyngeal dysphagia;Suspected oral phase dysphagia   Clinical Impression Based On: Cough;Impaired oral-pharyngeal transport   Patient should be NPO Yes, with strict oral care;Yes, including medications   Follow Up will continue to monitor pt      SLP IP Adult Recommendations - 12/18/21 1030    Recommendations  SLP Therapy Frequency 3x per week      SLP IP Adult Inpatient Recommendations - 12/18/21 1030    SLP Recommendations for Inpatient Admission  Swallow Recommendations Recommend NPO including meds. Strict oral care throughout the day.  D/w pt, private aide, RN & Dr. Tresa Endo via [facility].     Plan of Care Overview/ Patient Status

## 2021-12-18 NOTE — Plan of Care
Problem: Physical Therapy GoalsGoal: Physical Therapy GoalsDescription: PT GOALS1. Patient will perform bed mobility with supervision2. Patient will perform transfers with supervision using appropriate assistive device 3. Patient will ambulate a minimum of 150 feet with supervision using appropriate assistive device 4. Patient will perform HEP with supervisionOutcome: Interventions implemented as appropriate Inpatient Physical Therapy Evaluation HPI/Precautions - 12/18/21 1456    Date of Visit / Treatment  Date of Visit / Treatment 12/18/21   Note Type Evaluation   Progress Report Due 12/18/21   Start Time 1110   End Time 1118    General Information  Pertinent History Of Current Problem Pt is 88 yo M admitted with PNA with PMH: anxiety, GERD, Glaucoma, HLD, Hypothyroidism, PNA, Paroxymal Afib, Dementia   Subjective Pt c/o fatigue   General Observations Pt found supine, alert, pleasant, lethargic and cooperative.   Precautions/Limitations Fall Precautions;Bed alarm;Chair alarm    Weight Bearing Status  Weight Bearing Status WNL - Within normal limits      Social History - 12/18/21 1456    Prior Level of Functioning/Social History  Prior Level of Function independent with assistive device;assist with ADLs   Patient resides with: Facility Resident   Type of Home Facility;Assisted living   Additional Comments Pt lives with ALF has 24/7 aide, ambulates with  RW and needs assist with ADLs      Vitals/Pain - 12/18/21 1456    Vital Signs and Orthostatic Vital Signs  Vital Signs Free text 156/175mmHg, 113bpm    Pain/Comfort  Pain Rating at Rest 0/10 - no pain   Pain Rating with Activity 0/10 - no pain    Patient Coping  Observed Emotional State accepting   Verbalized Emotional State acceptance      Range of Motion - 12/18/21 1456    Range of Motion  Range of Motion Examination bilateral lower extremity ROM was [facility]      MMT/Musculo - 12/18/21 1456    Musculoskeletal  LLE Muscle Strength Grading 2-->active movement with gravity eliminated   RLE Muscle Strength Grading 2-->active movement with gravity eliminated      Balance - 12/18/21 1456    Balance  Balance Skills Training Comment unable to assess due to fatigue      Mobility - 12/18/21 1456    Mobility  Mobility Detailed Documentation Unable to assess;Patient refused      PT Handoff - 12/18/21 1456    Handoff Documentation  Handoff Patient in bed;Bed alarm;Patient instructed to call nursing for mobility;Discussed with nursing      AMPAC Basic Mobility - 12/18/21 1456    PT- AM-PAC - Basic Mobility Screen- How much help from another person do you currently need.....  Turning from your back to your side while in a a flat bed without using rails? 1 - Total - Requires total assistance or cannot do it at all.   Moving from lying on your back to sitting on the side of a flat bed without using bed rails? 1 - Total - Requires total assistance or cannot do it at all.   Moving to and from a bed to a chair (including a wheelchair)? 1 - Total - Requires total assistance or cannot do it at all.   Standing up from a chair using your arms(e.g., wheelchair or bedside chair)? 1 - Total - Requires total assistance or cannot do it at all.   To walk in a hospital room? 1 - Total - Requires total assistance or cannot do it at all.  Climbing 3-5 steps with a railing? 1 - Total - Requires total assistance or cannot do it at all.   AMPAC Mobility Score 6   TARGET Highest Level of Mobility Mobility Level 2, Turn self in bed/bed activity/dependent transfer       Therapeutic Exercise - 12/18/21 1456    Therapeutic Exercise  Therapeutic Exercise Detailed Documentation Lower Extremity Exercises Lower Extremity Exercises  Supine Exercises Heelslides;Hip abduction;Hip adduction;Ankle plantarflexion;Ankle dorsiflexion;Bilateral;AAROM;AROM;10x each      Clinical Impression - 12/18/21 1456    Clinical Impression  Rehab Diagnosis impaired functional mobility   Initial Assessment Pt is 88 yo M admitted with PNA.  He tolerated supine therex fair and limited by fatigue, weakness and decrease activity tolerance.  Pt educated about the importance of OOB mobility but refused to get OOB.  Recommend STR when medically stable.   Criteria for Skilled Therapeutic Interventions Met yes   Pathology/Pathophysiology Noted (Describe Specifically for Each System) musculoskeletal   Impairments Found (describe specific impairments) aerobic capacity/endurance;mobility;gait;balance;muscle strength   Functional Limitations in Following Categories (Describe Specific Limitations) mobility   Disability: Inability to Perform Actions/Activities of Required Roles (describe specific disability) community/leisure   Rehab Potential fair, will monitor progress closely      Patient/Family Stated Goals - 12/18/21 1456    Patient/Family Stated Goals  Patient/Family Stated Goal(s) unable to state      Frequency/Equipment Recommendations - 12/18/21 1456    Frequency/Equipment Recommendations  PT Frequency 3x per week      Recommendations for IP Admission - 12/18/21 1456    PT Recommendations for Inpatient Admission  Activity/Level of Assist out of bed;dangle edge of bed;assist of 2      Planned Treatment/Interventions - 12/18/21 1456    Planned Treatment / Interventions  General Treatment / Interventions Discharge Planning   Training Treatment / Interventions Balance / Gait Training;Functional Mobility Training;Strength Training;Endurance Training   Education Treatment / Interventions Patient Education / Training   PT POC, safety, fall prevention     Discharge Summary - 12/18/21 1456    PT Discharge Summary  Physical Therapy Disposition Recommendation Short Term Rehab     Yates Decamp, PT, DPTPhysical MedicineGreenwich HospitalText at [facility] [phone] Plan of Care Overview/ Patient Status

## 2021-12-18 NOTE — Plan of Care
Plan of Care Overview/ Patient Status    Assumed care of patient at 1900. A+o x1, only oriented to self. Pt lethargic, arouses to voice. Minimally verbally responsive.On 3L NC sating  >96%. Congested cough present, orally suctioned prn.AFib, hr 80-130s, HR and BP elevated this shift, Resident M[place]min contacted, Metop x1 ordered and given per MAR. Denies chest pain, dizziness, palpitations.Incontinent of bowel and bladder. x1 bm this shift. NPO. Fingerstick q6h, no coverage needed. [place] catheter in place with minimal output, MDs aware. Bladder scanned q6h, no straight cath needed. Renal ultrasound completed this shift.Skin intact. Blanchable redness to bottom, barrier cream applied. Turned and repositioned.RN spoke with daughter via telephone, Daughter requested social work consult for pt to return back to [place]. Meds given per MAR. IVF running per [facility]. Bed in lowest position, call bell within reach, safety maintained.

## 2021-12-18 NOTE — Plan of Care
Plan of Care Overview/ Patient Status    Responds to self with voice and touch pt overall lethargic and states he is tired. On 3 L NC. Oral care provided for dry lips and mouth PRN.  Resting in bed. BP (!) 133/105  - Pulse (!) 93  - Temp 99.3 ?F (37.4 ?C) (Axillary)  - Resp (!) 31  - Ht 6' 1 (1.854 m)  - Wt 60 kg  - SpO2 98%  - BMI 17.45 kg/m? . BP and HR monitored closely, Afib on TELE, HR between 70- 130s this am   observed pt to be tachypneic, provider at bedside to assess, meds given per [facility] via IV. IVF updated to D5 1/2 NS @ 75 ml/hr. [place] cath in place. Bladder scan continued. HR 47-82 this afternoon covering provider Roylene Reason updated Bedside SLP completed, pt to remain NPO per recs at this time. Home aide at bedside this shift. 1800- bladder scan 553, pt asymptomatic, no c/o pain or discomfort covering updated, instructed to ctm and straight cath if still retaining tonight. Safety maintained, call bell within reach, rounding provided. Electronically Signed by Trecia Rogers, RN, December 18, 2021

## 2021-12-18 NOTE — Plan of Care
Plan of Care Overview/ Patient Status    Nutrition Initial Assessment:Pt admitted from facility with AMS, Found febrile and hypoxic with lethargy. A&Ox1 per RN. Attempted to visit with pt who was sleeping soundly. Observed to have severe muscle and fat wasting to head/neck, chest, upper extremities. Given this as well as severe wt loss, pt meets criteria for severe chronic malnutrition. SLP eval determined pt not safe for oral diet. If within pt's GOC would place NGT/cortrak tube and initiate EN with Glucerna 1.2 @ 20 ml/hr and advance 20 ml q 12-24 hours (monitoring for refeeding syndrome) to goal rate of 65 ml/hr to provide 1560 ml vol, 1872 kcal, 94 gm protein, 1255 ml free water and 100% RDI's.  PMHx: glaucoma, BPH, HTN, Afib, DM2, dementiaDiet History: UTAWeight history: 99.4 kg (07/2021) = 40% wt loss in 5 monthsFood Allergies: gluten and lactose intoleranceMedications include: zithromax, rocephin, heparin, humalog SSI, lopressor, D5 1/2 NSNutrition Related Labs include: Na 148, Cl 118, BUN 51, glucose 114, BNP 13796, C-RP 18.3Skin: no wounds or edema documented. Bowels/GI: moderate fluffy BM 2/16Pain: UTANutritional AssessmentTimepoint: AssessmentReason For Assessment: per organizational policy  Current StateCurrent Appetite: no oral intakeSpecialty Diet/Nutrition Received: npoNutrition Risk Screen: no indicators presentAnthropometric MeasurementsHeight: 6' 1 (185.4 cm) Weight: 60 kg Weight Method: Actual Weight Scale Used: Bed  BMI (Calculated): 17.5     Nutrition Focused Physical Findings Generalized severe muscle and fat wasting. Estimated Nutritional NeedsTotal Energy Estimated Needs: 1800-2100 kcal (30-35 kcal/kg) Total Protein Estimated Needs: 70-90 gm protein (1.2-1.5 gm/kg)   Nutrition OrderNutrition Order: does not meet nutritional requirementsNutrition RiskDate of Last RD visit: 02/16/23Nutrition Risk: patient at nutritional riskLevel of Risk: highNutrition DiagnosisNutrition Diagnosis/Problem: Severe MalnutritionRelated to: inadequate oral intake and weight lossAs Evidenced by: 40% wt loss in 5 months with severe muscle and fat wasting.Malnutrition Diagnosis - Adult: Severe MalnutritionRecommendations:Follow SLP progress and ability to initiate oral diet vs need for enteral nutrition if within GOC. Oral diet: Advance as per SLP to include no added salt, no added sugar with oral supplements based on diet consistency. Enteral diet: If enteral access obtained, suggest Glucerna 1.2 with goal rate of 65 ml/hr. Additional 150 ml free water q 6 hours provides 1 ml/kcal.Monitor K, phos and Mag for possible refeeding. If levels drop, would replete and provide with thiamine supplementation. Weekly weight. Interventions:Case reviewed. Oral and enteral nutrition recommendations made as appropriate.Malnutrition documented. RD to follow clinical course and adjust nutrition plan as needed. Monitoring / Evaluation:Blood sugar - Goal: fasting/premeal 100-140 mg/dL; Random <180 mg/dLSkin integrity - Goal: intactWeight - Goal: stable during admissionEnergy intake - Goal: >/= 75% estimated needs via nutrition support/po/supplementsRD will continue to follow clinical course per protocol and adjust nutrition plan as appropriateElectronically Signed by Albertina Parr, MS RD CDN CNSC

## 2021-12-18 NOTE — Other
Malnutrition IdentificationPt meets criteria for Severe Malnutrition based on the following identified criteria:Weight Loss:  Chronic Illness: >10%/6 months Body Fat: Chronic Illness: Severe Depletion Muscle Mass:  Chronic Illness: Severe Depletion Registered Dietitian: Albertina Parr, MS RD CNSC

## 2021-12-18 NOTE — Progress Notes
[facility] Medicine Progress NoteAttending Provider: Cindra Presume, * Subjective                                                                              Subjective: Interim History: Overnight with congested voice.Had rapid AFib to 130s, s/p iv lopressor.No fever.This morning, says a few things, says hi back.Did not answer to questions though.Kept eyes closed all the time (normal behavior to him per family).Review of Allergies/Meds/Hx: Review of Allergies/Meds/Hx:I have reviewed the patient's: current scheduled medications Objective Objective: Vitals:Last 24 hours: Temp:  [98 ?F (36.7 ?C)-99.3 ?F (37.4 ?C)] 98.5 ?F (36.9 ?C)Pulse:  [67-135] 82Resp:  [18-34] 20BP: (120-162)/(72-108) 125/72SpO2:  [96 %-98 %] 97 %Physical ExamVitals reviewed. Constitutional:     Appearance: He is not toxic-appearing or diaphoretic.    Comments: Elderly male resting in bed. HENT:    Head: Normocephalic and atraumatic. Eyes:    General: No scleral icterus.Cardiovascular:    Comments: Tachycardic.Pulmonary:    Comments: Breath sounds a bit diminished.Abdominal:    General: Bowel sounds are normal. There is no distension.    Palpations: Abdomen is soft.    Tenderness: There is no abdominal tenderness. Musculoskeletal:    Cervical back: Neck supple.    Right lower leg: No edema.    Left lower leg: No edema. Neurological:    Comments: Eyes closed, but now saying a few words. Psychiatric:    Comments: Cannot assess. Labs:Last 24 hours: Recent Results (from the past 24 hour(s)) POC Glucose (Fingerstick)  Collection Time: 12/17/21  6:43 PM Result Value Ref Range  Glucose, Meter 116 (H) 70 - 100 mg/dL POC Glucose (Fingerstick)  Collection Time: 12/18/21 12:11 AM Result Value Ref Range  Glucose, Meter 110 (H) 70 - 100 mg/dL C-reactive protein  Collection Time: 12/18/21  6:01 AM Result Value Ref Range  C-Reactive Protein 15.8 (H) 0.0 - 1.0 mg/dL Basic metabolic panel  Collection Time: 12/18/21  6:01 AM Result Value Ref Range  Sodium 148 (H) 136 - 145 mmol/L  Potassium 3.8 3.5 - 5.1 mmol/L  Chloride 118 (H) 95 - 115 mmol/L  CO2 24 21 - 32 mmol/L  Anion Gap 6 5 - 18  Glucose 114 (H) 70 - 100 mg/dL  BUN 51 (H) 8 - 25 mg/dL  Creatinine 1.61 0.96 - 1.30 mg/dL  Calcium 8.5 8.4 - 04.5 mg/dL  BUN/Creatinine Ratio 40.9 (H) 8.0 - 25.0  Osmolality Calculation 309 (H) 275 - 295 mOsm/kg  eGFR (Creatinine) 57 (L) >=60 mL/min/1.1m2 CBC auto differential  Collection Time: 12/18/21  6:01 AM Result Value Ref Range  WBC 9.9 4.0 - 11.0 x1000/?L  RBC 4.00 4.00 - 6.00 M/?L  Hemoglobin 12.3 (L) 13.2 - 17.1 g/dL  Hematocrit 81.19 (L) 14.78 - 50.00 %  MCV 93.0 80.0 - 100.0 fL  MCH 30.8 27.0 - 33.0 pg  MCHC 33.1 31.0 - 36.0 g/dL  RDW-CV 29.5 62.1 - 30.8 %  Platelets 161 150 - 420 x1000/?L  MPV 10.1 8.0 - 12.0 fL  Neutrophils 75.8 (H) 39.0 - 72.0 %  Lymphocytes 16.3 (L) 17.0 - 50.0 %  Monocytes 7.3 4.0 - 12.0 %  Eosinophils 0.0 0.0 - 5.0 %  Basophil 0.2 0.0 - 1.4 %  Immature Granulocytes 0.4 0.0 - 1.0 %  nRBC 0.0 0.0 - 1.0 %  ANC(Abs Neutrophil Count) 7.53 2.00 - 7.60 x 1000/?L  Absolute Lymphocyte Count 1.62 0.60 - 3.70 x 1000/?L  Monocyte Absolute Count 0.73 0.00 - 1.00 x 1000/?L  Eosinophil Absolute Count 0.00 0.00 - 1.00 x 1000/?L  Basophil Absolute Count 0.02 0.00 - 1.00 x 1000/?L  Absolute Immature Granulocyte Count 0.04 0.00 - 0.30 x 1000/?L  Absolute nRBC 0.00 0.00 - 1.00 x 1000/?L Procalcitonin     (BH GH LMW Q YH)  Collection Time: 12/18/21  6:01 AM Result Value Ref Range  Procalcitonin 2.14 (H) See Comment ng/mL POC Glucose (Fingerstick)  Collection Time: 12/18/21  6:04 AM Result Value Ref Range  Glucose, Meter 119 (H) 70 - 100 mg/dL EKG  Collection Time: 16/10/96 10:03 AM Result Value Ref Range  Heart Rate 100 bpm  QRS Interval 114 ms  QT Interval 378 ms  QTC Interval 487 ms  P Axis    QRS Axis -57 deg  T Wave Axis 22 deg  P-R Interval    SEVERITY Abnormal ECG severity POC Glucose (Fingerstick)  Collection Time: 12/18/21  2:04 PM Result Value Ref Range  Glucose, Meter 137 (H) 70 - 100 mg/dL Labs reviewedDiagnostics:CXR (12/17/21):1. Opacities the left mid and right lower lung zones concerning for pneumonia in the setting of sepsis. 2. Moderate left pleural effusion. ECG/Tele Events: ECG: AFib 92 bpm, QTc 502 ms, no stemi Assessment Assessment: Assessment:88 yo male, resident at [facility] with PMH of AFib (not on A/C), DM (not on meds), HTN, BPH, anxiety, hypothyroidism, cavernous malformation, small traumatic intraparenchymal hemorrhage in 2019, memory loss, gait abnormality.Presented from home with lethargy, tachypnea, hypoxemia. Seems to have bilateral pneumonia. Question sepsis given lethargy (encephalopathy), AKI (cr 1.5 from normal baseline), tachypnea and tachycardia. Retaining urine in ER, had straight cath? Plan Plan: Acute hypoxemic resp failure, bilateral pneumoniaPossible aspiration considering pt eats any type of food at home- Cont O2 support with NC - on 2-3L/min- Cont abx Ceftiaxone iv and Azithromycin iv/po- Suction prn- NPO, hydrate with fluids- SLP eval when more awake- BCx (2/15) - PEND- Sputum Cx - ordered / MRSA Cx - PEND / [place]-Strep urine Ag - ordered- RVP - neg- Procal - 4.27 - 2.14- WBC - 11.9k - 9.9- CRP: 18 - 15Sepsis?- Abx, iv fluids and cultures as above and below- Likely due to PNA- UA with wbc 600s, LE, some yeast - UCx - GNR (PRELIM)AKI (cr 1.5 from normal baseline), hypernatremia 148Urine retention?- Given 1L of NS in ER; cont D5-1/2NS 20ml/h- Cr - 1.56 - 1.14- Na - 148 - 148- Renal US (2/15) - Thick-walled trabeculated urinary bladder with layering internal debris which may represent urinary tract infection or blood products- Bladder scansAcute encephalopathy; hx of dementia- Treat acute medical issues- If not resolving, may do a [place] scan- HOLDING Lexapro 10mg  hs, Melatonin qhs and Zyprexa 2.5mg  qhs (lethargy)HTN, HLD- Cont Toprol 25mg  and Pravastatin 20mg  qd qd when taking po- Give Metoprolol 5mg  q6h iv schDM type 2 - A1c 5.9; give SSIBPH - Cont Proscar 5mg  qd and Flomax 0.4mg  bid when taking poHypothyroidism- TSH low 0.328 and FT4 high 1.55- Cont L-thyroxine po when taking po; if not taking po in 1 days, give IV (but reduce dose)DVT ppx - Lovenox / HSQCode status: DNRDisposition:- Remain in tele- PT consult Electronically Signed:Kelita Wallis Jordan Likes, MD Beeper 17852/16/2023

## 2021-12-19 ENCOUNTER — Inpatient Hospital Stay: Admit: 2021-12-19 | Payer: MEDICARE

## 2021-12-19 DIAGNOSIS — J189 Pneumonia, unspecified organism: Secondary | ICD-10-CM

## 2021-12-19 LAB — PROCALCITONIN     (BH GH LMW Q YH): BKR PROCALCITONIN: 1.17 ng/mL — ABNORMAL HIGH

## 2021-12-19 LAB — CBC WITH AUTO DIFFERENTIAL
BKR WAM ABSOLUTE IMMATURE GRANULOCYTES.: 0.07 x 1000/ÂµL (ref 0.00–0.30)
BKR WAM ABSOLUTE LYMPHOCYTE COUNT.: 1.55 x 1000/ÂµL (ref 0.60–3.70)
BKR WAM ABSOLUTE NRBC (2 DEC): 0 x 1000/ÂµL (ref 0.00–1.00)
BKR WAM ANALYZER ANC: 6.57 x 1000/ÂµL (ref 2.00–7.60)
BKR WAM BASOPHIL ABSOLUTE COUNT.: 0.01 x 1000/ÂµL (ref 0.00–1.00)
BKR WAM BASOPHILS: 0.1 % (ref 0.0–1.4)
BKR WAM EOSINOPHIL ABSOLUTE COUNT.: 0 x 1000/ÂµL (ref 0.00–1.00)
BKR WAM EOSINOPHILS: 0 % (ref 0.0–5.0)
BKR WAM HEMATOCRIT (2 DEC): 37.2 % — ABNORMAL LOW (ref 38.50–50.00)
BKR WAM HEMOGLOBIN: 11.7 g/dL — ABNORMAL LOW (ref 13.2–17.1)
BKR WAM IMMATURE GRANULOCYTES: 0.8 % (ref 0.0–1.0)
BKR WAM LYMPHOCYTES: 17.3 % (ref 17.0–50.0)
BKR WAM MCH (PG): 30.3 pg (ref 27.0–33.0)
BKR WAM MCHC: 31.5 g/dL (ref 31.0–36.0)
BKR WAM MCV: 96.4 fL (ref 80.0–100.0)
BKR WAM MONOCYTE ABSOLUTE COUNT.: 0.76 x 1000/ÂµL (ref 0.00–1.00)
BKR WAM MONOCYTES: 8.5 % (ref 4.0–12.0)
BKR WAM MPV: 10.2 fL (ref 8.0–12.0)
BKR WAM NEUTROPHILS: 73.3 % — ABNORMAL HIGH (ref 39.0–72.0)
BKR WAM NUCLEATED RED BLOOD CELLS: 0 % (ref 0.0–1.0)
BKR WAM PLATELETS: 174 x1000/ÂµL (ref 150–420)
BKR WAM RDW-CV: 13.1 % (ref 11.0–15.0)
BKR WAM RED BLOOD CELL COUNT.: 3.86 M/ÂµL — ABNORMAL LOW (ref 4.00–6.00)
BKR WAM WHITE BLOOD CELL COUNT: 9 x1000/ÂµL (ref 4.0–11.0)

## 2021-12-19 LAB — BASIC METABOLIC PANEL
BKR ANION GAP: 5 (ref 5–18)
BKR BLOOD UREA NITROGEN: 43 mg/dL — ABNORMAL HIGH (ref 8–25)
BKR BUN / CREAT RATIO: 39.8 — ABNORMAL HIGH (ref 8.0–25.0)
BKR CALCIUM: 8.1 mg/dL — ABNORMAL LOW (ref 8.4–10.3)
BKR CHLORIDE: 119 mmol/L — ABNORMAL HIGH (ref 95–115)
BKR CO2: 23 mmol/L (ref 21–32)
BKR CREATININE: 1.08 mg/dL (ref 0.50–1.30)
BKR EGFR, CREATININE (CKD-EPI 2021): >60 mL/min/{1.73_m2} (ref >=60)
BKR GLUCOSE: 128 mg/dL — ABNORMAL HIGH (ref 70–100)
BKR OSMOLALITY CALCULATION: 305 mosm/kg — ABNORMAL HIGH (ref 275–295)
BKR POTASSIUM: 3.7 mmol/L (ref 3.5–5.1)
BKR SODIUM: 147 mmol/L — ABNORMAL HIGH (ref 136–145)

## 2021-12-19 LAB — URINE CULTURE

## 2021-12-19 LAB — C-REACTIVE PROTEIN     (CRP): BKR C-REACTIVE PROTEIN: 9.5 mg/dL — ABNORMAL HIGH (ref 0.0–1.0)

## 2021-12-19 LAB — ZZZMRSA CULTURE     (BH GH LM YH): BKR MRSA MEDIA: NEGATIVE

## 2021-12-19 MED ORDER — AZITHROMYCIN 250 MG TABLET
250 mg | ORAL | Status: DC
Start: 2021-12-19 — End: 2021-12-21
  Administered 2021-12-19 – 2021-12-21 (×3): 250 mg via ORAL

## 2021-12-19 MED ORDER — FINASTERIDE 5 MG TABLET
5 mg | Freq: Every day | ORAL | Status: DC
Start: 2021-12-19 — End: 2021-12-23
  Administered 2021-12-19 – 2021-12-23 (×5): 5 mg via ORAL

## 2021-12-19 MED ORDER — METOPROLOL SUCCINATE ER (TOPROL) 12.5 MG HALFTAB
12.5 mg | Freq: Every day | ORAL | Status: DC
Start: 2021-12-19 — End: 2021-12-23
  Administered 2021-12-19 – 2021-12-23 (×5): 12.5 mg via ORAL

## 2021-12-19 MED ORDER — TAMSULOSIN 0.4 MG CAPSULE
0.4 mg | Freq: Two times a day (BID) | ORAL | Status: DC
Start: 2021-12-19 — End: 2021-12-23
  Administered 2021-12-19 – 2021-12-23 (×9): 0.4 mg via ORAL

## 2021-12-19 MED ORDER — ZZ IMS TEMPLATE
Freq: Every day | ORAL | Status: DC
Start: 2021-12-19 — End: 2021-12-23
  Administered 2021-12-20 – 2021-12-23 (×4): 25 MCG via ORAL

## 2021-12-19 MED ORDER — ROSUVASTATIN 5 MG TABLET
5 mg | Freq: Every day | ORAL | Status: DC
Start: 2021-12-19 — End: 2021-12-23
  Administered 2021-12-19 – 2021-12-23 (×5): 5 mg via ORAL

## 2021-12-19 MED ORDER — AZITHROMYCIN 250 MG TABLET
250 mg | ORAL | Status: DC
Start: 2021-12-19 — End: 2021-12-19

## 2021-12-19 NOTE — Plan of Care
Adult Speech and Language PathologySwallow Treatment Session2/17/2023Patient Name:  Keith MargolinMR#:  [id number] Date of Birth:  1920/05/31Therapist:  [place] Azyriah Nevins, MS, CCC-SLP SLP IP Adult Date of Visit/Evaluation - 12/19/21 1005    Date of Visit/Evaluation  Date of Visit / Treatment 12/19/21      Dysphagia Treatment - 12/19/21 1005    Dysphagia/Swallow Treatment  Dysphagia Treatment Swallowing   Dysphagia Treatment Comments F/u with pt for possible PO trials.  Pt seen lying in bed, sleeping, but arousable to voice & touch.  He struggled to keep eyes open, but responded to clinician and stated he wanted to drink something.  He was given PO trials of puree and nectar thick liquids via tsp (2oz of each consistency via tsp).   Dysphagia Follow Up Comments Pt tolerated all PO trials without any overt s/s of aspiration.  He would open his mouth in anticipation of additional PO trials.   Dysphagia Recommendations Comments Recommend initiation of pureed diet with nectar thick liquids, aspiration precautions & full 1:1 feed assist.  Further, recommend slow rate, small bites & nectar thick liquids via tsp ONLY.  Meds should be given crushed in puree. D/w pt, RN & Dr. Tresa Endo via [facility].   Medication Administration crushed;with puree   Dysphagia Plan of Care Continue to follow for diet tolerance     ; SLP IP Adult Recommendations - 12/19/21 1005    Recommendations  SLP Therapy Frequency 2x per week      SLP IP Adult Inpatient Recommendations - 12/19/21 1005    SLP Recommendations for Inpatient Admission  Swallow Recommendations Recommend initiation of pureed diet with nectar thick liquids, aspiration precautions & full 1:1 feed assist.  Further, recommend slow rate, small bites & nectar thick liquids via tsp ONLY.  Meds should be given crushed in puree. D/w pt, RN & Dr. Tresa Endo via MHB.     Plan of Care Overview/ Patient Status

## 2021-12-19 NOTE — Progress Notes
[facility]	Progress Note  2Attending Provider: Cindra Presume, *Subjective: 24 hour events:No acute overnight Subjective:The patient was examined at the bedside.  This morning, the patient was asleep.  He was able to open his eyes when asked to, however did not answer questions as to review of systems.  He was comfortable appearing.Spoke with the patient's daughter, [name], over the phone.  We discussed how her father is improving clinically.  Daughter's home full that patient could leave the hospital within the next few days, the sooner the better, given his improvement on antibiotics.Meds: Scheduled Meds:Current Facility-Administered Medications Medication Dose Route Frequency Provider Last Rate Last Admin ? azithromycin (ZITHROMAX) 250 mg in sodium chloride 0.9% 250 mL IVPB  250 mg Intravenous Q24H Roylene Reason [place], DO 250 mL/hr at 12/18/21 1319 250 mg at 12/18/21 1319 ? cefTRIAXone (ROCEPHIN) 1 g in sodium chloride 0.9% PF 10 mL (100 mg/mL)  1 g IV Push Q24H Cindra Presume, MD   1 g at 12/19/21 0840 ? [Held by provider] finasteride (PROSCAR) tablet 5 mg  5 mg Oral Daily Roylene Reason [place], DO     ? heparin (porcine) injection 5,000 Units  5,000 Units Subcutaneous Q8H Roylene Reason [place], DO   5,000 Units at 12/19/21 0559 ? insulin lispro (Admelog, HumaLOG) Sliding Scale (See admin instructions for dose) 1-18 Units  1-18 Units Subcutaneous Q6H Roylene Reason [place], DO     ? ketotifen (ZADITOR) 0.025 % (0.035 %) ophthalmic solution 1 drop  1 drop Both Eyes Daily Roylene Reason [place], DO     ? latanoprost (XALATAN) 0.005 % ophthalmic solution 1 drop  1 drop Both Eyes Nightly Roylene Reason [place], DO   1 drop at 12/18/21 2033 ? levothyroxine (SYNTHROID) IV Push (Adults) - Syringe 75 mcg  75 mcg IV Push Daily Roylene Reason [place], DO   75 mcg at 12/19/21 4098 ? [Held by provider] metoprolol succinate (TOPROL-XL) 24 hr tablet 12.5 mg  12.5 mg Oral Daily Roylene Reason [place], DO     ? metoprolol tartrate (LOPRESSOR) injection 5 mg  5 mg IV Push Q6H Cindra Presume, MD   5 mg at 12/19/21 0841 ? [Held by provider] rosuvastatin (CRESTOR) tablet 5 mg  5 mg Oral Daily Roylene Reason [place], DO     ? sodium chloride 0.9 % flush 3 mL  3 mL IV Push Q8H Cindra Presume, MD   3 mL at 12/19/21 0600 ? [Held by provider] tamsulosin (FLOMAX) 24 hr capsule 0.4 mg  0.4 mg Oral BID Roylene Reason [place], DO     ? timolol (TIMOPTIC) 0.5 % ophthalmic solution 1 drop  1 drop Both Eyes Daily Roylene Reason [place], DO   1 drop at 12/19/21 0841 Continuous Infusions:? D5 1/2 NS 75 mL/hr (12/18/21 2343) PRN Meds:acetaminophen, dextrose (GLUCOSE) oral liquid 15 g **OR** fruit juice **OR** skim milk, dextrose (GLUCOSE) oral liquid 30 g **OR** fruit juice, dextrose injection, dextrose injection, glucagon, polyethylene glycol, sodium chlorideObjective: Vitals:Temp:  [98 ?F (36.7 ?C)-99.2 ?F (37.3 ?C)] 98.6 ?F (37 ?C)Pulse:  [63-129] 75Resp:  [20-34] 24BP: (122-169)/(56-99) 153/84SpO2:  [96 %-99 %] 97 %Device (Oxygen Therapy): nasal cannulaO2 Flow (L/min):  [1-3] 1 on  I/O's:Intake/Output Summary (Last 24 hours) at 12/19/2021 1111Last data filed at 12/19/2021 0600Gross per 24 hour Intake 1036.3 ml Output 1215 ml Net -178.7 ml  Physical Exam:Gen: Drowsy, NADEyes:  EOMI, anicteric scleraENMT: MMM, Sparse dentition. Neck: Supple, no JVDCV: nl S1 and S2,Tachycardic, Irregularly Irregular, no m/r/gPulm: CTAB, no  wheezes, rales or ronchi. Breathing comfortably. Poor Breathing Effort. GI: Soft, NT, ND, +BS. No guarding or rebound tenderness. Ext: No peripheral edema, WWP. Tight muscle noted on left calf. Neuro: A&O x 1, EOM intact, face symmetric, Responsive to name. Psychiatric: Normal mood and affectSkin: No rashesLabs: Recent Labs Lab 02/15/230920 02/16/230601 02/17/230635 WBC 11.9* 9.9 9.0 HGB 13.2 12.3* 11.7* HCT 39.70 37.20* 37.20* PLT 171 161 174  Recent Labs Lab 02/15/230920 02/16/230601 02/17/230635 NEUTROPHILS 82.7* 75.8* 73.3*  Recent Labs Lab 02/15/230920 02/15/231843 02/16/230601 02/16/230604 02/17/230635 NA 148*  --  148*  --  147* K 3.9  --  3.8  --  3.7 CL 115  --  118*  --  119* CO2 25  --  24  --  23 BUN 57*  --  51*  --  43* CREATININE 1.56*  --  1.14  --  1.08 GLU 162*   < > 114*   < > 128* ANIONGAP 8  --  6  --  5  < > = values in this interval not displayed.  Recent Labs Lab 02/15/230920 02/16/230601 02/17/230635 CALCIUM 9.3 8.5 8.1*  Recent Labs Lab 02/15/230920 ALT 16 AST 10 ALKPHOS 57 BILITOT 0.7  Recent Labs Lab 02/15/230920 LABPROT 10.2 INR 0.92  Diagnostics/Radiology:XR Chest PA or APResult Date: 2/15/2023Limited AP view- 1. Opacities the left mid and right lower lung zones concerning for pneumonia in the setting of sepsis. 2. Moderate left pleural effusion. [facility] Radiology Notify System Classification: Orange - Urgent without Follow-up Reported and Signed by:  Lonia Chimera, MD US RenalResult Date: 2/15/2023Normal-appearing kidneys. Thick-walled trabeculated urinary bladder with layering internal debris which may represent urinary tract infection or blood products. Correlate clinically. Small left pleural effusion. Reported and Signed by:  Antonieta Iba, MD Micro:Urine [email or web address] [phone] -----------------------------------------------------------------------------------------------Blood Cx:@[email or web address] ([email or web address])@ -----------------------------------------------------------------------------------------------Sputum Cx@[email or web address] ([email or web address])@[email or web address] [place] See Events: Assessment & Plan: Attending Provider: Cindra Presume, MD [phone]: None89 y.o. male with past medical history notable for HTN, Afib (Not on [facility]), T2DM, Dementia, Glaucoma, Macular Degeneration, and BPH who presents with toxic metabolic encephalopathy likely 2/2 to bacterial pneumonia. The patient will be admitted for antibiotics and further medical management. ??# Toxic Metabolic Encephalopathy# PneumoniaCOVID, Influenza, and Respiratory Viral Panel Negative. LA 2.7, Procal elevated at 4.27. WBC elevated at 11.9. CXR noted opacities the left mid and right lower lung zones concerning for pneumonia. ABG unremarkable. Hx of Dementia may be contributing to altered state. - c/w Ceftriaxone and Azithromycin (day 3), Adjusted Azithromycin to PO today. - f/u Blood Cx: NGTD- f/u Respiratory Cx: Negative- f/u Legionella + Strep Urine: Pending- f/u MRSA: Pending- Suction PRN- Saturating well on room air, titrate as tolerated- Holding medications that could alter mental status- Trend Fever Curve- Adjusted fluids to D5 1/2 NS at 50 cc/hr- based on PT evaluation, patient will require STR at discharge.  Discussed with family, who is open to STR.  Case manager was made aware.?# AKI - Improving# Hypernatremia - ImprovingLikely Pre-renal in setting of sepsis. Cr 1.56 on admission. Baseline Cr 1- Bladder Scan q6H PRN + Straight Cath- Condom Cath in Place, OK for foley catheter if persistent- Monitor I&Os- Trend Renal Function, improving- f/u Renal U/S: Normal kidneys. ?UTI vs Blood in Bladder- Adjusted Fluids to D5 1/2 NS at 50 cc/hr# Asymptomatic Bacteriuria- 50,000 to 99,000 Proteus Mirabilis noted on urine cx. - No Symptoms, though hx of UTI and urinary retention- Covered by current abx for Pneumonia. # Nutrition- Patient able to swallow well today. Now placed on pureed diet with nectar-thickened liquids- Aspiration Precautions- Feeding Assist. ?#  HypothyroidismTSH 0.328, T4 elevated at 1.55- Restarted PTA Levothyroxine 150 mcg?# Hx of Afib # TachycardiaTachycardia likely in setting of sepsis. Patient is NOT on AC- Restarted PTA Toprol XL 12.5 mg QD- Stopped Lopressor 5 mg q6H- f/u echo?# Hx of T2DM- f/u A1c: 5.9%- Low Dose ISS- Not on home medications for diabetes. ?# Chronic Medications- continued home medications                - PRN Tylenol                - Azelastine -> Ordered as Ketotifen 1 drop QD                 - Latanoprost 1 drop nightly                - Timolol 0.5% QD                - Pravastatin 20 mg QD -> ordered as Crestor 5 mg                - Tamsulosin 0.4 mg BID                - Miralax 17g PRN for constipation- held home medications                - Cholecalciferol D3 5000 units QD                - Lexapro 10 mg QD                - Zyprexa 2.5 mg QD- medications to be reconciled??DIET: NPOFLUIDS: D5 1/2 NS at 50 cc/hrELECTROLYTES: Replete PRNPPX: Heparin 5000 TIDACCESS: 2xPIVDISPO: TeleCODE STATUS: DNR/DNIElectronically Signed:Dr. Roylene Reason, DO PGY-2Date: 2/17/2023Time: 11:11 AMPager # [employer] # [phone]

## 2021-12-19 NOTE — Progress Notes
[facility]	Progress Note  1Attending Provider: Cindra Presume, *Subjective: 24 hour events:1 dose of lopressor provided overnight for tachycardiaSubjective:The patient was examined at the bedside.  He continues to be lethargic at times, though is more communicated this morning.  He is easy to rouse to voice.  Patient denied any chest pain, shortness a breath, or nausea/vomiting.Spoke with the patient's daughter and her husband, who is a cardiologist, regarding Cortrak placement if patient were unable to pass SLP tomorrow.  Family wants to hold off until further discussions tomorrow.Meds: Scheduled Meds:Current Facility-Administered Medications Medication Dose Route Frequency Provider Last Rate Last Admin ? azithromycin (ZITHROMAX) 250 mg in sodium chloride 0.9% 250 mL IVPB  250 mg Intravenous Q24H Roylene Reason [place], DO     ? cefTRIAXone (ROCEPHIN) 1 g in sodium chloride 0.9% PF 10 mL (100 mg/mL)  1 g IV Push Q24H Cindra Presume, MD   1 g at 12/18/21 1610 ? [Held by provider] finasteride (PROSCAR) tablet 5 mg  5 mg Oral Daily Roylene Reason , DO     ? heparin (porcine) injection 5,000 Units  5,000 Units Subcutaneous Q8H Roylene Reason [place], DO   5,000 Units at 12/18/21 0541 ? insulin lispro (Admelog, HumaLOG) Sliding Scale (See admin instructions for dose) 1-18 Units  1-18 Units Subcutaneous Q6H Roylene Reason [place], DO     ? ketotifen (ZADITOR) 0.025 % (0.035 %) ophthalmic solution 1 drop  1 drop Both Eyes Daily Roylene Reason [place], DO     ? latanoprost (XALATAN) 0.005 % ophthalmic solution 1 drop  1 drop Both Eyes Nightly Roylene Reason [place], DO   1 drop at 12/17/21 2155 ? levothyroxine (SYNTHROID) IV Push (Adults) - Syringe 75 mcg  75 mcg IV Push Daily Roylene Reason [place], DO   75 mcg at 12/18/21 9604 ? [Held by provider] metoprolol succinate (TOPROL-XL) 24 hr tablet 12.5 mg  12.5 mg Oral Daily Roylene Reason [place], DO ? metoprolol tartrate (LOPRESSOR) injection 2.5 mg  2.5 mg IV Push Q6H Roylene Reason [place], DO   2.5 mg at 12/18/21 5409 ? [Held by provider] rosuvastatin (CRESTOR) tablet 5 mg  5 mg Oral Daily Roylene Reason [place], DO     ? sodium chloride 0.9 % flush 3 mL  3 mL IV Push Q8H Cindra Presume, MD     ? [facility] by provider] tamsulosin (FLOMAX) 24 hr capsule 0.4 mg  0.4 mg Oral BID Roylene Reason [place], DO     ? timolol (TIMOPTIC) 0.5 % ophthalmic solution 1 drop  1 drop Both Eyes Daily Roylene Reason [place], DO     Continuous Infusions:? D5 1/2 NS 75 mL/hr (12/18/21 1004) PRN Meds:acetaminophen, dextrose (GLUCOSE) oral liquid 15 g **OR** fruit juice **OR** skim milk, dextrose (GLUCOSE) oral liquid 30 g **OR** fruit juice, dextrose injection, dextrose injection, glucagon, polyethylene glycol, sodium chlorideObjective: Vitals:Temp:  [97.5 ?F (36.4 ?C)-99.3 ?F (37.4 ?C)] 99.3 ?F (37.4 ?C)Pulse:  [67-135] 93Resp:  [18-34] 31BP: (120-162)/(73-108) 133/105SpO2:  [95 %-100 %] 98 %Device (Oxygen Therapy): nasal cannulaO2 Flow (L/min):  [3] 3 on  I/O's:Intake/Output Summary (Last 24 hours) at 12/18/2021 1121Last data filed at 12/18/2021 0600Gross per 24 hour Intake 1527.08 ml Output 225 ml Net 1302.08 ml  Physical Exam:Gen: Lethargic, NADEyes:  EOMI, anicteric scleraENMT: MMM, Sparse dentition. Neck: Supple, no JVDCV: nl S1 and S2,Tachycardic, Irregularly Irregular, no m/r/gPulm: CTAB, no wheezes, rales or ronchi. Breathing comfortably. Poor Breathing Effort. GI: Soft, Mild Tenderness RLQ, ND, +BS. No guarding or rebound tenderness.  Ext: No peripheral edema, WWP. Tight muscle noted on left calf. Neuro: A&O x 1, EOM intact, face symmetric, Responsive to name. Psychiatric: Normal mood and affectSkin: No rashesLabs: Recent Labs Lab 02/15/230920 02/16/230601 WBC 11.9* 9.9 HGB 13.2 12.3* HCT 39.70 37.20* PLT 171 161  Recent Labs Lab 02/15/230920 02/16/230601 NEUTROPHILS 82.7* 75.8*  Recent Labs Lab 02/15/230920 02/15/231843 02/16/230601 02/16/230604 NA 148*  --  148*  --  K 3.9  --  3.8  --  CL 115  --  118*  --  CO2 25  --  24  --  BUN 57*  --  51*  --  CREATININE 1.56*  --  1.14  --  GLU 162*   < > 114* 119* ANIONGAP 8  --  6  --   < > = values in this interval not displayed.  Recent Labs Lab 02/15/230920 02/16/230601 CALCIUM 9.3 8.5  Recent Labs Lab 02/15/230920 ALT 16 AST 10 ALKPHOS 57 BILITOT 0.7  Recent Labs Lab 02/15/230920 LABPROT 10.2 INR 0.92  Diagnostics/Radiology:XR Chest PA or APResult Date: 2/15/2023Limited AP view- 1. Opacities the left mid and right lower lung zones concerning for pneumonia in the setting of sepsis. 2. Moderate left pleural effusion. [facility] Radiology Notify System Classification: Orange - Urgent without Follow-up Reported and Signed by:  Lonia Chimera, MD US RenalResult Date: 2/15/2023Normal-appearing kidneys. Thick-walled trabeculated urinary bladder with layering internal debris which may represent urinary tract infection or blood products. Correlate clinically. Small left pleural effusion. Reported and Signed by:  Antonieta Iba, MD Micro:Urine [email or web address] [phone] -----------------------------------------------------------------------------------------------Blood Cx:@[email or web address] ([email or web address])@ -----------------------------------------------------------------------------------------------Sputum Cx@[email or web address] ([email or web address])@ECG [place] See Events: Assessment & Plan: Attending Provider: Cindra Presume, MD [phone]: None89 y.o. male with past medical history notable for HTN, Afib (Not on [facility]), T2DM, Dementia, Glaucoma, Macular Degeneration, and BPH who presents with toxic metabolic encephalopathy likely 2/2 to bacterial pneumonia. The patient will be admitted for antibiotics and further medical management. ??# Toxic Metabolic Encephalopathy# PneumoniaCOVID, Influenza, and Respiratory Viral Panel Negative. LA 2.7, Procal elevated at 4.27. WBC elevated at 11.9. CXR noted opacities the left mid and right lower lung zones concerning for pneumonia. ABG unremarkable. Hx of Dementia may be contributing to altered state. - c/w Ceftriaxone and Azithromycin- f/u Blood Cx: NGTD- f/u Respiratory Cx: Negative- f/u Legionella + Strep Urine: Pending- f/u MRSA: Pending- NPO per SLP- Suction PRN- Saturating well on 2L, titrate as tolerated- Holding medications that could alter mental status- Trend Fever Curve- Adjusted fluids to D5 1/2 NS at 75 cc/hr?# AKI# HypernatremiaLikely Pre-renal in setting of sepsis. Cr 1.56 on admission. Baseline Cr 1- Bladder Scan q6H PRN + Straight Cath- Condom Cath in Place, OK for foley catheter if persistent- Monitor I&Os- Trend Renal Function, improving- f/u Renal U/S: Normal kidneys. ?UTI vs Blood in Bladder- Adjusted Fluids to D5 1/2 NS at 75 cc/hr# Asymptomatic Bacteriuria- 50,000 to 99,000 Gram negative rods noted on urine cx. - No Symptoms, though hx of UTI and urinary retention- Will likely be covered by current abx for Pneumonia. # Nutrition- NPO per SLP, will reevaluate tomorrow- Discussed Cortrak with family. Want to hold off on deciding, though concerned with placing an NG tube at this time. ?# HypothyroidismTSH 0.328, T4 elevated at 1.55- Levothyroxine ordered as IV 75 mcg QD while NPO (home dose 150 mcg)?# Hx of Afib # TachycardiaTachycardia likely in setting of sepsis. Patient is NOT on AC- Held PTA Toprol XL 12.5 mg QD given NPO- Lopressor 5 mg q6H while NPO. Adjust dosing as needed- f/u echo?# Hx of T2DM- f/u A1c: 5.9%- Medium Dose  ISS- Not on home medications for diabetes. ?# Chronic Medications- continued home medications - PRN Tylenol                - Azelastine -> Ordered as Ketotifen 1 drop QD                 - Latanoprost 1 drop nightly                - Timolol 0.5% QD                - Pravastatin 20 mg QD -> ordered as Crestor 5 mg                - Tamsulosin 0.4 mg BID                - Miralax 17g PRN for constipation- held home medications                - Cholecalciferol D3 5000 units QD                - Lexapro 10 mg QD                - Zyprexa 2.5 mg QD- medications to be reconciled??DIET: NPOFLUIDS: D5 1/2 NS at 75 cc/hrELECTROLYTES: Replete PRNPPX: Heparin 5000 TIDACCESS: 2xPIVDISPO: TeleCODE STATUS: DNR/DNIElectronically Signed:Dr. Roylene Reason, DO PGY-2Date: 2/16/2023Time: 8:48 PMPager # [employer] # [phone]

## 2021-12-19 NOTE — Progress Notes
[facility] Progress NoteAttending Provider: Cindra Presume, * Subjective                                                                              Subjective: Interim History: Remains drowsy, says a few words, at times opens eyes when asked.No fever.Now down to 1.5L/min O2 and later RA.Requires suctioning at times.Passed SLP eval.Review of Allergies/Meds/Hx: Review of Allergies/Meds/Hx:I have reviewed the patient's: current scheduled medications Objective Objective: Vitals:Last 24 hours: Temp:  [98 ?F (36.7 ?C)-98.6 ?F (37 ?C)] 98.6 ?F (37 ?C)Pulse:  [63-82] 75Resp:  [20-34] 24BP: (122-169)/(56-93) 153/84SpO2:  [95 %-99 %] 95 %Physical ExamVitals reviewed. Constitutional:     Appearance: He is not toxic-appearing or diaphoretic.    Comments: Elderly male resting in bed. HENT:    Head: Normocephalic and atraumatic. Eyes:    General: No scleral icterus.Cardiovascular:    Rate and Rhythm: Normal rate. Pulmonary:    Comments: Breath sounds a bit diminished.Abdominal:    General: Bowel sounds are normal. There is no distension.    Palpations: Abdomen is soft.    Tenderness: There is no abdominal tenderness. Musculoskeletal:    Cervical back: Neck supple.    Right lower leg: No edema.    Left lower leg: No edema. Neurological:    Comments: Eyes closed, but says a few words. Psychiatric:    Comments: Cannot assess. Labs:Last 24 hours: Recent Results (from the past 24 hour(s)) POC Glucose (Fingerstick)  Collection Time: 12/18/21  6:14 PM Result Value Ref Range  Glucose, Meter 136 (H) 70 - 100 mg/dL POC Glucose (Fingerstick)  Collection Time: 12/19/21 12:01 AM Result Value Ref Range  Glucose, Meter 130 (H) 70 - 100 mg/dL POC Glucose (Fingerstick)  Collection Time: 12/19/21  5:53 AM Result Value Ref Range  Glucose, Meter 124 (H) 70 - 100 mg/dL C-reactive protein  Collection Time: 12/19/21  6:35 AM Result Value Ref Range  C-Reactive Protein 9.5 (H) 0.0 - 1.0 mg/dL Procalcitonin     (BH GH LMW Q YH)  Collection Time: 12/19/21  6:35 AM Result Value Ref Range  Procalcitonin 1.17 (H) See Comment ng/mL Basic metabolic panel  Collection Time: 12/19/21  6:35 AM Result Value Ref Range  Sodium 147 (H) 136 - 145 mmol/L  Potassium 3.7 3.5 - 5.1 mmol/L  Chloride 119 (H) 95 - 115 mmol/L  CO2 23 21 - 32 mmol/L  Anion Gap 5 5 - 18  Glucose 128 (H) 70 - 100 mg/dL  BUN 43 (H) 8 - 25 mg/dL  Creatinine 1.61 0.96 - 1.30 mg/dL  Calcium 8.1 (L) 8.4 - 10.3 mg/dL  BUN/Creatinine Ratio 04.5 (H) 8.0 - 25.0  Osmolality Calculation 305 (H) 275 - 295 mOsm/kg  eGFR (Creatinine) >60 >=60 mL/min/1.57m2 CBC auto differential  Collection Time: 12/19/21  6:35 AM Result Value Ref Range  WBC 9.0 4.0 - 11.0 x1000/?L  RBC 3.86 (L) 4.00 - 6.00 M/?L  Hemoglobin 11.7 (L) 13.2 - 17.1 g/dL  Hematocrit 40.98 (L) 11.91 - 50.00 %  MCV 96.4 80.0 - 100.0 fL  MCH 30.3 27.0 - 33.0 pg  MCHC 31.5 31.0 - 36.0 g/dL  RDW-CV 47.8 29.5 - 62.1 %  Platelets 174 150 - 420  x1000/?L  MPV 10.2 8.0 - 12.0 fL  Neutrophils 73.3 (H) 39.0 - 72.0 %  Lymphocytes 17.3 17.0 - 50.0 %  Monocytes 8.5 4.0 - 12.0 %  Eosinophils 0.0 0.0 - 5.0 %  Basophil 0.1 0.0 - 1.4 %  Immature Granulocytes 0.8 0.0 - 1.0 %  nRBC 0.0 0.0 - 1.0 %  ANC(Abs Neutrophil Count) 6.57 2.00 - 7.60 x 1000/?L  Absolute Lymphocyte Count 1.55 0.60 - 3.70 x 1000/?L  Monocyte Absolute Count 0.76 0.00 - 1.00 x 1000/?L  Eosinophil Absolute Count 0.00 0.00 - 1.00 x 1000/?L  Basophil Absolute Count 0.01 0.00 - 1.00 x 1000/?L  Absolute Immature Granulocyte Count 0.07 0.00 - 0.30 x 1000/?L  Absolute nRBC 0.00 0.00 - 1.00 x 1000/?L POC Glucose (Fingerstick)  Collection Time: 12/19/21 11:28 AM Result Value Ref Range  Glucose, Meter 170 (H) 70 - 100 mg/dL Labs reviewedDiagnostics:CXR (12/17/21):1. Opacities the left mid and right lower lung zones concerning for pneumonia in the setting of sepsis. 2. Moderate left pleural effusion. ECG/Tele Events: ECG: AFib 92 bpm, QTc 502 ms, no stemi Assessment Assessment: Assessment:88 yo male, resident at [facility], with PMH of AFib (not on A/C), DM (not on meds), HTN, BPH, anxiety, hypothyroidism, cavernous malformation, small traumatic intraparenchymal hemorrhage in 2019, memory loss, gait abnormality.Presented from home with lethargy, tachypnea, hypoxemia. Seems to have bilateral pneumonia. Question sepsis given lethargy (encephalopathy), AKI (cr 1.5 from normal baseline), tachypnea and tachycardia. Retaining urine in ER, had straight cath? Plan Plan: Acute hypoxemic resp failure, bilateral pneumoniaPossible aspiration considering pt eats any type of food at home- Cont O2 support with NC - now on RA / 1.5L- Cont abx Ceftiaxone iv and Azithromycin iv/po (day 3 - dose at 9am)- Suction prn- Passed SLP eval on 2/17 for dysphagia diet- BCx (2/15) - PEND- Sputum Cx - ordered / [place]-Strep urine Ag - ordered- RVP - neg- MRSA Cx - neg - Procal - 4.27 - 2.14 - 1.17- WBC - 11.9k - 9.9 - 9.0- CRP: 18 - 15 - 9Sepsis?- Abx, iv fluids and cultures as above and below- Likely due to PNA, but UCx also with Proteus- UA with wbc 600s, LE, some yeast - UCx - Proteus 50-99k cfu (pan-sens)AKI (cr 1.5 from normal baseline), hypernatremia 148Urine retention?- Given 1L of NS in ER; cont D5-1/2NS down to 31ml/h- Cr - 1.56 - 1.14- 1.08- Na - 148 - 148 - 147- Renal US (2/15) - Thick-walled trabeculated urinary bladder with layering internal debris which may represent urinary tract infection or blood products- Bladder scansAcute encephalopathy; hx of dementia- Treat acute medical issues- If not resolving, may do a [place] scan- HOLDING Lexapro 10mg  hs, Melatonin qhs and Zyprexa 2.5mg  qhs (lethargy)HTN, HLD- Cont Toprol 25mg  and Pravastatin 20mg  qd qd- Given Metoprolol 5mg  q6h iv sch - stop, now taking po- Checking ECHO (2/17) - PENDINGDM type 2 - A1c 5.9; give SSIBPH - Cont Proscar 5mg  qd and Flomax 0.4mg  bidHypothyroidism- TSH low 0.328 and FT4 high 1.55- Cont L-thyroxine qd po - may repeat TFTs as outpt when not in acute illnessFEN- SLP (2/17) - Pureed and nectars, aspiration precautions, assist with feedingDVT ppx - HSQCode status: DNRDisposition:- May transfer to floors with RTM for 24h- PT consult (2/16) - STR- Discuss with CM Electronically Signed:Zacharias Ridling Jordan Likes, MD [place] 17852/17/2023

## 2021-12-19 NOTE — Plan of Care
Plan of Care Overview/ Patient Status    SOCIAL WORK NOTEPatient Name: Keith Strickland Record Number: [id number] Date of Birth: 07/14/1920Medical Social Work Follow Up  [employer] Most Recent Value Admission Information  Document Type Progress Note (For Inpatient/ED Only) Prior psychosocial assessment has been documented within this hospitalization No (For Inpatient/ED Only) Prior psychosocial assessment has been documented within 30 days of this hospitalization No Reason for Current Social Work Involvement Family Needs/Concerns Source of Information Family/Caregiver Record Reviewed Yes Level of Care Inpatient Psychosocial issues requiring intervention In person screening Psychosocial interventions 5 minutes spent via telephone with daughter and Health Care Proxy [name] as she requested to speak with Social Work on desired discharge disposition. I provided empathetic listening and validated her concerns. Case Management informed of want for short term rehabilitation. Information provided by daughter yielded negative social work screening results. [facility] Medical team Specific referrals to enhance community supports (include existing and new resources) None at this time. Handoff Required? No Next Steps/Plan (including hand-off): No social work interventions at this time. Please consult if any needs rise. Signature: Jolyn Lent LCSW Contact Information: [phone]  SW Admission Screening  Flowsheet Row Most Recent Value SW Admission Screening  Incomplete emergency contact or next of kin on record No Patient identity unknown No Uninsured/under-insured  No Group home or residential placement No Guardianship or conservatorship No Readmission within 30 days No International residency or residence outside of [place] No Currently on Dialysis No Active Interpersonal Violence/Sexual Assault No Concern for current abuse or neglect No Concern for current homelessness No Current behavioral health concerns No Concern for active substance use No Screening Result Negative: No current psychosocial barriers to discharge identified at this time.  Jolyn Lent, LCSWLead Social WorkerGreenwich [facility] # [phone] Phone # [phone]

## 2021-12-19 NOTE — Plan of Care
Plan of Care Overview/ Patient Status    Received  lethargic, open eyes when asked. Tachypneic with RR 30-36/min. No verbalization noted. Suctioned orally for brown thick secretions. Mouth care done. Incontinent of urine and small amts of brown loose stools. Sbp 120's to 140's. Repositioned to sides. Bed alarm on.0630=incontinent of small loose stools and urine. Condom cath changed, leaking. Mouth care done. Suctioned orally. HR dipped down  to 38-40's after dose of lopressor given.Daughter stated father not really opening eyes, can't see not related to responsiveness.

## 2021-12-20 LAB — CBC WITH AUTO DIFFERENTIAL
BKR WAM ABSOLUTE IMMATURE GRANULOCYTES.: 0.13 x 1000/ÂµL (ref 0.00–0.30)
BKR WAM ABSOLUTE LYMPHOCYTE COUNT.: 1.68 x 1000/ÂµL (ref 0.60–3.70)
BKR WAM ABSOLUTE NRBC (2 DEC): 0 x 1000/ÂµL (ref 0.00–1.00)
BKR WAM ANALYZER ANC: 6.34 x 1000/ÂµL (ref 2.00–7.60)
BKR WAM BASOPHIL ABSOLUTE COUNT.: 0.02 x 1000/ÂµL (ref 0.00–1.00)
BKR WAM BASOPHILS: 0.2 % (ref 0.0–1.4)
BKR WAM EOSINOPHIL ABSOLUTE COUNT.: 0.01 x 1000/ÂµL (ref 0.00–1.00)
BKR WAM EOSINOPHILS: 0.1 % (ref 0.0–5.0)
BKR WAM HEMATOCRIT (2 DEC): 38 % — ABNORMAL LOW (ref 38.50–50.00)
BKR WAM HEMOGLOBIN: 11.7 g/dL — ABNORMAL LOW (ref 13.2–17.1)
BKR WAM IMMATURE GRANULOCYTES: 1.4 % — ABNORMAL HIGH (ref 0.0–1.0)
BKR WAM LYMPHOCYTES: 18.7 % (ref 17.0–50.0)
BKR WAM MCH (PG): 29.7 pg (ref 27.0–33.0)
BKR WAM MCHC: 30.8 g/dL — ABNORMAL LOW (ref 31.0–36.0)
BKR WAM MCV: 96.4 fL (ref 80.0–100.0)
BKR WAM MONOCYTE ABSOLUTE COUNT.: 0.79 x 1000/ÂµL (ref 0.00–1.00)
BKR WAM MONOCYTES: 8.8 % (ref 4.0–12.0)
BKR WAM MPV: 10.2 fL (ref 8.0–12.0)
BKR WAM NEUTROPHILS: 70.8 % (ref 39.0–72.0)
BKR WAM NUCLEATED RED BLOOD CELLS: 0 % (ref 0.0–1.0)
BKR WAM PLATELETS: 176 x1000/ÂµL (ref 150–420)
BKR WAM RDW-CV: 12.9 % (ref 11.0–15.0)
BKR WAM RED BLOOD CELL COUNT.: 3.94 M/ÂµL — ABNORMAL LOW (ref 4.00–6.00)
BKR WAM WHITE BLOOD CELL COUNT: 9 x1000/ÂµL (ref 4.0–11.0)

## 2021-12-20 LAB — BASIC METABOLIC PANEL
BKR ANION GAP: 4 — ABNORMAL LOW (ref 5–18)
BKR BLOOD UREA NITROGEN: 36 mg/dL — ABNORMAL HIGH (ref 8–25)
BKR BUN / CREAT RATIO: 28.8 — ABNORMAL HIGH (ref 8.0–25.0)
BKR CALCIUM: 7.8 mg/dL — ABNORMAL LOW (ref 8.4–10.3)
BKR CHLORIDE: 117 mmol/L — ABNORMAL HIGH (ref 95–115)
BKR CO2: 25 mmol/L (ref 21–32)
BKR CREATININE: 1.25 mg/dL (ref 0.50–1.30)
BKR EGFR, CREATININE (CKD-EPI 2021): 51 mL/min/{1.73_m2} — ABNORMAL LOW (ref >=60)
BKR GLUCOSE: 449 mg/dL — ABNORMAL HIGH (ref 70–100)
BKR OSMOLALITY CALCULATION: 318 mosm/kg — ABNORMAL HIGH (ref 275–295)
BKR POTASSIUM: 3.7 mmol/L (ref 3.5–5.1)
BKR SODIUM: 146 mmol/L — ABNORMAL HIGH (ref 136–145)

## 2021-12-20 NOTE — Progress Notes
[facility] Progress NoteAttending Provider: Cindra Presume, * Subjective                                                                              Subjective: Interim History: Seen this morning, appears more responsive.Opens eyes to command, says a few words.No fevers. At times on RA.Review of Allergies/Meds/Hx: Review of Allergies/Meds/Hx:I have reviewed the patient's: current scheduled medications Objective Objective: Vitals:Last 24 hours: Temp:  [97.4 ?F (36.3 ?C)-97.9 ?F (36.6 ?C)] 97.8 ?F (36.6 ?C)Pulse:  [66-100] 68Resp:  [18-29] 18BP: (130-169)/(63-96) 162/63SpO2:  [94 %-96 %] 96 %Physical ExamVitals reviewed. Constitutional:     Appearance: He is not toxic-appearing or diaphoretic.    Comments: Elderly male resting in bed. HENT:    Head: Normocephalic and atraumatic. Eyes:    General: No scleral icterus.Cardiovascular:    Rate and Rhythm: Normal rate. Pulmonary:    Comments: Breath sounds a bit diminished.Abdominal:    General: Bowel sounds are normal. There is no distension.    Palpations: Abdomen is soft.    Tenderness: There is no abdominal tenderness. Musculoskeletal:    Cervical back: Neck supple.    Right lower leg: No edema.    Left lower leg: No edema. Neurological:    Comments: Eyes closed, open slightly on command, but says a few words. Psychiatric:    Comments: Cannot assess. Labs:Last 24 hours: Recent Results (from the past 24 hour(s)) POC Glucose (Fingerstick)  Collection Time: 12/19/21  6:00 PM Result Value Ref Range  Glucose, Meter 121 (H) 70 - 100 mg/dL POC Glucose (Fingerstick)  Collection Time: 12/19/21 11:34 PM Result Value Ref Range  Glucose, Meter 131 (H) 70 - 100 mg/dL Basic metabolic panel  Collection Time: 12/20/21  5:08 AM Result Value Ref Range  Sodium 146 (H) 136 - 145 mmol/L  Potassium 3.7 3.5 - 5.1 mmol/L Chloride 117 (H) 95 - 115 mmol/L  CO2 25 21 - 32 mmol/L  Anion Gap 4 (L) 5 - 18  Glucose 449 (H) 70 - 100 mg/dL  BUN 36 (H) 8 - 25 mg/dL  Creatinine 1.61 0.96 - 1.30 mg/dL  Calcium 7.8 (L) 8.4 - 10.3 mg/dL  BUN/Creatinine Ratio 04.5 (H) 8.0 - 25.0  Osmolality Calculation 318 (H) 275 - 295 mOsm/kg  eGFR (Creatinine) 51 (L) >=60 mL/min/1.11m2 CBC auto differential  Collection Time: 12/20/21  5:08 AM Result Value Ref Range  WBC 9.0 4.0 - 11.0 x1000/?L  RBC 3.94 (L) 4.00 - 6.00 M/?L  Hemoglobin 11.7 (L) 13.2 - 17.1 g/dL  Hematocrit 40.98 (L) 11.91 - 50.00 %  MCV 96.4 80.0 - 100.0 fL  MCH 29.7 27.0 - 33.0 pg  MCHC 30.8 (L) 31.0 - 36.0 g/dL  RDW-CV 47.8 29.5 - 62.1 %  Platelets 176 150 - 420 x1000/?L  MPV 10.2 8.0 - 12.0 fL  Neutrophils 70.8 39.0 - 72.0 %  Lymphocytes 18.7 17.0 - 50.0 %  Monocytes 8.8 4.0 - 12.0 %  Eosinophils 0.1 0.0 - 5.0 %  Basophil 0.2 0.0 - 1.4 %  Immature Granulocytes 1.4 (H) 0.0 - 1.0 %  nRBC 0.0 0.0 - 1.0 %  ANC(Abs Neutrophil Count) 6.34 2.00 - 7.60 x 1000/?L  Absolute Lymphocyte Count 1.68  0.60 - 3.70 x 1000/?L  Monocyte Absolute Count 0.79 0.00 - 1.00 x 1000/?L  Eosinophil Absolute Count 0.01 0.00 - 1.00 x 1000/?L  Basophil Absolute Count 0.02 0.00 - 1.00 x 1000/?L  Absolute Immature Granulocyte Count 0.13 0.00 - 0.30 x 1000/?L  Absolute nRBC 0.00 0.00 - 1.00 x 1000/?L POC Glucose (Fingerstick)  Collection Time: 12/20/21  6:08 AM Result Value Ref Range  Glucose, Meter 125 (H) 70 - 100 mg/dL POC Glucose (Fingerstick)  Collection Time: 12/20/21 11:51 AM Result Value Ref Range  Glucose, Meter 130 (H) 70 - 100 mg/dL Labs reviewedDiagnostics:CXR (12/17/21):1. Opacities the left mid and right lower lung zones concerning for pneumonia in the setting of sepsis. 2. Moderate left pleural effusion. ECG/Tele Events: ECG: AFib 92 bpm, QTc 502 ms, no stemi Assessment Assessment: Assessment:88 yo male, resident at [facility], with PMH of AFib (not on A/C), DM (not on meds), HTN, BPH, anxiety, hypothyroidism, cavernous malformation, small traumatic intraparenchymal hemorrhage in 2019, memory loss, gait abnormality.Presented from home with lethargy, tachypnea, hypoxemia. Seems to have bilateral pneumonia. Question sepsis given lethargy (encephalopathy), AKI (cr 1.5 from normal baseline), tachypnea and tachycardia. Retaining urine in ER, had straight cath? Plan Plan: Acute hypoxemic resp failure, bilateral pneumoniaPossible aspiration considering pt eats any type of food at home- Cont O2 support with NC - now on RA / 1.5L- Cont abx Ceftiaxone iv and Azithromycin iv/po (day 4 - dose at 9am)- Suction prn- Passed SLP eval on 2/17 for dysphagia diet- BCx (2/15) - PEND- Sputum Cx - ordered / [place]-Strep urine Ag - ordered- RVP - neg- MRSA Cx - neg - Procal - 4.27 - 2.14 - 1.17- WBC - 11.9k - 9.9 - 9.0 - 9.0- CRP: 18 - 15 - 9Sepsis?- Abx, iv fluids and cultures as above and below- Likely due to PNA, but UCx also with Proteus- UA with wbc 600s, LE, some yeast - UCx - Proteus 50-99k cfu (pan-sens)AKI (cr 1.5 from normal baseline), hypernatremia 148Urine retention?- Given 1L of NS in ER; cont D5-1/2NS down to 69ml/h- Cr - 1.56 - 1.14- 1.08 - 1.25- Na - 148 - 148 - 147 - 146- Renal US (2/15) - Thick-walled trabeculated urinary bladder with layering internal debris which may represent urinary tract infection or blood products- Bladder scansAcute encephalopathy; hx of dementia- Treat acute medical issues- If not resolving, may do a [place] scan- HOLDING Lexapro 10mg  hs, Melatonin qhs and Zyprexa 2.5mg  qhs (lethargy)HTN, HLD- Cont Toprol 25mg  and Pravastatin 20mg  qd qd- Given Metoprolol 5mg  q6h iv sch - stop, now taking po- ECHO (2/17) - EF 55-60%, normal RV sizue and fuction, RVSP severely elevated, mild-mod MRDM type 2 - A1c 5.9; give SSIBPH - Cont Proscar 5mg  qd and Flomax 0.4mg  bidHypothyroidism- TSH low 0.328 and FT4 high 1.55- Cont L-thyroxine qd po - may repeat TFTs as outpt when not in acute illnessFEN- SLP (2/17) - Pureed and nectars, aspiration precautions, assist with feedingDVT ppx - HSQCode status: DNRDisposition:- PT consult (2/16) - STR- Possible dc early next week to [facility] if arrangements made and pt is stable Electronically Signed:Tomoki Lucken Jordan Likes, MD Beeper 17852/18/2023

## 2021-12-20 NOTE — Plan of Care
Plan of Care Overview/ Patient Status    Pt responds to voice and touch, lethargic otherwise, keeps eyes closed. Weaned off o2, sating 95% on RA. Resting in bed, assist x2 while in bed. BP (!) 153/84  - Pulse 75  - Temp 98.6 ?F (37 ?C) (Axillary)  - Resp (!) 24  - Ht 6' 1 (1.854 m)  - Wt 60 kg  - SpO2 97%  - BMI 17.45 kg/m? Marland Kitchen HR afib HR 70s-80s. [place] cath continued, bladder scans maintained. BS q 6 hours. IVF continued. Diet upgraded to Pureed and nectar thick liquids, per SLP. Aspiration precautions, suction, and 1:1 feed in place. PO mouth care provided, PRN.  Meds taken PO crushed with applesauce. Safety maintained, call bell within reach, rounding provided. Electronically Signed by Trecia Rogers, RN, December 19, 2021

## 2021-12-20 NOTE — Plan of Care
Plan of Care Overview/ Patient Status    Assumed care of patient at 0700. A&Ox1. Afib. Opens eyes to voice, lethargic. Lungs diminished bilaterally; room air. External catheter in place; draining amber urine.1430: 564cc bladder scan1600: >500cc bladder scan still showing/straight cath performed. 250cc urine obtained. Residual bladder scan 302cc

## 2021-12-20 NOTE — Plan of Care
Plan of Care Overview/ Patient Status    Responding verbally. Following simple commands. On RA sat 93-95%. Condom cath in place. No stools noted. Skin tear on mid-back noted,  foam applied. Repositioned to sides. Bed alarm on.

## 2021-12-20 NOTE — Progress Notes
[facility]	Progress Note  3Attending Provider: Cindra Presume, *Subjective: 24 hour events:No acute overnight Subjective:The patient was examined at the bedside.  This morning, the patient was eating breakfast.  Appeared comfortable and responded to questions however did not open his eyes.  Patient denies any acute complaints this morning.  Aid at bedside reported patient appeared at his baseline relative to prior day's working with him. Meds: Scheduled Meds:Current Facility-Administered Medications Medication Dose Route Frequency Provider Last Rate Last Admin ? azithromycin (ZITHROMAX) tablet 250 mg  250 mg Oral Q24H Roylene Reason [place], DO   250 mg at 12/19/21 1343 ? cefTRIAXone (ROCEPHIN) 1 g in sodium chloride 0.9% PF 10 mL (100 mg/mL)  1 g IV Push Q24H Cindra Presume, MD   1 g at 12/20/21 0859 ? finasteride (PROSCAR) tablet 5 mg  5 mg Oral Daily Roylene Reason [place], DO   5 mg at 12/20/21 1610 ? heparin (porcine) injection 5,000 Units  5,000 Units Subcutaneous Q8H Roylene Reason [place], DO   5,000 Units at 12/20/21 0530 ? insulin lispro (Admelog, HumaLOG) Sliding Scale (See admin instructions for dose) 1-18 Units  1-18 Units Subcutaneous Q6H Roylene Reason [place], DO     ? ketotifen (ZADITOR) 0.025 % (0.035 %) ophthalmic solution 1 drop  1 drop Both Eyes Daily Roylene Reason [place], DO   1 drop at 12/19/21 1343 ? latanoprost (XALATAN) 0.005 % ophthalmic solution 1 drop  1 drop Both Eyes Nightly Roylene Reason [place], DO   1 drop at 12/19/21 2038 ? levothyroxine (SYNTHROID, LEVOTHROID) tablet 150 mcg  150 mcg Oral Daily (0600) Roylene Reason [place], DO   150 mcg at 12/20/21 0530 ? metoprolol succinate (TOPROL-XL) 24 hr tablet 12.5 mg  12.5 mg Oral Daily Roylene Reason [place], DO   12.5 mg at 12/20/21 9604 ? rosuvastatin (CRESTOR) tablet 5 mg  5 mg Oral Daily Roylene Reason [place], DO   5 mg at 12/20/21 5409 ? sodium chloride 0.9 % flush 3 mL  3 mL IV Push Q8H Cindra Presume, MD   3 mL at 12/20/21 0535 ? tamsulosin (FLOMAX) 24 hr capsule 0.4 mg  0.4 mg Oral BID Roylene Reason [place], DO   0.4 mg at 12/20/21 8119 ? timolol (TIMOPTIC) 0.5 % ophthalmic solution 1 drop  1 drop Both Eyes Daily Roylene Reason [place], DO   1 drop at 12/20/21 0859 Continuous Infusions:? D5 1/2 NS 50 mL/hr (12/19/21 1208) PRN Meds:acetaminophen, dextrose (GLUCOSE) oral liquid 15 g **OR** fruit juice **OR** skim milk, dextrose (GLUCOSE) oral liquid 30 g **OR** fruit juice, dextrose injection, dextrose injection, glucagon, polyethylene glycol, sodium chlorideObjective: Vitals:Temp:  [97.4 ?F (36.3 ?C)-98.6 ?F (37 ?C)] 97.5 ?F (36.4 ?C)Pulse:  [75-100] 88Resp:  [18-29] 20BP: (130-169)/(76-96) 169/89SpO2:  [94 %-97 %] 96 %Device (Oxygen Therapy): room air on  I/O's:Intake/Output Summary (Last 24 hours) at 12/20/2021 0953Last data filed at 12/20/2021 0500Gross per 24 hour Intake 734.94 ml Output 1400 ml Net -665.06 ml  Physical Exam:Gen: Drowsy, NADEyes:  EOMI, anicteric scleraENMT: MMM, Sparse dentition. Neck: Supple, no JVDCV: nl S1 and S2,Tachycardic, Irregularly Irregular, no m/r/gPulm: CTAB, no wheezes, rales or ronchi. Breathing comfortably. Poor Breathing Effort. GI: Soft, NT, ND, +BS. No guarding or rebound tenderness. Ext: No peripheral edema, WWP. Tight muscle noted on left calf. Neuro: A&O x 1, EOM intact, face symmetric, Responsive to name. Psychiatric: Normal mood and affectSkin: No rashesLabs: Recent Labs Lab 02/16/230601 02/17/230635 02/18/230508 WBC 9.9 9.0 9.0 HGB 12.3* 11.7* 11.7* HCT 37.20* 37.20*  38.00* PLT 161 174 176  Recent Labs Lab 02/16/230601 02/17/230635 02/18/230508 NEUTROPHILS 75.8* 73.3* 70.8  Recent Labs Lab 02/16/230601 02/16/230604 02/17/230635 02/17/231128 02/18/230508 02/18/230608 NA 148*  --  147*  --  146*  --  K 3.8  --  3.7  --  3.7  --  CL 118*  --  119*  --  117*  --  CO2 24  --  23  --  25  --  BUN 51*  --  43*  --  36*  --  CREATININE 1.14  --  1.08  --  1.25  --  GLU 114*   < > 128*   < > 449* 125* ANIONGAP 6  --  5  --  4*  --   < > = values in this interval not displayed.  Recent Labs Lab 02/16/230601 02/17/230635 02/18/230508 CALCIUM 8.5 8.1* 7.8*  Recent Labs Lab 02/15/230920 ALT 16 AST 10 ALKPHOS 57 BILITOT 0.7  Recent Labs Lab 02/15/230920 LABPROT 10.2 INR 0.92  Diagnostics/Radiology:XR Chest PA or APResult Date: 2/15/2023Limited AP view- 1. Opacities the left mid and right lower lung zones concerning for pneumonia in the setting of sepsis. 2. Moderate left pleural effusion. [facility] Radiology Notify System Classification: Orange - Urgent without Follow-up Reported and Signed by:  Lonia Chimera, MD US RenalResult Date: 2/15/2023Normal-appearing kidneys. Thick-walled trabeculated urinary bladder with layering internal debris which may represent urinary tract infection or blood products. Correlate clinically. Small left pleural effusion. Reported and Signed by:  Antonieta Iba, MD Micro:Urine [email or web address] [phone] -----------------------------------------------------------------------------------------------Blood Cx:@LASTMICRO ([email or web address])@ -----------------------------------------------------------------------------------------------Sputum Cx@[email or web address] ([email or web address])@ECG [place] See Events: Assessment & Plan: Attending Provider: Cindra Presume, MD [phone]: None89 y.o. male with past medical history notable for HTN, Afib (Not on [facility]), T2DM, Dementia, Glaucoma, Macular Degeneration, and BPH who presents with toxic metabolic encephalopathy likely 2/2 to bacterial pneumonia. The patient will be admitted for antibiotics and further medical management. ??# Toxic Metabolic Encephalopathy# PneumoniaCOVID, Influenza, and Respiratory Viral Panel Negative. LA 2.7, Procal elevated at 4.27. WBC elevated at 11.9. CXR noted opacities the left mid and right lower lung zones concerning for pneumonia. ABG unremarkable. Hx of Dementia may be contributing to altered state. - c/w Ceftriaxone and Azithromycin (day 4). - f/u Blood Cx: NGTD- f/u Respiratory Cx: Negative- f/u Legionella + Strep Urine: Pending- f/u MRSA: negative- Suction PRN- Saturating well on room air, titrate as tolerated- Holding medications that could alter mental status- Trend Fever Curve- Adjusted fluids to D5 1/2 NS at 50 cc/hr- based on PT evaluation, patient will require STR at discharge.  Discussed with family, who is open to STR.  Case manager was made aware.?# AKI - Improving# Hypernatremia - ImprovingLikely Pre-renal in setting of sepsis. Cr 1.56 on admission. Baseline Cr 1- Bladder Scan q6H PRN + Straight Cath- Condom Cath in Place, OK for foley catheter if persistent- Monitor I&Os- Trend Renal Function, improving- f/u Renal U/S: Normal kidneys. ?UTI vs Blood in Bladder- Adjusted Fluids to D5 1/2 NS at 50 cc/hr# Asymptomatic Bacteriuria- 50,000 to 99,000 Proteus Mirabilis noted on urine cx. - No Symptoms, though hx of UTI and urinary retention- Covered by current abx for Pneumonia. # Nutrition- Patient able to swallow well today. Now placed on pureed diet with nectar-thickened liquids- Aspiration Precautions- Feeding Assist. ?# HypothyroidismTSH 0.328, T4 elevated at 1.55- Restarted PTA Levothyroxine 150 mcg?# Hx of Afib # TachycardiaTachycardia likely in setting of sepsis. Patient is NOT on AC- Restarted PTA Toprol XL 12.5 mg QD- Stopped Lopressor 5 mg q6H- f/u echo:  Normal LV size and function, right  ventricular systolic pressure is severely elevated, mild aortic regurg, mild-to-moderate mitral regurg?# Hx of T2DM- f/u A1c: 5.9%- Low Dose ISS- Not on home medications for diabetes. ?# Chronic Medications- continued home medications                - PRN Tylenol                - Azelastine -> Ordered as Ketotifen 1 drop QD                 - Latanoprost 1 drop nightly                - Timolol 0.5% QD                - Pravastatin 20 mg QD -> ordered as Crestor 5 mg                - Tamsulosin 0.4 mg BID                - Miralax 17g PRN for constipation- held home medications                - Cholecalciferol D3 5000 units QD                - Lexapro 10 mg QD                - Zyprexa 2.5 mg QD- medications to be reconciled??DIET: NPOFLUIDS: D5 1/2 NS at 50 cc/hrELECTROLYTES: Replete PRNPPX: Heparin 5000 TIDACCESS: 2xPIVDISPO: TeleCODE STATUS: DNR/DNIElectronically Signed:Luvina Poirier Carolan Clines, MD PGY1MHB: [phone]

## 2021-12-21 LAB — CBC WITH AUTO DIFFERENTIAL
BKR WAM ABSOLUTE IMMATURE GRANULOCYTES.: 0.33 x 1000/ÂµL — ABNORMAL HIGH (ref 0.00–0.30)
BKR WAM ABSOLUTE LYMPHOCYTE COUNT.: 1.98 x 1000/ÂµL (ref 0.60–3.70)
BKR WAM ABSOLUTE NRBC (2 DEC): 0.02 x 1000/ÂµL (ref 0.00–1.00)
BKR WAM ANALYZER ANC: 6.2 x 1000/ÂµL (ref 2.00–7.60)
BKR WAM BASOPHIL ABSOLUTE COUNT.: 0.03 x 1000/ÂµL (ref 0.00–1.00)
BKR WAM BASOPHILS: 0.3 % (ref 0.0–1.4)
BKR WAM EOSINOPHIL ABSOLUTE COUNT.: 0.04 x 1000/ÂµL (ref 0.00–1.00)
BKR WAM EOSINOPHILS: 0.4 % (ref 0.0–5.0)
BKR WAM HEMATOCRIT (2 DEC): 42.6 % (ref 38.50–50.00)
BKR WAM HEMOGLOBIN: 13.6 g/dL (ref 13.2–17.1)
BKR WAM IMMATURE GRANULOCYTES: 3.5 % — ABNORMAL HIGH (ref 0.0–1.0)
BKR WAM LYMPHOCYTES: 21.3 % (ref 17.0–50.0)
BKR WAM MCH (PG): 30.4 pg (ref 27.0–33.0)
BKR WAM MCHC: 31.9 g/dL (ref 31.0–36.0)
BKR WAM MCV: 95.1 fL (ref 80.0–100.0)
BKR WAM MONOCYTE ABSOLUTE COUNT.: 0.72 x 1000/ÂµL (ref 0.00–1.00)
BKR WAM MONOCYTES: 7.7 % (ref 4.0–12.0)
BKR WAM MPV: 10.5 fL (ref 8.0–12.0)
BKR WAM NEUTROPHILS: 66.8 % (ref 39.0–72.0)
BKR WAM NUCLEATED RED BLOOD CELLS: 0.2 % (ref 0.0–1.0)
BKR WAM PLATELETS: 193 x1000/ÂµL (ref 150–420)
BKR WAM RDW-CV: 12.9 % (ref 11.0–15.0)
BKR WAM RED BLOOD CELL COUNT.: 4.48 M/ÂµL (ref 4.00–6.00)
BKR WAM WHITE BLOOD CELL COUNT: 9.3 x1000/ÂµL (ref 4.0–11.0)

## 2021-12-21 LAB — BASIC METABOLIC PANEL
BKR ANION GAP: 7 (ref 5–18)
BKR BLOOD UREA NITROGEN: 30 mg/dL — ABNORMAL HIGH (ref 8–25)
BKR BUN / CREAT RATIO: 31.3 — ABNORMAL HIGH (ref 8.0–25.0)
BKR CALCIUM: 8.4 mg/dL (ref 8.4–10.3)
BKR CHLORIDE: 119 mmol/L — ABNORMAL HIGH (ref 95–115)
BKR CO2: 23 mmol/L (ref 21–32)
BKR CREATININE: 0.96 mg/dL (ref 0.50–1.30)
BKR EGFR, CREATININE (CKD-EPI 2021): >60 mL/min/{1.73_m2} (ref >=60)
BKR GLUCOSE: 112 mg/dL — ABNORMAL HIGH (ref 70–100)
BKR OSMOLALITY CALCULATION: 303 mosm/kg — ABNORMAL HIGH (ref 275–295)
BKR POTASSIUM: 4.4 mmol/L (ref 3.5–5.1)
BKR SODIUM: 149 mmol/L — ABNORMAL HIGH (ref 136–145)

## 2021-12-21 MED ORDER — DEXTROSE 5 % IN WATER (D5W) INTRAVENOUS SOLUTION
INTRAVENOUS | Status: DC
Start: 2021-12-21 — End: 2021-12-23
  Administered 2021-12-21 – 2021-12-23 (×3): 50.000 mL/h via INTRAVENOUS

## 2021-12-21 NOTE — Plan of Care
Plan of Care Overview/ Patient Status    Dual RN bedside handoff completedPt received resting in bed at bedside shift report. Pt A&OX1 to self, RA, RTM and pulse ox in place. Pt able to answer yes no questions, pt denies chest pain and SOB. Lung sounds clear and slightly diminished at lower lobes bilaterally. +BS in all 4 quadrants, abdomen soft and nontender, last BM 2/16. + PP bilaterally, denies numbness and tingling, plantar and dorsiflexion performed. Pt skin is clean and dry, stage 1 wound to sacrum, mepilex in place. Pt resting in bed, family at the bedside, call bell within reach, bed in lowest position. Safety maintained. @1000  bladder scan 526mL@1330  administered a straight cath on pt as per order, drained .

## 2021-12-21 NOTE — Progress Notes
[facility]	Progress Note  4Attending Provider: Cindra Presume, *Subjective: 24 hour events:No acute overnight Subjective:The patient was examined at the bedside.  Patient is comfortable in bed reporting no acute distress.  He responds to questions, opens his eyes when prompted and follows commands.Meds: Scheduled Meds:Current Facility-Administered Medications Medication Dose Route Frequency Provider Last Rate Last Admin ? azithromycin (ZITHROMAX) tablet 250 mg  250 mg Oral Q24H Roylene Reason [place], DO   250 mg at 12/20/21 1202 ? cefTRIAXone (ROCEPHIN) 1 g in sodium chloride 0.9% PF 10 mL (100 mg/mL)  1 g IV Push Q24H Cindra Presume, MD   1 g at 12/21/21 1014 ? finasteride (PROSCAR) tablet 5 mg  5 mg Oral Daily Roylene Reason [place], DO   5 mg at 12/21/21 1013 ? heparin (porcine) injection 5,000 Units  5,000 Units Subcutaneous Q8H Roylene Reason [place], DO   5,000 Units at 12/21/21 0545 ? insulin lispro (Admelog, HumaLOG) Sliding Scale (See admin instructions for dose) 1-18 Units  1-18 Units Subcutaneous Q6H Roylene Reason [place], DO     ? ketotifen (ZADITOR) 0.025 % (0.035 %) ophthalmic solution 1 drop  1 drop Both Eyes Daily Roylene Reason [place], DO   1 drop at 12/21/21 1015 ? latanoprost (XALATAN) 0.005 % ophthalmic solution 1 drop  1 drop Both Eyes Nightly Roylene Reason [place], DO   1 drop at 12/20/21 2157 ? levothyroxine (SYNTHROID, LEVOTHROID) tablet 150 mcg  150 mcg Oral Daily (0600) Roylene Reason [place], DO   150 mcg at 12/21/21 0546 ? metoprolol succinate (TOPROL-XL) 24 hr tablet 12.5 mg  12.5 mg Oral Daily Roylene Reason [place], DO   12.5 mg at 12/21/21 1012 ? rosuvastatin (CRESTOR) tablet 5 mg  5 mg Oral Daily Roylene Reason [place], DO   5 mg at 12/21/21 1013 ? sodium chloride 0.9 % flush 3 mL  3 mL IV Push Q8H Cindra Presume, MD   3 mL at 12/20/21 2246 ? tamsulosin (FLOMAX) 24 hr capsule 0.4 mg  0.4 mg Oral BID Roylene Reason [place], DO   0.4 mg at 12/21/21 1012 ? timolol (TIMOPTIC) 0.5 % ophthalmic solution 1 drop  1 drop Both Eyes Daily [place] Rao, DO   1 drop at 12/21/21 1015 Continuous Infusions:? dextrose 5 %   PRN Meds:acetaminophen, dextrose (GLUCOSE) oral liquid 15 g **OR** fruit juice **OR** skim milk, dextrose (GLUCOSE) oral liquid 30 g **OR** fruit juice, dextrose injection, dextrose injection, glucagon, polyethylene glycol, sodium chlorideObjective: Vitals:Temp:  [97.4 ?F (36.3 ?C)-98.3 ?F (36.8 ?C)] 97.9 ?F (36.6 ?C)Pulse:  [66-89] 78Resp:  [18-20] 20BP: (144-168)/(63-88) 158/87SpO2:  [94 %-96 %] 96 %Device (Oxygen Therapy): room air on  I/O's:Intake/Output Summary (Last 24 hours) at 12/21/2021 1028Last data filed at 12/21/2021 0200Gross per 24 hour Intake 821.04 ml Output 750 ml Net 71.04 ml  Physical Exam:Gen: Drowsy, NADEyes:  EOMI, anicteric scleraENMT: MMM, Sparse dentition. Neck: Supple, no JVDCV: nl S1 and S2,Tachycardic, Irregularly Irregular, no m/r/gPulm: CTAB, no wheezes, rales or ronchi. Breathing comfortably. Poor Breathing Effort. GI: Soft, NT, ND, +BS. No guarding or rebound tenderness. Ext: No peripheral edema, WWP. Tight muscle noted on left calf. Neuro: A&O x 1, EOM intact, face symmetric, Responsive to name. Psychiatric: Normal mood and affectSkin: No rashesLabs: Recent Labs Lab 02/17/230635 02/18/230508 02/19/230615 WBC 9.0 9.0 9.3 HGB 11.7* 11.7* 13.6 HCT 37.20* 38.00* 42.60 PLT 174 176 193  Recent Labs Lab 02/17/230635 02/18/230508 02/19/230615 NEUTROPHILS 73.3* 70.8 66.8  Recent Labs Lab 02/17/230635 02/17/231128 02/18/230508 02/18/230608 02/19/230615 02/19/230712  NA 147*  --  146*  --  149*  --  K 3.7  --  3.7  --  4.4  --  CL 119*  --  117*  --  119*  --  CO2 23  --  25  --  23  --  BUN 43*  --  36*  --  30*  --  CREATININE 1.08  --  1.25  --  0.96  -- GLU 128*   < > 449*   < > 112* 109* ANIONGAP 5  --  4*  --  7  --   < > = values in this interval not displayed.  Recent Labs Lab 02/17/230635 02/18/230508 02/19/230615 CALCIUM 8.1* 7.8* 8.4  Recent Labs Lab 02/15/230920 ALT 16 AST 10 ALKPHOS 57 BILITOT 0.7  Recent Labs Lab 02/15/230920 LABPROT 10.2 INR 0.92  Diagnostics/Radiology:XR Chest PA or APResult Date: 2/15/2023Limited AP view- 1. Opacities the left mid and right lower lung zones concerning for pneumonia in the setting of sepsis. 2. Moderate left pleural effusion. [facility] Radiology Notify System Classification: Orange - Urgent without Follow-up Reported and Signed by:  Lonia Chimera, MD US RenalResult Date: 2/15/2023Normal-appearing kidneys. Thick-walled trabeculated urinary bladder with layering internal debris which may represent urinary tract infection or blood products. Correlate clinically. Small left pleural effusion. Reported and Signed by:  Antonieta Iba, MD Micro:Urine [email or web address] [phone] -----------------------------------------------------------------------------------------------Blood Cx:@LASTMICRO ([email or web address])@ -----------------------------------------------------------------------------------------------Sputum Cx@[email or web address] ([email or web address])@ECG [place] See Events: Assessment & Plan: Attending Provider: Cindra Presume, MD [phone]: None89 y.o. male with past medical history notable for HTN, Afib (Not on [facility]), T2DM, Dementia, Glaucoma, Macular Degeneration, and BPH who presents with toxic metabolic encephalopathy likely 2/2 to bacterial pneumonia. The patient will be admitted for antibiotics and further medical management. ??# Toxic Metabolic Encephalopathy# PneumoniaCOVID, Influenza, and Respiratory Viral Panel Negative. LA 2.7, Procal elevated at 4.27. WBC elevated at 11.9. CXR noted opacities the left mid and right lower lung zones concerning for pneumonia. ABG unremarkable. Hx of Dementia may be contributing to altered state. - c/w Ceftriaxone (day 5). Azithromycin stopped 2/19- f/u Blood Cx: NGTD- f/u Respiratory Cx: Negative- f/u Legionella + Strep Urine: Pending- f/u MRSA: negative- Suction PRN- Saturating well on room air, titrate as tolerated- Holding medications that could alter mental status- Trend Fever Curve- Adjusted fluids to D5 1/2 NS at 50 cc/hr- based on PT evaluation, patient will require STR at discharge.  Discussed with family, who is open to STR.  Case manager was made aware.?# AKI - Improving# Hypernatremia - ImprovingLikely Pre-renal in setting of sepsis. Cr 1.56 on admission. Baseline Cr 1- Bladder Scan q6H PRN + Straight Cath- Condom Cath in Place, OK for foley catheter if persistent- Monitor I&Os- Trend Renal Function, improving- f/u Renal U/S: Normal kidneys. ?UTI vs Blood in Bladder- Fluids --> D5 at 50 cc/hr# Asymptomatic Bacteriuria- 50,000 to 99,000 Proteus Mirabilis noted on urine cx. - No Symptoms, though hx of UTI and urinary retention- Covered by current abx for Pneumonia. # Nutrition- Patient able to swallow well today. Now placed on pureed diet with nectar-thickened liquids- Aspiration Precautions- Feeding Assist. ?# HypothyroidismTSH 0.328, T4 elevated at 1.55- Restarted PTA Levothyroxine 150 mcg?# Hx of Afib # TachycardiaTachycardia likely in setting of sepsis. Patient is NOT on AC- Restarted PTA Toprol XL 12.5 mg QD- Stopped Lopressor 5 mg q6H- f/u echo:  Normal LV size and function, right ventricular systolic pressure is severely elevated, mild aortic regurg, mild-to-moderate mitral regurg?# Hx of T2DM- f/u A1c: 5.9%- Low Dose ISS- Not on home medications for diabetes. ?# Chronic Medications-  continued home medications                - PRN Tylenol                - Azelastine -> Ordered as Ketotifen 1 drop QD                 - Latanoprost 1 drop nightly                - Timolol 0.5% QD                - Pravastatin 20 mg QD -> ordered as Crestor 5 mg                - Tamsulosin 0.4 mg BID                - Miralax 17g PRN for constipation- held home medications                - Cholecalciferol D3 5000 units QD                - Lexapro 10 mg QD                - Zyprexa 2.5 mg QD- medications to be reconciled??DIET:  dysphagiaFLUIDS: D5 at 50 cc/hrELECTROLYTES: Replete PRNPPX: Heparin 5000 TIDACCESS: 2xPIVDISPO: TeleCODE STATUS: DNR/DNIElectronically Signed:Daunte Oestreich Carolan Clines, MD PGY1MHB: [phone]

## 2021-12-21 NOTE — Progress Notes
[facility] Progress NoteAttending Provider: Cindra Presume, * Subjective                                                                              Subjective: Interim History: Seen this morning.On room air. No fevers.Opens eyes to command, says he is alright.Denies cp, abd pain.No events.Review of Allergies/Meds/Hx: Review of Allergies/Meds/Hx:I have reviewed the patient's: current scheduled medications Objective Objective: Vitals:Last 24 hours: Temp:  [97.4 ?F (36.3 ?C)-98.3 ?F (36.8 ?C)] 97.4 ?F (36.3 ?C)Pulse:  [75-91] 91Resp:  [18-20] 18BP: (144-168)/(78-94) 147/94SpO2:  [95 %-98 %] 98 %Physical ExamVitals reviewed. Constitutional:     Appearance: He is not toxic-appearing or diaphoretic.    Comments: Elderly male resting in bed. HENT:    Head: Normocephalic and atraumatic. Eyes:    General: No scleral icterus.Cardiovascular:    Rate and Rhythm: Normal rate. Pulmonary:    Comments: Breath sounds a bit diminished.Abdominal:    General: Bowel sounds are normal. There is no distension.    Palpations: Abdomen is soft.    Tenderness: There is no abdominal tenderness. Musculoskeletal:    Cervical back: Neck supple.    Right lower leg: No edema.    Left lower leg: No edema. Neurological:    Comments: Eyes closed, open slightly on command, but says a few words. Psychiatric:    Comments: Calm. Labs:Last 24 hours: Recent Results (from the past 24 hour(s)) POC Glucose (Fingerstick)  Collection Time: 12/21/21 12:15 AM Result Value Ref Range  Glucose, Meter 121 (H) 70 - 100 mg/dL POC Glucose (Fingerstick)  Collection Time: 12/21/21  5:43 AM Result Value Ref Range  Glucose, Meter 112 (H) 70 - 100 mg/dL Basic metabolic panel  Collection Time: 12/21/21  6:15 AM Result Value Ref Range  Sodium 149 (H) 136 - 145 mmol/L  Potassium 4.4 3.5 - 5.1 mmol/L  Chloride 119 (H) 95 - 115 mmol/L  CO2 23 21 - 32 mmol/L  Anion Gap 7 5 - 18  Glucose 112 (H) 70 - 100 mg/dL  BUN 30 (H) 8 - 25 mg/dL  Creatinine 7.82 9.56 - 1.30 mg/dL  Calcium 8.4 8.4 - 21.3 mg/dL  BUN/Creatinine Ratio 08.6 (H) 8.0 - 25.0  Osmolality Calculation 303 (H) 275 - 295 mOsm/kg  eGFR (Creatinine) >60 >=60 mL/min/1.46m2 CBC auto differential  Collection Time: 12/21/21  6:15 AM Result Value Ref Range  WBC 9.3 4.0 - 11.0 x1000/?L  RBC 4.48 4.00 - 6.00 M/?L  Hemoglobin 13.6 13.2 - 17.1 g/dL  Hematocrit 57.84 69.62 - 50.00 %  MCV 95.1 80.0 - 100.0 fL  MCH 30.4 27.0 - 33.0 pg  MCHC 31.9 31.0 - 36.0 g/dL  RDW-CV 95.2 84.1 - 32.4 %  Platelets 193 150 - 420 x1000/?L  MPV 10.5 8.0 - 12.0 fL  Neutrophils 66.8 39.0 - 72.0 %  Lymphocytes 21.3 17.0 - 50.0 %  Monocytes 7.7 4.0 - 12.0 %  Eosinophils 0.4 0.0 - 5.0 %  Basophil 0.3 0.0 - 1.4 %  Immature Granulocytes 3.5 (H) 0.0 - 1.0 %  nRBC 0.2 0.0 - 1.0 %  ANC(Abs Neutrophil Count) 6.20 2.00 - 7.60 x 1000/?L  Absolute Lymphocyte Count 1.98 0.60 - 3.70 x 1000/?L  Monocyte  Absolute Count 0.72 0.00 - 1.00 x 1000/?L  Eosinophil Absolute Count 0.04 0.00 - 1.00 x 1000/?L  Basophil Absolute Count 0.03 0.00 - 1.00 x 1000/?L  Absolute Immature Granulocyte Count 0.33 (H) 0.00 - 0.30 x 1000/?L  Absolute nRBC 0.02 0.00 - 1.00 x 1000/?L POC Glucose (Fingerstick)  Collection Time: 12/21/21  7:12 AM Result Value Ref Range  Glucose, Meter 109 (H) 70 - 100 mg/dL POC Glucose (Fingerstick)  Collection Time: 12/21/21 11:37 AM Result Value Ref Range  Glucose, Meter 129 (H) 70 - 100 mg/dL POC Glucose (Fingerstick)  Collection Time: 12/21/21  4:23 PM Result Value Ref Range  Glucose, Meter 109 (H) 70 - 100 mg/dL Labs reviewedDiagnostics:CXR (12/17/21):1. Opacities the left mid and right lower lung zones concerning for pneumonia in the setting of sepsis. 2. Moderate left pleural effusion. ECG/Tele Events: ECG: AFib 92 bpm, QTc 502 ms, no stemi Assessment Assessment: Assessment:88 yo male, resident at [facility], with PMH of AFib (not on A/C), DM (not on meds), HTN, BPH, anxiety, hypothyroidism, cavernous malformation, small traumatic intraparenchymal hemorrhage in 2019, memory loss, gait abnormality.Presented from home with lethargy, tachypnea, hypoxemia. Seems to have bilateral pneumonia. Question sepsis given lethargy (encephalopathy), AKI (cr 1.5 from normal baseline), tachypnea and tachycardia. Retaining urine in ER, had straight cath? Plan Plan: Acute hypoxemic resp failure, bilateral pneumoniaPossible aspiration considering pt eats any type of food at home- Cont O2 support with NC - now on RA- Cont abx Ceftiaxone iv and Azithromycin iv/po (day 5 - dose at 9am) - stop Azithro- Suction prn- Passed SLP eval on 2/17 for dysphagia diet- BCx (2/15) - NGTD- Sputum Cx - ordered / [place]-Strep urine Ag - ordered- RVP - neg- MRSA Cx - neg - Procal - 4.27 - 2.14 - 1.17 - 0.96 - WBC - 11.9k - 9.9 - 9.0 - 9.0 - 9.3- CRP: 18 - 15 - 9Sepsis?- Abx, iv fluids and cultures as above and below- Likely due to PNA, but UCx also with Proteus- UA with wbc 600s, LE, some yeast - UCx - Proteus 50-99k cfu (pan-sens)AKI (cr 1.5 from normal baseline), hypernatremia 148Urine retention?- Cr - 1.56 - 1.14- 1.08 - 1.25 - 0.96- Na - 148 - 148 - 147 - 146 - 149- Change D5-1/2NS at 46ml/h to D5 at 88ml/h- Renal US (2/15) - Thick-walled trabeculated urinary bladder with layering internal debris which may represent urinary tract infection or blood products- Bladder scansAcute encephalopathy; hx of dementia- Treat acute medical issues- If not resolving, may do a Patriot scan- HOLDING Lexapro 10mg  hs, Melatonin qhs and Zyprexa 2.5mg  qhs (lethargy)HTN, HLD- Cont Toprol 25mg  and Pravastatin 20mg  qd qd- Given Metoprolol 5mg  q6h iv sch - stop, now taking po- ECHO (2/17) - EF 55-60%, normal RV size and function, RVSP severely elevated, mild-mod MRDM type 2 - A1c 5.9; give SSIBPH - Cont Proscar 5mg  qd and Flomax 0.4mg  bidHypothyroidism- TSH low 0.328 and FT4 high 1.55- Cont L-thyroxine qd po - may repeat TFTs as outpt when not in acute illnessFEN- SLP (2/17) - Pureed and nectars, aspiration precautions, assist with feedingDVT ppx - HSQCode status: DNRDisposition:- PT consult (2/16) - STR- Possible dc early next week to [facility] if arrangements made and pt is stable Electronically Signed:Brighton Pilley Jordan Likes, MD Beeper 17852/19/2023

## 2021-12-21 NOTE — Plan of Care
Plan of Care Overview/ Patient Status    0230Received pt from medicineA&ox1, responds to question and follows commands appropriately, keeps eyes closed, RTM, no resp distress noted, no chest pain or SOB, ABD soft nontender, redness noted to sacrum and scrotum, incontinent and retains urine, Skin tear to the L back w/ dressing CDI, +PP, +CSM, denies pain, pt is very stiff to arms and legs, able to wiggle toes, bed alarm on, essentials in reach, frequent rounding Patient/Family acknowledge understanding of fall prevention education including to call nurse with assistance with ambulation and video monitoring Yes: Information and education was provided to patient and family ZO:XWRUE Monitoring0400Pt asleep, no signs of distress0600Pt resting in bed, schedule med given, Bladder scanned for 400.

## 2021-12-21 NOTE — Plan of Care
Plan of Care Overview/ Patient Status  Pt appearing asleep at start of shift, afib on tele in the 70's; on RA satting 96%; when pt's name was called several times pt was responsive to simple questions like pain which he denied; disoriented to place, to time and situation; max assist to turn and 2 assist for HS care- incontinent care x2 prior transfer to surg floor, bladder was scanned >550, took out yellow slightly hazy urine-pt tolerated well; pt has a foam dsg to his back CDI; due po med given with applesauce in small amounts tol well with aspiration precaution measures observed; IVF ongoing as ordered; assessed as charted; safety maintained

## 2021-12-22 LAB — CBC WITH AUTO DIFFERENTIAL
BKR WAM ABSOLUTE IMMATURE GRANULOCYTES.: 0.42 x 1000/ÂµL — ABNORMAL HIGH (ref 0.00–0.30)
BKR WAM ABSOLUTE LYMPHOCYTE COUNT.: 2.36 x 1000/ÂµL (ref 0.60–3.70)
BKR WAM ABSOLUTE NRBC (2 DEC): 0.02 x 1000/ÂµL (ref 0.00–1.00)
BKR WAM ANALYZER ANC: 6.72 x 1000/ÂµL (ref 2.00–7.60)
BKR WAM BASOPHIL ABSOLUTE COUNT.: 0.04 x 1000/ÂµL (ref 0.00–1.00)
BKR WAM BASOPHILS: 0.4 % (ref 0.0–1.4)
BKR WAM EOSINOPHIL ABSOLUTE COUNT.: 0.1 x 1000/ÂµL (ref 0.00–1.00)
BKR WAM EOSINOPHILS: 1 % (ref 0.0–5.0)
BKR WAM HEMATOCRIT (2 DEC): 37.8 % — ABNORMAL LOW (ref 38.50–50.00)
BKR WAM HEMOGLOBIN: 12.4 g/dL — ABNORMAL LOW (ref 13.2–17.1)
BKR WAM IMMATURE GRANULOCYTES: 4 % — ABNORMAL HIGH (ref 0.0–1.0)
BKR WAM LYMPHOCYTES: 22.7 % (ref 17.0–50.0)
BKR WAM MCH (PG): 30.7 pg (ref 27.0–33.0)
BKR WAM MCHC: 32.8 g/dL (ref 31.0–36.0)
BKR WAM MCV: 93.6 fL (ref 80.0–100.0)
BKR WAM MONOCYTE ABSOLUTE COUNT.: 0.77 x 1000/ÂµL (ref 0.00–1.00)
BKR WAM MONOCYTES: 7.4 % (ref 4.0–12.0)
BKR WAM MPV: 9.7 fL (ref 8.0–12.0)
BKR WAM NEUTROPHILS: 64.5 % (ref 39.0–72.0)
BKR WAM NUCLEATED RED BLOOD CELLS: 0.2 % (ref 0.0–1.0)
BKR WAM PLATELETS: 184 x1000/ÂµL (ref 150–420)
BKR WAM RDW-CV: 12.7 % (ref 11.0–15.0)
BKR WAM RED BLOOD CELL COUNT.: 4.04 M/ÂµL (ref 4.00–6.00)
BKR WAM WHITE BLOOD CELL COUNT: 10.4 x1000/ÂµL (ref 4.0–11.0)

## 2021-12-22 LAB — BLOOD CULTURE   (BH GH L LMW YH)
BKR BLOOD CULTURE: NO GROWTH
BKR BLOOD CULTURE: NO GROWTH

## 2021-12-22 LAB — BASIC METABOLIC PANEL
BKR ANION GAP: 3 — ABNORMAL LOW (ref 5–18)
BKR BLOOD UREA NITROGEN: 23 mg/dL (ref 8–25)
BKR BUN / CREAT RATIO: 26.7 — ABNORMAL HIGH (ref 8.0–25.0)
BKR CALCIUM: 8.1 mg/dL — ABNORMAL LOW (ref 8.4–10.3)
BKR CHLORIDE: 114 mmol/L (ref 95–115)
BKR CO2: 28 mmol/L (ref 21–32)
BKR CREATININE: 0.86 mg/dL (ref 0.50–1.30)
BKR EGFR, CREATININE (CKD-EPI 2021): >60 mL/min/{1.73_m2} (ref >=60)
BKR GLUCOSE: 107 mg/dL — ABNORMAL HIGH (ref 70–100)
BKR OSMOLALITY CALCULATION: 293 mosm/kg (ref 275–295)
BKR POTASSIUM: 3.5 mmol/L (ref 3.5–5.1)
BKR SODIUM: 145 mmol/L (ref 136–145)

## 2021-12-22 NOTE — Plan of Care
Plan of Care Overview/ Patient Status    Dual RN bedside handoff completedReceived pt from outgoing RN. Pt is A&Ox self. RA, Lung sounds diminished at bases. RTM in place w/ pulse ox visible on monitor. Abdomen is soft, non tender +BS. Incontinent of bowel & bladder. Wound to lower back, mepliex drsg in place. Redness to sacrum and scrotum, barrier cream applied, pt turned and repositioned. +PP. Pt unable to ambulate, turns self in bed with maximum assist. Call bell and essentials within reach. Suction at bedside. Bed alarm active and audible. Hourly rounding continues. 2001: VSS, small sips of thickened water given. Pt able to verbalize needs at this time. 2152: Scheduled meds given, crushed w/ applesauce. 2300: pt voided into diaper, incontinence care provided, pt turned and repositioned. 0000: Blood glucose 115, no coverage given. 0200: Pt observed resting in bed,  No distress noted. 0400: Pt observed sleeping no distress noted.0550: Scheduled meds given, pt denies any c/o

## 2021-12-22 NOTE — Plan of Care
Plan of Care Overview/ Patient Status    Problem: Physical Therapy GoalsGoal: Physical Therapy GoalsDescription: PT GOALS1. Patient will perform bed mobility with supervision2. Patient will perform transfers with supervision using appropriate assistive device 3. Patient will ambulate a minimum of 150 feet with supervision using appropriate assistive device 4. Patient will perform HEP with supervisionOutcome: Interventions implemented as appropriate Physical Therapy Progress Note Patient Overview - 12/22/21 1301    Date of Visit / Treatment  Date of Visit / Treatment 12/22/21   Note Type Progress Note   Start Time 1147   Total Treatment Time 20 min    Patient Overview  History of Present Illness 88 yo male, resident at [place], with PMH of AFib (not on A/C), DM (not on meds), HTN, BPH, anxiety, hypothyroidism, cavernous malformation, small traumatic intraparenchymal hemorrhage in 2019, memory loss, gait abnormality.     Presented from home with lethargy, tachypnea, hypoxemia. Seems to have bilateral pneumonia.   Precautions fall   Social History other (comment)   ALF 24/7 HHA  Social History - Additional Details/Comments Pt lives at [facility]. Pt has private 24/7 aide at baseline and requires assist with mobility and ADLs w/ RW and w/c   Prior Level of Function assist with mobility;assist with ADLs   Subjective I'm ok   General Observations received in bed, NAD, agreeable to PT, private aide present      Assessment - 12/22/21 1301    Assessment  Cognition (Mentation/Communication) alert   Cognition - Additional Details/Comments O x 1   Pain Rating 0   Pain Location - Additional Details/Comments denies any pain   Skin Integrity/Edema see skin documentation   Sensation no complaints   Sensation - Additional Details/Comments unable to accurately assess 2/2 to decreased cognition   Range of Motion within functional limits as demonstrated with mobility/function   Muscle Strength/Tone at least 3/5 as demonstrated with mobility/function   Muscle Strength/Tone - Additional Details/Comments was able to walk to bathroom without buckling   Balance other (comment)   A of 2 with RW  Balance - Additional Details/Comments Poor with RW with A of 2      Functional Mobility - 12/22/21 1301    Functional Mobility  Supine to/from Sit Moderate assist;Assist of 1;Verbal cues   using HOB elevated  Sit to/from Stand Minimum assist;Assist of 2;Verbal cues   Sit to/from Stand Device Actuary Minimum assist;Assist of 2;Verbal cues   Toilet Transfer Device Rolling walker   Ambulation Minimum assist;Assist of 2;Verbal cues   Ambulation Device Rolling walker   Ambulation Distance 10 feet;5 feet   Overall Functional Mobility Comments Pt able to perform sit<>stand with Min A x 2 with RW. Pt with poor balance and kyphotic posture; Cues to keep eyes open during transfer.      AMPAC Basic Mobility - 12/22/21 1301    PT- AM-PAC - Basic Mobility Screen- How much help from another person do you currently need.....  Turning from your back to your side while in a a flat bed without using rails? 2 - A Lot - Requires a lot of help (maximum to moderate assistance). Can use assistive devices.   Moving from lying on your back to sitting on the side of a flat bed without using bed rails? 2 - A Lot - Requires a lot of help (maximum to moderate assistance). Can use assistive devices.   Moving to and from a bed to a chair (including a wheelchair)? 2 -  A Lot - Requires a lot of help (maximum to moderate assistance). Can use assistive devices.   Standing up from a chair using your arms(e.g., wheelchair or bedside chair)? 2 - A Lot - Requires a lot of help (maximum to moderate assistance). Can use assistive devices.   To walk in a hospital room? 2 - A Lot - Requires a lot of help (maximum to moderate assistance). Can use assistive devices.   Climbing 3-5 steps with a railing? 2 - A Lot - Requires a lot of help (maximum to moderate assistance). Can use assistive devices.   AMPAC Mobility Score 12   TARGET Highest Level of Mobility Mobility Level 4, Transfer to chair      Recommendations for IP Admission - 12/22/21 1301    PT Recommendations for Inpatient Admission  Activity/Level of Assist out of bed;assist of 2;transfers only;minimum assistance;with rolling walker   ADL Recommendations bedside commode;assist of 2;minimum assistance;with rolling walker   Therapeutic Exercise encourage exercise program issued   Other/Comments OOB Daily A of 2 with RW       Clinical Impression/Recommendation - 12/22/21 1301    Clinical Impression / Recommendation  Initial Assessment Pt is 88 yo M admitted with PNA. Pt able to perform sit<>stand with Min A x 2 with RW. Pt with poor balance and kyphotic posture; Cues to keep eyes open during transfer and gait .Was able to walk to bathroom with A of 2 with RW, limited by fatigue, weakness and decrease activity tolerance. Pt and private aide educated about the importance of OOB mobility . Left in recliner with NAD, Notified RN.  Recommend STR when medically   Patient Goal return to prior level of function   PT Frequency 3x per week   Physical Therapy Disposition Recommendation Short Term Rehab   Equipment Recommendations for Discharge Patient has all necessary durable medical equipment      PT Handoff - 12/22/21 1301    Handoff Documentation  Handoff Patient in chair;Chair alarm;Patient instructed to call nursing for mobility;Discussed with nursing     Gabriel Carina, PTPhysical Medicine DepartmentGreenwich Page Woonsocket Hospital

## 2021-12-22 NOTE — Progress Notes
[facility] Medicine Progress NoteAttending Provider: Marshell Levan, MD Subjective                                                                              Subjective: Interim History: Alert, on RA; answering simple questions; tolerating purees; no fevers, dyspneaReview of Allergies/Meds/Hx: Review of Allergies/Meds/Hx:I have reviewed the patient's: current scheduled medications Objective Objective: Vitals:Last 24 hours: Temp:  [97.4 ?F (36.3 ?C)-98.3 ?F (36.8 ?C)] 98.3 ?F (36.8 ?C)Pulse:  [70-91] 80Resp:  [16-20] 20BP: (121-151)/(69-94) 121/72SpO2:  [94 %-98 %] 94 %Physical ExamVitals reviewed. Constitutional:     Appearance: He is not toxic-appearing or diaphoretic.    Comments: Elderly male resting in bed. HENT:    Head: Normocephalic and atraumatic. Eyes:    General: No scleral icterus.Cardiovascular:    Rate and Rhythm: Normal rate. Pulmonary:    Comments: No rales or wheezingAbdominal:    General: Bowel sounds are normal. There is no distension.    Palpations: Abdomen is soft.    Tenderness: There is no abdominal tenderness. Musculoskeletal:    Cervical back: Neck supple.    Right lower leg: No edema.    Left lower leg: No edema. Neurological:    Comments: Alert, keeps eyes closed most of the time at baseline; answering simple questions and following simple commands; moving al extremities Psychiatric:    Comments: Calm. Labs:Last 24 hours: Recent Results (from the past 24 hour(s)) POC Glucose (Fingerstick)  Collection Time: 12/21/21 11:37 AM Result Value Ref Range  Glucose, Meter 129 (H) 70 - 100 mg/dL POC Glucose (Fingerstick)  Collection Time: 12/21/21  4:23 PM Result Value Ref Range  Glucose, Meter 109 (H) 70 - 100 mg/dL POC Glucose (Fingerstick)  Collection Time: 12/21/21 11:53 PM Result Value Ref Range  Glucose, Meter 115 (H) 70 - 100 mg/dL POC Glucose (Fingerstick)  Collection Time: 12/22/21  5:30 AM Result Value Ref Range  Glucose, Meter 97 70 - 100 mg/dL Basic metabolic panel  Collection Time: 12/22/21  5:40 AM Result Value Ref Range  Sodium 145 136 - 145 mmol/L  Potassium 3.5 3.5 - 5.1 mmol/L  Chloride 114 95 - 115 mmol/L  CO2 28 21 - 32 mmol/L  Anion Gap 3 (L) 5 - 18  Glucose 107 (H) 70 - 100 mg/dL  BUN 23 8 - 25 mg/dL  Creatinine 2.84 1.32 - 1.30 mg/dL  Calcium 8.1 (L) 8.4 - 10.3 mg/dL  BUN/Creatinine Ratio 44.0 (H) 8.0 - 25.0  Osmolality Calculation 293 275 - 295 mOsm/kg  eGFR (Creatinine) >60 >=60 mL/min/1.38m2 CBC auto differential  Collection Time: 12/22/21  5:40 AM Result Value Ref Range  WBC 10.4 4.0 - 11.0 x1000/?L  RBC 4.04 4.00 - 6.00 M/?L  Hemoglobin 12.4 (L) 13.2 - 17.1 g/dL  Hematocrit 10.27 (L) 25.36 - 50.00 %  MCV 93.6 80.0 - 100.0 fL  MCH 30.7 27.0 - 33.0 pg  MCHC 32.8 31.0 - 36.0 g/dL  RDW-CV 64.4 03.4 - 74.2 %  Platelets 184 150 - 420 x1000/?L  MPV 9.7 8.0 - 12.0 fL  Neutrophils 64.5 39.0 - 72.0 %  Lymphocytes 22.7 17.0 - 50.0 %  Monocytes 7.4 4.0 - 12.0 %  Eosinophils 1.0 0.0 - 5.0 %  Basophil 0.4 0.0 - 1.4 %  Immature Granulocytes 4.0 (H) 0.0 - 1.0 %  nRBC 0.2 0.0 - 1.0 %  ANC(Abs Neutrophil Count) 6.72 2.00 - 7.60 x 1000/?L  Absolute Lymphocyte Count 2.36 0.60 - 3.70 x 1000/?L  Monocyte Absolute Count 0.77 0.00 - 1.00 x 1000/?L  Eosinophil Absolute Count 0.10 0.00 - 1.00 x 1000/?L  Basophil Absolute Count 0.04 0.00 - 1.00 x 1000/?L  Absolute Immature Granulocyte Count 0.42 (H) 0.00 - 0.30 x 1000/?L  Absolute nRBC 0.02 0.00 - 1.00 x 1000/?L Labs reviewedDiagnostics:CXR (12/17/21):1. Opacities the left mid and right lower lung zones concerning for pneumonia in the setting of sepsis. 2. Moderate left pleural effusion. ECG/Tele Events: ECG: AFib 92 bpm, QTc 502 ms, no stemi Assessment Assessment: Assessment:88 yo male, resident at [facility], with PMH of AFib (not on A/C), DM (not on meds), HTN, BPH, anxiety, hypothyroidism, cavernous malformation, small traumatic intraparenchymal hemorrhage in 2019, memory loss, gait abnormality.Presented from home with lethargy, tachypnea, hypoxemia. Seems to have bilateral pneumonia. Question sepsis given lethargy (encephalopathy), AKI (cr 1.5 from normal baseline), tachypnea and tachycardia. Retaining urine in ER, had straight cath? Plan Plan: Acute hypoxemic resp failure, bilateral pneumoniaPossible aspiration considering pt eats any type of food at home- improving; now on RA - competed 5 days of azithromycin on 2/19; Cont abx Ceftiaxone iv until tomorrow - Suction prn- Passed SLP eval on 2/17 for dysphagia diet; purees and nectars; - BCx (2/15) - NGTD- Sputum Cx - ordered / [place]-Strep urine Ag - ordered- RVP - neg- MRSA Cx - neg - WBC, procal, and CRP all improvingSepsis- Abx, iv fluids and cultures as above and below- Likely due to PNA, but UCx also with Proteus ( pansensitive) AKI (cr 1.5 from normal baseline), hypernatremia 148Urine retention?- AKI resolved; Na now OK- has high residuals at baslineAcute encephalopathy; hx of dementia- Treat acute medical issues-  Improved; seems to be getting close to his baseline- HOLDING Lexapro 10mg  hs, Melatonin qhs and Zyprexa 2.5mg  qhs (lethargy); this can  be resumed on dischargeHTN, HLD- Cont Toprol 25mg  and Pravastatin 20mg  qd qd- ECHO (2/17) - EF 55-60%, normal RV size and function, RVSP severely elevated, mild-mod MRDM type 2 - A1c 5.9; diet controlled; can stop SSIBPH - Cont Proscar 5mg  qd and Flomax 0.4mg  bidHypothyroidism- TSH low 0.328 and FT4 high 1.55- Cont L-thyroxine qd po - may repeat TFTs as outpt when not in acute illnessFEN- SLP (2/17) - Pureed and nectars, aspiration precautions, assist with feeding; continue with speech therapy once in STRDVT ppx - HSQCode status: DNRDisposition:- PT consult (2/16) - STR- medically stable for transfer to [facility] once arrangements made Electronically Signed:[place] [address]/20/2023

## 2021-12-22 NOTE — Care Coordination-Inpatient
[facility] Department of Health                                                                                                                                                                                           1RUG II Group (print name):   RHCF Level of Cure    q HRF q SNF                      Hospital and Community  	             Patient Review Instrument (H/C-PRI)                                                                                                                                     Use with separate Hospital and Community [facility] InstructionsI. ADMINISTRATIVE DATA 1. Operating Certificate Number [id number] 2. Social Security Number [id number] 3. OFFICIAL NAME OF HOSPITAL OR OTHER AGENCY/FACILITY COMPLETING THIS REVIEW:[facility]4A. PATIENT NAME (AND COMMUNITY ADDRESS IF REVIEWED IN COMMUNITY). Keith Strickland. COUNTY OF RESIDENCE: [place] 11A. DATE OF HOSPITAL ADMISSION OR INITIAL AGENCY VISIT: 12/17/2021 5. DATE OF PRI COMPLETION: 12/22/2021 11B. DATE OF ALTERNATE LEVEL OF CARE STATUS:(if applicable): 6. MRN: [id number] 12. MEDICAID NUMBER: 7. HOSPITAL ROOM NUMBER: [id number] 13. MEDICARE NUMBER: [id number] 8. NAME OF HOSPITAL UNIT/DIVISION/BUILDING:[facility] 14. PRIMARY PAYOR:        1= Medicaid [   ]        2= Medicare[ x  ] 3= Other[   ]       9. DATE OF BIRTH: 06-12-1919 15. REASON FOR PRI COMPLETION:     1.RHCF Application from Hospital [ x  ]     2. RCF Application from [employer] [   ]     3. Other (specify):  10.SEX: male  II. MEDICAL EVENTS 16. DECUBITUS LEVEL: ENTER THE MOST SEVERE LEVEL (0-5) AS DEFINED IN THE INSTRUCTIONS:  [ 1    ]17. MEDICAL CONDITIONS: DURING THE PAST WEEK. READ THE INSTRUCTIONS FOR SPECIFIC DEFINITIONS.  1=YES 2=NOA. Comatose  2 B. Dehydration  2 C. Internal Bleeding:  2 D. Stasis Ulcer:   2 E. Terminally Ill:  2 F. Contractures:  2 G. Diabetes Mellitus:   1 H. Urinary Tract Infection:  2 I.   HIV Infection Symptomatic:  2 J. Accident  2 K. Ventilator Dependent: 2  18. MEDICAL TREATMENTS: READ THE INSTRUCTIONS FOR QUALIFIERS.                                                                       1=Yes   2=No                                                                      A. Tracheostomy Care/Suctioning:      (Daily -Exclude self care  2 B. Suctioning- General (Daily)  2 C. Oxygen (Daily)  2 D. Respiratory Care (Daily)  2 E. Nasal Gastric Feeding  2 F. Parenteral Feeding  2 G. Wound Care  2 H. Chemotherapy  2 I.  Transfusion  2 J. Dialysis  2 K. Bowl and Bladder Rehabilitation(SEE INSTRUCTIONS)  2 L. Catheter (Indwelling or External)  2 M. Physical Restraints (Daytime Only)  2  III. ACTIVITIES OF DAILY LIVING (ADLs)  Measure the capability of the patient to perform each ADL 60% or more of the time it is performed during the past week (7 days). Read the instructions for the Changed Condition Rule and the definitions of the ADL terms. 19. EATING: PROCESS OF GETTING FOOD BY ANY MEANS FROM THE RECEPTACLE INTO THE BODY (FOR EXAMPLE, PLATE, CUP TUBE) 19. 3 1=  Feeds self without supervision or physical assistance. May use adaptive equipment.  3. = Requires continual help (encouragement/teaching/physical assistance) with eating or meal will not be completed.  2.=  Required intermittent supervision (that is, verbal encouragement/guidance) and/or minimal physical assistance with minor parts of eating, such as cutting food, buttering bread or opening milk carton. 4.= Totally fed by hand, patient does not manually participate.  5. = Tube or parenteral feeding for primary intake of food. (Not just for supplemental nourishments.) 20. MOBILITY: HOW THE PATIENT MOVES ABOUT. 20. 5 1= Walks with no supervision or human assistance. May require mechanical device (for example, a walker,) but not a wheelchair.  2.= Walks with intermittent supervision (that is, verbal cueing and observation). May require human assistance for difficult parts of walking (for example, stairs, ramps.).  3.= Walks with constant one-to-one supervision and/or constant physical assistance. 4.= Wheels with no supervision or assistance, except for difficult maneuvers (for example, elevators, ramps). May actually be able to walk, but generally does not move. 5.=  Is wheeled, chair fast or bedfast. Relies on someone else to move about, if at all.  21. TRANSFER:  PROCESS OF MOVING BETWEEN POSITIONS, TO/FROM BED CHAIR, STANDING, (EXCLUDE TRANSFERS TO/FROM [facility] AND TOILET). 21. 4 1.= Requires no supervision or physical assistance to complete necessary transfers. May use equipment, such as railings, trapeze. 3.= Requires one person to provide constant guidance, steadiness and/or physical assistance. Patient may participate in transfer. 2.= Requires intermittent supervision (that is, verbal cueing, guidance) and/or physical assistance for difficult maneuvers only. 4.= Requires two people to provide constant supervision and/or physically lift. May need lifting equipment.  5.= Cannot and is not gotten out of bed.  22. TOILETING: PROCESS OF GETTING TO AND FROM A TOILET (OR USE OF OTHER TOILETING EQUIPMENT, SUCH AS BEDPAN). TRANSFERRING ON AND OFF TOILE, CLEANSING SELF AFTER ELIMINATION AND ADJUSTING CLOTHES. 22. 4 1.= Requires no supervision or physical assistance. May require special equipment, such as a raised toilet or grab bars.  3.= Continent of bowel and bladder. Required constant supervision and/or physical assistance with major/all parts of the task, including appliances (i.e., colostomy, ileostomy, urinary catheter. 2. = Requires intermittent supervision for safety or encouragement, or minor physical assistance (for example, clothes adjustment or washing hands.). 4.= Incontinent of bowel and/or bladder, and is not taken to a bathroom.  5.= Incontinent of bowel and/or bladder, but is taken to a bathroom every two to four hours during the day and as needed at night. IV. BEHAVIORS 23. VERBAL DISCRUPTIONS:  BY YELLING, BAITING, THREATENING, ETC. 23. 1 1.= No known history. 4.= Unpredictable, recurring verbal disruption at least once during the past week (7 days) for no foretold reason. 2. Known history or occurrences, but not during the past week (7 days). 5.= Patient is level #4 above, but does not fulfill the active treatment and assessment qualifiers (in the instructions). 3.= Short-lived or predictable disruption regardless of frequency (for example, during specific care routines, such as bathing. )  24.  PHYSICAL AGGRESSION:  assaultive or combative to self or others with intent for injury. (  for example hits self, throws objects, punches, dangerous maneuvers with wheelchair) 24. 1 1.= No known history. 4.= Unpredictable, recurring aggression at least once during the past week (7 days) for no apparent or foretold reason (that is , not just during specific care routines or as a reaction to normal stimuli). 2.- Known history or occurrences, but not during the past week (7 days). 5.= Patient is a t level as #4 above, but does not fulfill the active treatment and assessment qualifiers (in the instructions). 3.= Predictable aggression during specific care routines or as a reaction to normal stimuli (for example, bumped into), regardless of frequency. May strike or fight.   25. DISRUPTIVE, INFANTILE OR SOCIALLY INAPPROPRIATE BEHAVIOR: CHILDISH, REPETITIVE OR ANTISOCIAL PHYSICAL BEHAVIOR WHICH CREATES DISRUPTION WITH OTHERS. (FOR EXAMPLE, CONSTANTLY UNDRESSING SELF, STEALING, SMEARING FECES, SEXUALLY DISPLAYING ONESELF TO TOHERS). EXCLUDE VERBAL ACTIONS. READ THE INSTRUCTIONS FOR OTHER EXCLUSSIONS. 25. 1 1.= No know history 4.= Occurrences of this disruptive behavior at least once during the past week (7 days). 2.= Displays this behavior, but is not disruptive to others (for example, rocking in place). 5.= Patient is at level #4 above, but does not fulfill the active treatment and psychiatric assessment qualifiers (in instructions). 3.= Known history or occurrences, but not during the past week. (7 days).  26. HALLUCINATIONS:  EXPERIENCED AT LEAST ONCE DURING THE PAST WEEK, VISUAL, AUDITORY OR TACTILE PERCEPTIONS THAT HAVE NO BASIS IN EXTERNAL REALITY.  26. 2 1.= Yes  2.= No  3.= Yes, but does not fulfill the active treatment and psychiatric assessment qualifiers (in the instructions). V. SPECIALIZED SERVICES 27. PHYSICAL AND OCCUPATIONAL THERAPIES:  READ INSTRUCTIONS AND QUALIFIERS. EXCLUDE REHABILTATIVE NURSES AND OTHER SPECIALIZED THERAPISTS (FOR EXAMPLE, SPEECH THERAPIST). ENTER THE LEVEL, DAYS AND TIME (HOURS AND MINUTES) DURING THE PAST WEEK (7 DAYS).                       A.  Physical Therapy (P.T.)                       B. Occupational Therapy (O.T.)DAYS AND TIME PER WEEK: ENTER THE CURRENT NUMER OF DAYS AND TIME (HOURS AND MINUTES) DURING THE PAST WEEK (7 DAYS) THAT EACH TEHRAPY WAS PROVIDED. ENTER ZERO IF AT #1 LEVEL ABOVE. READ INSTRUCTIONS AS TO QUALIFIERS IN COUNTING DAYS AND TIME.  27.P.T. Level:P.T. Days:P.T. Time:       MIN/Week:O.T. Level:O.T. Days:O.T. Time:      MIN/Week: 28. NUMBER OF PHYSICIAN VISITS: DO NOT ANSWER THIS QUESTION FOR HOSPITALIZED PATIENT, (ENTER ZERO), UNLESS ON ALTERNATE LEVEL OF CARE STATUS. ENTER ONLY THE NUMBER OF VISITS DURING THE PAST WEEK THAT ADHERE TO THE PATIENT NEED AND DOCUMENTATION QUALIFIERS IN THE INSTRUCTIONS. THE PATIENT MUST BE MEDICALLY UNSTABLE TO ENTER ANY PHYSICIAN VISITS. OTHERWISE ENTER A ZERO. 28.  VI. DIAGNOSIS 29. PRIMARY PROBLEM: THE MEDICAL CONDITION REQUIRING THE LARGEST AMOUNT OF NURSING TIME IN THE HOSPITAL OR CARE TIME IF IN THE COMMUNITY. (FOR HOSPITALIZED PATIENTS THIS MAY OR MAY NOT BE THE ADMISSION DIAGNOSIS)                                         ICD-9 Code of medical problem 29.CD-9 Code:If code cannot be located, print medical name here: Pneumonia of both lungs due to infectious organism, unspecified part of lung VII. PLAN OR CARE SUMMARY  This section is to communicate to providers any additional clinical information which may be needed for their preadmission review of the patient. It does not have to be completed if the information below is already provided by your own form, which is attached to this HC/-PRI. 30. DIAGNOSES AND PROGNOSES: FOR EACH DIAGNOSIS DESCRIBE THE PROGNOSIS AND CARE PLAN IMPLICATIONS. Primary1. Pneumonia of both lungs due to infectious organism, unspecified part of lung Prognosis: Secondary (Include Sensory Impairments)1.2.3.4.  31. REHABLITATION POTENTIAL (INFORMATION FROM THERAPIST(S))       A. POTENTIAL DEGREE OF IMPROVEMENT WITH ADL'S WITHIN SIX MONTH (DESCRIBE IN TERMS OF ADL LEVELS ON THE HC-PRI):                   B. CURRENT THERAPY CARE PLAN : DESCRIBE THE TREATMENTS (INCLUDING WHY) AND ANY SPECIAL EQUIPMENT REQUIRED.           32. MEDICATIONS: Please see attachedName Dose Frequency Route Diagnosis requiring Each Medication 33. TREATMENTS: INCLUDE ALL DRESSINGS, IRRIGATIONS, WOUND CARE, OXYGEN. A. Treatments Describe Why Needed Frequency B. NARRATIVE: DESCRIBE SPECIAL DIET, ALLERGIES, ABNORMAL LAB VALUES, PACEMAKER. Allergies:Gluten Protein Medium Other (See Comments) intolerance Lactose Medium Other (See Comments) intolerance Avelox [moxifloxacin] Not Specified   Ciprofloxacin Not Specified   Penicillins Not Specified   Sulfa (sulfonamide Antibiotics) Not Specified   Dysphagia dietSkin tear mid back, cleaned with NS, foam dressing 34. RACE/ETHNIC GROUP: CHOOSE WHICH BEST DECRIBES THE PATIENT'S RACE OR ETHNIC GROUP:1= White   [ x ] 4= Black/Hispanic [  ] 7= American [place] or [place] Native [  ] 2= White/Hispanic  [  ] 5= Asian or Pacific Islander [  ] 8= American [place] or [place] Native/Hispanic [   ] 3= Black [  ] 6= Asian or Pacific Islander/Hispanic [  ] 9= Other [  ]        35. QUALIFIED ASSESSOR: I HAVE PERSONALLY OBSERVED/INTERFIEWED THIS PATIENT AND COMPLETED THIS H/C-PRI : [  ]YES    [   ]NOI CERTIFY THAT THE INFORMATION CONTAINED HEREIN IS A TRUE ABSTRACT OF THE PATIENT'S CONDITION AND MEDICAL RECORD.SIGNATURE OF THE QUALIFIED ASSESSOR__________________Electronically Signed by Loann Quill, RN, February 20, 2023__________________________________IDENTIFICATION NO.________[id number]____________________________[facility] OF HEALTH SCREEN                                                                            Office of Long Term Care-Division of Residential Services________________________________________________________________________________________________________________________A Patient Review Instrument [facility]) or Hospital and Community PRI [facility]) must be completed before beginning the SCREEN form. Refer to the SCREEN Instructions when completing the SCREEN form. IDENTIFICATION1. Facility Operating Certificate Number:  4. Patient/Resident/Person's Name : Aengus Sauceda 2. Patient/Resident/Person's Social Security Number: 5. Date of  HC-PRI or PRI Completion:  3. Name of Person(s) Completing SCREEN: 6a. Date of SCREEN Initiation:  6b. Date of SCREEN Completion:  DIRECT REFERRAL FACTOR FOR RESIDENTIAL HEALTH CARE FACILITY (RHCF)7. YES[   ]   NO[   ] This person has a home in the community (owns or rents a home, lives in an Adult Care Facility or with family or friends) and that residence is still available OR appropriate community based living can be arranged OR this person is eligible for an Adult  Care Facility  Guideline  If Item 7 is marked YES, proceed to DIRECT REFERRAL FACTORS FOR COMMUNITY BASED ASSESSMENT (item 8-12) If item 7 is marked NO, explain on a separate sheet of paper and attach to this form; refer to [facility]. Proceed to REFFERRAL  RECOMMENDATION (ITEM 21). DIRECT REFERRAL FACTORS FOR COMMUNITY BASED ASSESSMENT.Answer all items 8-128. YES[   ]     NO[   ]  This person understands information given and opposes placement/continued stay in a Residential Health Care Facility 9. YES[   ]     NO[   ] This person is aware of the cost of necessary community services and desires to use private resources (e.g., insurance, income, savings) to purchase care at home or in an Adult Care Facility. Evaluator specifically descried all necessary community services and described private resources (such as insurance coverage, savings, income or financial aid provided by a spouse, relative or friend)  that may be available to pay for such services. Medicare and Medicaid should NOT be included as private financial resources   10 YES[   ]    NO[   ] This person has an informal support system. Individuals in this system are willing and are physically and  mentally capable of caring for this person, and providing for most of his/her specific needs.   11 YES[   ]    NO[   ] All ADL responses = 1 or 2 (see PRI or HC-PRI PART III, 19-22) 12 YES[   ]    NO[   ] This person was independent in ADLs prior to most recent acute episode and shows good rate of return of physical and mental Functioning Guideline  If any direct referral factor (item 8-12) is marked YES, refer to a Certified Home Health Agency [facility]) for a community based  assessment. Attach assessment to the SCREEN, then proceed to REFERRAL RECOMMENDATION (ITEM 21). If all referral factors (items 8-12) are marked NO, proceed to  HOME AND CAREGIVING ARRANGEMENTS (ITEM 13) HOME AND CAREGIVING ARRANGEMENTS13 YES[   ]    NO[   ] D. Does C. Total 12 or more hours?  A. Estimate the total number of hours per day that the informal support(s) system is willing and able to provide supervision or assistance to this person.                                                           A.__________ B. Estimate the total number of hours per day that this person can be alone  B.____________ C. Add A and B (A+B=C)                                                                                   C.____________    If item 13D is marked YES proceed to item 16.If item 13D is marked NO, proceed to item 14. 14 YES[   ]    NO[   ]  Can the number  of hours that this person is attended by self or informal supports be expected to increase to 12 or more hours per day within six months Guideline: If Item 14 is marked YES, proceed to item,16. If Item 14 is marked NO, proceed to item 15. 15 If the answer to item 14 is NO, enter reasons(s) (A, B, and/or C): _____ A. This person's physical and/or mental condition is not expected to improve to a degree that would permit increased self -care within six months B. Person has no informal supports. C. Informal supports are unable or unwilling to provide additional assistance, or person  Does not want care from informal supports. Guideline Proceed to item 16 16 YES[   ]    NO[   ]  Is there a need for restorative services documented by a physician or rehabilitation specialist? Guideline  If 16 is marked YES, proceed to item 17.If 16 is marked NO, proceed to item 19. 17 Can this person receive restorative services at home at adult day care, or as an outpatient? Guideline  If item 17 is marked YES, proceed to item 19. If item 17 is marked NO, proceed to item 18. 18. If the answer to item 17 is NO, enter reason(s) ( A, B, and/or C):  ______ A. Restorative services are not available in this person's community.B. Restorative services are too costly or not covered in this person's community.C. This person cannot access restorative services in their community. Guideline Proceed to item 19. 19  YES[   ]    NO[   ]  Does this person have any risk factors that could cause undue risk to self or others if placed in the community? If YES, enter reason(s) (A, B, C and/or D). [    ] A.   This person has a history of unpredictable behaviors and may injure self or others. This condition is not temporary.B.   Comatose (PRI or H-C PRI Part II, 17A) or all ADL responses + 4 or 5 (PRI or H-C OEI Part III, 19-22).C.   Requires constant monitoring due to health threatening medical conditions.D.  Skilled services are needed at least one time per day and cannot be delegated to nonprofessionals or informal supports. Guideline Proceed to item 20 20 YES[   ]    NO[   ]   Based on the answer to item 67, can this person be placed safely in the community without causing undue risk to self or others? Guideline:   Proceed to item 21 REFERRAL RECOMMENDATION21. Based on the information obtained by the screener during the screen assessment, check the principal referral recommendation and reason.       Explain as needed:________________________________________________________________________________________________________________A.  RCF:1.  (   )   A community based assessment was done by a Certified Home Health Agency [facility]), and it was determined that this person cannot be cared for in the                community. This community assessment represents this person's current status.  2.  (   )    This person does not have an available home in the community (does not own or rent a home, is not eligible for an Adult Care Facility, or cannot live with                family or friends):3.  (   )    Appropriate community based living cannot be arranged because this person cannot  be adequately cared for in the community and/or is a risk to self or                     others. 4.  (   )   Both community based and [facility] care are being investigated. Recommendation is [facility].  B.   [facility] for Restorative Services:1.   (   ) This person cannot receive restorative services in their community.  C.   Community:1.   (   ) A [facility] completed a community based assessment and determined that this person can be cared for in the community.  Guideline:   If RHCF  (item 21A) or [facility] for Restorative Services (item 21B) is chose, proceed to item 22.                    IF [facility] (item 21C) is chosen, proceed to item 36. DEMENTIA DIAGNOSIS22  YES[   ]    NO[   ]   Does this person have a dementia diagnosis (including Alzheimer's disease) documented in the medical record? Guideline: Proceed to item 23. LEVEL I REVIEW FOR POSSIBLE MENTAL ILLNESS (MI)23. YES[   ]   NO[   ] Does this person have a serious mental illness? Guideline Proceed to LEVEL I Review for Possible Mental Retardation/Developmental Disability (items 24-26) LEVEL I REVIEW FOR POSSIBLE MENTAL RETARDATION/DEVELOPMENTAL DISABILITY (MR/DD)Answer ALL items [phone]  YES[   ]    NO[   ]  Does this person have a diagnosis or documented history of mental retardation and/or a developmental disability, and did the mental retardation or developmental disability manifest itself prior to age 47, and is it likely to continue indefinitely, resulting in substantial functional limitations in three or more areas of major life activity? 25  YES[   ]    NO[   ] Has this person ever been deemed eligible for and/or received MR/DD services, or has this person been referred by an agency that serves persons with MR/DD? 60  YES[   ]    NO[   ] Does this person present with evidence of cognitive deficits and/or adaptive skill deficits that may indicate the presence of mental retardation or developmental disability? Guideline  If item 23 or any of items 24-26 are marked YES, proceed to Categorical Determinations (items 27-30) If Item 23 and all of items 24-26 are marked NO, proceed to Patient/Resident/Person Disposition (item 36) CATEGORICAL DETERMINATIONSAnswer ALL Items 27-3027  YES[   ]    NO[   ] Does this person qualify for convalescent care? 28  YES[   ]    NO[   ] Is this person seriously physically ill? 28  YES[   ]    NO[   ] Is this person terminally ill? 77  YES[   ]    NO[   ] Is this person to be admitted for a very brief and finite stay or a provisional emergency admission? Guideline:  If any of the items 27-30 are marked YES, proceed to   DANGER TO SELF OR OTHERS QUALIFIERS (item 31).If all are marked NO, proceed to LEVEL II REFERRAL (item 33). DANGER TO SELF OR OTHERS QUALIFIERS31  YES[   ]    NO[   ]  Based on your interview with this person (and/or available informants), and/or a review of this person's medical record, is there any evidence to suggest that this person is , or may have been,  a danger to self or others during the past two years? Guideline If item 31 is marked YES, proceed to item 32. If Item 31 is marked NO, proceed to Patient/Resident/Person Disposition (item 36) 32  YES[   ]    NO[   ] Has this person been deemed a danger to self or others based on a current psychiatric evaluation by a licensed mental health professional? Guideline:   If item 32 is marked YES, proceed to LEVEL II REFERRAL (ITEM 33).If item 32 is marked NO, proceed to Patient/Resident/Person Disposition (item 36). LEVEL II [id number]  Enter the Level II Referral(s): A, B, or C__________ A. Level II mental illness evaluation by the designated mental health review entityB. Level II evaluation by the Office of Mental Retardation and Developmental DisabilitiesC. Both A and B Guideline:  Proceed to item  34. 34  YES[   ]    NO[   ]  I, as the qualified screener, acknowledge that this Patient/Resident/Person and his/her legal representative* have received verbal and written notification that this Patient/Resident/Person is being referred for a Level II Evaluation. Guideline: STOP! Do not complete items 35 through 38 until you have obtained the Level II recommendations from the designated evaluator(s). * Legal representative means an individual whose appointment is mad and regularly reviewed by a state court or agency empowered under state law to appoint and review such officers, and having the authority to consent to health/mental health care or treatment of an individual.                                LEVEL II RECOMMENDATIONS35  YES[   ]    NO[   ] Specialized services are recommended based on the Level II Evaluation(s) Guideline Proceed to item 36 PATIENT/RESIDENT/PERSON DISPOSITION36    ENTER ONE RESPONSE ( A, B, C, D, E, F, G, H ,I ,J): __________A. Home F. Adult Care Facility with home care services B. Home with home care services G. [facility] for restorative services C. Adult Care Facility [facility] for other services D. Inpatient Psychiatric Care I.  Person died E. OMR/DD Residential Placement J. Other(specify): Guideline:    Proceed to Item 37.PATIENT/RESIDENT/PERSON AND/OR LEGAL REPRESENTATIVE AND/OR HEALTH CARE AGENT ACKNOWLEDGEMENT         69   I have had the opportunity to participate in decisions regarding the arrangements for my continuing care, and I have received verbal and written information  regarding the range of services in my community ____________________            _____________________________________________________________________   Date                                            Signature of the patient/resident/person being assessed and/or legal representative and/or health care agentGuideline:    Proceed to item 38.QUALIFIED [id number].  I have personally observed/interviewed this person and completed this SCREEN and I certify that I am a trained and qualified SCREENER and the  information contained herein is a true abstract of this person's current condition and circumstances. ________________________________________________________________________________________________________Print date, name and title of qualified SCREENER Identification Number (Assigned by [place])__________________________________________________________________Signature of qualified SCREENERNOTIFICATION OF NEED FOR LEVEL II EVALUATIONA Level I SCREEN has been completed for Halina Andreas on _________. This notice serves to inform________________________ and his/her legal representative  that a Level II Evaluation is required, due to suspected mental illness and/or mental retardation. The Level II Evaluation will be completed by the [facility] Office of Mental Health and/or Office of Mental Retardation Developmental Disability or designee. ____________________________________________        _______________________________________________Print date, name and title of qualified SCREENER                 SCREENER Identification Number (Assigned by [place])__________________________________________________________Signature of qualified SCREENER

## 2021-12-22 NOTE — Plan of Care
Plan of Care Overview/ Patient Status    Assumed care at 0700. Pt AOx1. Confused responds to name. On ra. VS stable. Nurse hand-off at bedside. Denied pain and sob. On D5 running. Aspiration precautions. FS q6, no needs for coverage. RTM w/ pulse ox. Incontinent. Stage 1 in the lower back. Redness in scrotum and buttocks. Gave all meds per MAR. Pills crushed w/ apple sauce. Caregiver at the bedside, cooperative and attentive to pt. Call bell within reach, bed alarm on, chair alarm, frequent checks done. 4:10pm: Bladder scan of 627 mL of urine, pt saying that he wants to urinate but only able to void a few drops.4:20pm:  Straight cath done and collected of dark yellow urine, MD notified.

## 2021-12-22 NOTE — Plan of Care
Plan of Care Overview/ Patient Status    Problem: Adult Inpatient Plan of CareGoal: Readiness for Transition of CareOutcome: Interventions implemented as appropriate Initial CM Screening completed with positive for CM Evaluation and Planning outcome. Pt will need Care Coordination on discharge. Pt is admit with Dx Pneumonia Of Both Lungs Due To Infectious Organism.   Review of Records : Pt resides at the [facility] with 24/7 care giver.  Received a message from  SW [place] that  she spoke with pt's dtr who is requesting pt to be referred to [facility].   Called and spoke with pt's dtr [name] and confirmed request to send pt to the [facility] - told pts dtr that the referral may not be looked at til Tuesday next week as we are approaching a holiday weekend .  Referral sent via Epic - awaiting bed offer .Will continue to follow and assist with discharge planning .CM Assessment and Discharge Planning  Flowsheet Row Most Recent Value Case Management Evaluation and Plan  Arrived from prior to admission assisted living facility Admitted from: [facility]  Lives With Facility Resident Patient Requires Care Coordination Intervention Due To discharge planning needs/concerns Prior to Hospitalization: Assistance Needed/DME being used Ambulation Documented Insurance Accurate Yes Any financial concerns related to anticipated discharge needs No Patient's home address verified No Patient's PCP of record verified Yes Living Environment   Lives With Facility Resident Current Living Arrangements independent/assisted living facility Source of Clinical History  Patient's clinical history has been reviewed and source of Information is: Medical record Case Manager Attestation  I have reviewed the medical record and completed the above evaluation with the following recommendations. Yes Discharge Planning Coordination Recommendations Discharge Planning Coordination Recommendations STR (short term rehab at a SNF rehab unit) Finalized Plan  Expected Discharge Date 12/22/21  Lajuana Carry. Maple Mirza RN CCM [facility] ED Nurse Case ManagerGreenwich Hospital[address]  [place],   [phone] : [phone]

## 2021-12-22 NOTE — Progress Notes
[facility]	Progress Note  5Attending Provider: Marshell Levan, MDSubjective: 24 hour events:No acute overnight Subjective:The patient was examined at the bedside. He was easy to awaken this morning. The patient was confused as to where he was as he did not realize that he was currently hospitalized. The patient denied any current symptoms, including chest pain, nausea, or shortness of breath. Family ([name]) was updated on her father's condition and is eager for the patient to be discharged to rehab. Meds: Scheduled Meds:Current Facility-Administered Medications Medication Dose Route Frequency Provider Last Rate Last Admin ? cefTRIAXone (ROCEPHIN) 1 g in sodium chloride 0.9% PF 10 mL (100 mg/mL)  1 g IV Push Q24H Cindra Presume, MD   1 g at 12/22/21 0849 ? finasteride (PROSCAR) tablet 5 mg  5 mg Oral Daily Roylene Reason [place], DO   5 mg at 12/22/21 1610 ? heparin (porcine) injection 5,000 Units  5,000 Units Subcutaneous Q8H Roylene Reason [place], DO   5,000 Units at 12/22/21 9604 ? insulin lispro (Admelog, HumaLOG) Sliding Scale (See admin instructions for dose) 1-18 Units  1-18 Units Subcutaneous Q6H Roylene Reason [place], DO     ? ketotifen (ZADITOR) 0.025 % (0.035 %) ophthalmic solution 1 drop  1 drop Both Eyes Daily Roylene Reason [place], DO   1 drop at 12/22/21 5409 ? latanoprost (XALATAN) 0.005 % ophthalmic solution 1 drop  1 drop Both Eyes Nightly Roylene Reason [place], DO   1 drop at 12/20/21 2157 ? levothyroxine (SYNTHROID, LEVOTHROID) tablet 150 mcg  150 mcg Oral Daily (0600) Roylene Reason [place], DO   150 mcg at 12/22/21 8119 ? metoprolol succinate (TOPROL-XL) 24 hr tablet 12.5 mg  12.5 mg Oral Daily Roylene Reason [place], DO   12.5 mg at 12/22/21 1478 ? rosuvastatin (CRESTOR) tablet 5 mg  5 mg Oral Daily Roylene Reason [place], DO   5 mg at 12/22/21 2956 ? sodium chloride 0.9 % flush 3 mL  3 mL IV Push Q8H Cindra Presume, MD   3 mL at 12/21/21 1455 ? tamsulosin (FLOMAX) 24 hr capsule 0.4 mg  0.4 mg Oral BID Roylene Reason [place], DO   0.4 mg at 12/22/21 2130 ? timolol (TIMOPTIC) 0.5 % ophthalmic solution 1 drop  1 drop Both Eyes Daily Roylene Reason [place], DO   1 drop at 12/22/21 0850 Continuous Infusions:? dextrose 5 % 50 mL/hr (12/22/21 0527) PRN Meds:acetaminophen, dextrose (GLUCOSE) oral liquid 15 g **OR** fruit juice **OR** skim milk, dextrose (GLUCOSE) oral liquid 30 g **OR** fruit juice, dextrose injection, dextrose injection, glucagon, polyethylene glycol, sodium chlorideObjective: Vitals:Temp:  [97.4 ?F (36.3 ?C)-98.3 ?F (36.8 ?C)] 98.3 ?F (36.8 ?C)Pulse:  [70-91] 80Resp:  [16-20] 20BP: (121-158)/(69-94) 121/72SpO2:  [94 %-98 %] 94 %Device (Oxygen Therapy): room air on  I/O's:Intake/Output Summary (Last 24 hours) at 12/22/2021 0903Last data filed at 12/21/2021 1300Gross per 24 hour Intake -- Output 600 ml Net -600 ml  Physical Exam:Gen: Awake, NADEyes:  EOMI, anicteric scleraENMT: MMM, Sparse dentition. Neck: Supple, no JVDCV: nl S1 and S2, Normocardic, Irregularly Irregular, no m/r/gPulm: CTAB, no wheezes, rales or ronchi. Breathing comfortably. Poor Breathing Effort. GI: Soft, NT, ND, +BS. No guarding or rebound tenderness. Ext: No peripheral edema, WWP. Neuro: A&O x 1, EOM intact, face symmetric, Responsive to name. Psychiatric: Normal mood and affectSkin: No rashesLabs: Recent Labs Lab 02/18/230508 02/19/230615 02/20/230540 WBC 9.0 9.3 10.4 HGB 11.7* 13.6 12.4* HCT 38.00* 42.60 37.80* PLT 176 193 184  Recent Labs Lab 02/18/230508 02/19/230615 02/20/230540 NEUTROPHILS 70.8 66.8 64.5  Recent Labs Lab 02/18/230508 02/18/230608 02/19/230615 02/19/230712 02/20/230540 NA 146*  --  149*  --  145 K 3.7  --  4.4  --  3.5 CL 117*  --  119*  --  114 CO2 25  --  23  --  28 BUN 36*  --  30*  --  23 CREATININE 1.25 --  0.96  --  0.86 GLU 449*   < > 112*   < > 107* ANIONGAP 4*  --  7  --  3*  < > = values in this interval not displayed.  Recent Labs Lab 02/18/230508 02/19/230615 02/20/230540 CALCIUM 7.8* 8.4 8.1*  Recent Labs Lab 02/15/230920 ALT 16 AST 10 ALKPHOS 57 BILITOT 0.7  Recent Labs Lab 02/15/230920 LABPROT 10.2 INR 0.92  Diagnostics/Radiology:XR Chest PA or APResult Date: 2/15/2023Limited AP view- 1. Opacities the left mid and right lower lung zones concerning for pneumonia in the setting of sepsis. 2. Moderate left pleural effusion. [facility] Radiology Notify System Classification: Orange - Urgent without Follow-up Reported and Signed by:  Lonia Chimera, MD US RenalResult Date: 2/15/2023Normal-appearing kidneys. Thick-walled trabeculated urinary bladder with layering internal debris which may represent urinary tract infection or blood products. Correlate clinically. Small left pleural effusion. Reported and Signed by:  Antonieta Iba, MD Micro:Urine [email or web address] [phone] -----------------------------------------------------------------------------------------------Blood Cx:@LASTMICRO ([email or web address])@ -----------------------------------------------------------------------------------------------Sputum Cx@[email or web address] ([email or web address])@ECG [place] See Events: Assessment & Plan: Attending Provider: Marshell Levan, MD [phone]: None75 y.o. male with past medical history notable for HTN, Afib (Not on [facility]), T2DM, Dementia, Glaucoma, Macular Degeneration, and BPH who presents with toxic metabolic encephalopathy likely 2/2 to bacterial pneumonia. The patient will be admitted for antibiotics and further medical management. The patient is likely set for discharge tomorrow pending STR placement. ??# Toxic Metabolic Encephalopathy# PneumoniaCOVID, Influenza, and Respiratory Viral Panel Negative. LA 2.7, Procal elevated at 4.27. WBC elevated at 11.9. CXR noted opacities the left mid and right lower lung zones concerning for pneumonia. ABG unremarkable. Hx of Dementia may be contributing to altered state. - c/w Ceftriaxone (day 6). Azithromycin stopped 2/19. Plan for likely 7 days of antibiotics. - f/u Blood Cx: NGTD- f/u Respiratory Cx: Negative- f/u Legionella + Strep Urine: Pending- f/u MRSA: negative- Suction PRN- Saturating well on room air, titrate as tolerated- Holding medications that could alter mental status- Trend Fever Curve- Adjusted fluids to D5 1/2 NS at 50 cc/hr- based on PT evaluation, patient will require STR at discharge.  Discussed with family, who is open to STR.  Case manager was made aware.?# AKI - Improving# Hypernatremia - ImprovingLikely Pre-renal in setting of sepsis. Cr 1.56 on admission. Baseline Cr 1- Bladder Scan q6H PRN + Straight Cath- Condom Cath in Place, OK for foley catheter if persistent- Monitor I&Os- Trend Renal Function, improving- f/u Renal U/S: Normal kidneys. ?UTI vs Blood in Bladder- Fluids --> D5 at 50 cc/hr# Asymptomatic Bacteriuria- 50,000 to 99,000 Proteus Mirabilis noted on urine cx. - No Symptoms, though hx of UTI and urinary retention- Covered by current abx for Pneumonia. # Nutrition- Patient able to swallow well today. Now placed on pureed diet with nectar-thickened liquids- Aspiration Precautions- Feeding Assist. ?# HypothyroidismTSH 0.328, T4 elevated at 1.55- c/w PTA Levothyroxine 150 mcg?# Hx of Afib # TachycardiaTachycardia likely in setting of sepsis. Patient is NOT on AC- c/w PTA Toprol XL 12.5 mg QD- f/u echo:  Normal LV size and function, right ventricular systolic pressure is severely elevated, mild aortic regurg, mild-to-moderate mitral regurg?# Hx of T2DM- f/u A1c: 5.9%- Low Dose ISS- Not on home medications for diabetes. ?# Chronic Medications- continued  home medications - PRN Tylenol                - Azelastine -> Ordered as Ketotifen 1 drop QD                 - Latanoprost 1 drop nightly                - Timolol 0.5% QD                - Pravastatin 20 mg QD -> ordered as Crestor 5 mg                - Tamsulosin 0.4 mg BID                - Miralax 17g PRN for constipation- held home medications                - Cholecalciferol D3 5000 units QD                - Lexapro 10 mg QD                - Zyprexa 2.5 mg QD- medications to be reconciled??DIET:  DysphagiaFLUIDS: D5 at 50 cc/hrELECTROLYTES: Replete PRNPPX: Heparin 5000 TIDACCESS: 2xPIVDISPO: FloorCODE STATUS: DNR/DNIElectronically Signed:Dr. Roylene Reason, DO PGY-2Date: 2/20/2023Time: 9:03 AMPager # [employer] # [phone]

## 2021-12-23 DIAGNOSIS — F419 Anxiety disorder, unspecified: Secondary | ICD-10-CM

## 2021-12-23 DIAGNOSIS — J9601 Acute respiratory failure with hypoxia: Secondary | ICD-10-CM

## 2021-12-23 DIAGNOSIS — Z881 Allergy status to other antibiotic agents status: Secondary | ICD-10-CM

## 2021-12-23 DIAGNOSIS — I480 Paroxysmal atrial fibrillation: Secondary | ICD-10-CM

## 2021-12-23 DIAGNOSIS — F32A Depression, unspecified: Secondary | ICD-10-CM

## 2021-12-23 DIAGNOSIS — N179 Acute kidney failure, unspecified: Secondary | ICD-10-CM

## 2021-12-23 DIAGNOSIS — J690 Pneumonitis due to inhalation of food and vomit: Secondary | ICD-10-CM

## 2021-12-23 DIAGNOSIS — H42 Glaucoma in diseases classified elsewhere: Secondary | ICD-10-CM

## 2021-12-23 DIAGNOSIS — A419 Sepsis, unspecified organism: Secondary | ICD-10-CM

## 2021-12-23 DIAGNOSIS — N400 Enlarged prostate without lower urinary tract symptoms: Secondary | ICD-10-CM

## 2021-12-23 DIAGNOSIS — Z66 Do not resuscitate: Secondary | ICD-10-CM

## 2021-12-23 DIAGNOSIS — Z79890 Hormone replacement therapy (postmenopausal): Secondary | ICD-10-CM

## 2021-12-23 DIAGNOSIS — Z8669 Personal history of other diseases of the nervous system and sense organs: Secondary | ICD-10-CM

## 2021-12-23 DIAGNOSIS — Z9889 Other specified postprocedural states: Secondary | ICD-10-CM

## 2021-12-23 DIAGNOSIS — Z91011 Allergy to milk products: Secondary | ICD-10-CM

## 2021-12-23 DIAGNOSIS — Z880 Allergy status to penicillin: Secondary | ICD-10-CM

## 2021-12-23 DIAGNOSIS — R269 Unspecified abnormalities of gait and mobility: Secondary | ICD-10-CM

## 2021-12-23 DIAGNOSIS — E1139 Type 2 diabetes mellitus with other diabetic ophthalmic complication: Secondary | ICD-10-CM

## 2021-12-23 DIAGNOSIS — Z862 Personal history of diseases of the blood and blood-forming organs and certain disorders involving the immune mechanism: Secondary | ICD-10-CM

## 2021-12-23 DIAGNOSIS — F0390 Unspecified dementia without behavioral disturbance: Secondary | ICD-10-CM

## 2021-12-23 DIAGNOSIS — I10 Essential (primary) hypertension: Secondary | ICD-10-CM

## 2021-12-23 DIAGNOSIS — Z91018 Allergy to other foods: Secondary | ICD-10-CM

## 2021-12-23 DIAGNOSIS — Z20822 Contact with and (suspected) exposure to covid-19: Secondary | ICD-10-CM

## 2021-12-23 DIAGNOSIS — E785 Hyperlipidemia, unspecified: Secondary | ICD-10-CM

## 2021-12-23 DIAGNOSIS — K219 Gastro-esophageal reflux disease without esophagitis: Secondary | ICD-10-CM

## 2021-12-23 DIAGNOSIS — Z681 Body mass index (BMI) 19 or less, adult: Secondary | ICD-10-CM

## 2021-12-23 DIAGNOSIS — G928 Other toxic encephalopathy: Secondary | ICD-10-CM

## 2021-12-23 DIAGNOSIS — E039 Hypothyroidism, unspecified: Secondary | ICD-10-CM

## 2021-12-23 DIAGNOSIS — R8271 Bacteriuria: Secondary | ICD-10-CM

## 2021-12-23 DIAGNOSIS — E43 Unspecified severe protein-calorie malnutrition: Secondary | ICD-10-CM

## 2021-12-23 DIAGNOSIS — E870 Hyperosmolality and hypernatremia: Secondary | ICD-10-CM

## 2021-12-23 DIAGNOSIS — J90 Pleural effusion, not elsewhere classified: Secondary | ICD-10-CM

## 2021-12-23 DIAGNOSIS — Z794 Long term (current) use of insulin: Secondary | ICD-10-CM

## 2021-12-23 LAB — CBC WITH AUTO DIFFERENTIAL
BKR WAM ABSOLUTE IMMATURE GRANULOCYTES.: 0.32 x 1000/ÂµL — ABNORMAL HIGH (ref 0.00–0.30)
BKR WAM ABSOLUTE LYMPHOCYTE COUNT.: 2.09 x 1000/ÂµL (ref 0.60–3.70)
BKR WAM ABSOLUTE NRBC (2 DEC): 0 x 1000/ÂµL (ref 0.00–1.00)
BKR WAM ANALYZER ANC: 8.85 x 1000/ÂµL — ABNORMAL HIGH (ref 2.00–7.60)
BKR WAM BASOPHIL ABSOLUTE COUNT.: 0.02 x 1000/ÂµL (ref 0.00–1.00)
BKR WAM BASOPHILS: 0.2 % (ref 0.0–1.4)
BKR WAM EOSINOPHIL ABSOLUTE COUNT.: 0.07 x 1000/ÂµL (ref 0.00–1.00)
BKR WAM EOSINOPHILS: 0.6 % (ref 0.0–5.0)
BKR WAM HEMATOCRIT (2 DEC): 37.2 % — ABNORMAL LOW (ref 38.50–50.00)
BKR WAM HEMOGLOBIN: 12 g/dL — ABNORMAL LOW (ref 13.2–17.1)
BKR WAM IMMATURE GRANULOCYTES: 2.6 % — ABNORMAL HIGH (ref 0.0–1.0)
BKR WAM LYMPHOCYTES: 17.1 % (ref 17.0–50.0)
BKR WAM MCH (PG): 29.9 pg (ref 27.0–33.0)
BKR WAM MCHC: 32.3 g/dL (ref 31.0–36.0)
BKR WAM MCV: 92.8 fL (ref 80.0–100.0)
BKR WAM MONOCYTE ABSOLUTE COUNT.: 0.84 x 1000/ÂµL (ref 0.00–1.00)
BKR WAM MONOCYTES: 6.9 % (ref 4.0–12.0)
BKR WAM MPV: 9.8 fL (ref 8.0–12.0)
BKR WAM NEUTROPHILS: 72.6 % — ABNORMAL HIGH (ref 39.0–72.0)
BKR WAM NUCLEATED RED BLOOD CELLS: 0 % (ref 0.0–1.0)
BKR WAM PLATELETS: 194 x1000/ÂµL (ref 150–420)
BKR WAM RDW-CV: 12.8 % (ref 11.0–15.0)
BKR WAM RED BLOOD CELL COUNT.: 4.01 M/ÂµL (ref 4.00–6.00)
BKR WAM WHITE BLOOD CELL COUNT: 12.2 x1000/ÂµL — ABNORMAL HIGH (ref 4.0–11.0)

## 2021-12-23 LAB — COMPREHENSIVE METABOLIC PANEL
BKR A/G RATIO: 0.7
BKR ALANINE AMINOTRANSFERASE (ALT): 21 U/L (ref 12–78)
BKR ALBUMIN: 2.2 g/dL — ABNORMAL LOW (ref 3.4–5.0)
BKR ALKALINE PHOSPHATASE: 55 U/L (ref 20–135)
BKR ANION GAP: 5 (ref 5–18)
BKR ASPARTATE AMINOTRANSFERASE (AST): 20 U/L (ref 5–37)
BKR AST/ALT RATIO: 1
BKR BILIRUBIN TOTAL: 0.5 mg/dL (ref 0.0–1.0)
BKR BLOOD UREA NITROGEN: 14 mg/dL (ref 8–25)
BKR BUN / CREAT RATIO: 16.5 (ref 8.0–25.0)
BKR CALCIUM: 7.8 mg/dL — ABNORMAL LOW (ref 8.4–10.3)
BKR CHLORIDE: 107 mmol/L (ref 95–115)
BKR CO2: 28 mmol/L (ref 21–32)
BKR CREATININE: 0.85 mg/dL (ref 0.50–1.30)
BKR EGFR, CREATININE (CKD-EPI 2021): >60 mL/min/{1.73_m2} (ref >=60)
BKR GLOBULIN: 3.2 g/dL
BKR GLUCOSE: 108 mg/dL — ABNORMAL HIGH (ref 70–100)
BKR OSMOLALITY CALCULATION: 280 mosm/kg (ref 275–295)
BKR POTASSIUM: 3.1 mmol/L — ABNORMAL LOW (ref 3.5–5.1)
BKR PROTEIN TOTAL: 5.4 g/dL — ABNORMAL LOW (ref 6.4–8.2)
BKR SODIUM: 140 mmol/L (ref 136–145)

## 2021-12-23 LAB — MAGNESIUM: BKR MAGNESIUM: 1.7 mg/dL (ref 1.4–2.2)

## 2021-12-23 LAB — C-REACTIVE PROTEIN     (CRP): BKR C-REACTIVE PROTEIN: 5.3 mg/dL — ABNORMAL HIGH (ref 0.0–1.0)

## 2021-12-23 LAB — COVID-19 CLEARANCE OR FOR PLACEMENT ONLY: BKR SARS-COV-2 RNA (COVID-19) (YH): NEGATIVE

## 2021-12-23 LAB — PROCALCITONIN     (BH GH LMW Q YH): BKR PROCALCITONIN: 0.17 ng/mL

## 2021-12-23 MED ORDER — POTASSIUM BICARBONATE-CITRIC ACID 20 MEQ EFFERVESCENT TABLET
20 mEq | Freq: Once | ORAL | Status: CP
Start: 2021-12-23 — End: ?
  Administered 2021-12-23: 14:00:00 20 mEq via ORAL

## 2021-12-23 MED ORDER — POTASSIUM CHLORIDE ER 20 MEQ TABLET,EXTENDED RELEASE(PART/CRYST)
20 MEQ | Freq: Once | ORAL | Status: CP
Start: 2021-12-23 — End: ?
  Administered 2021-12-23: 14:00:00 20 MEQ via ORAL

## 2021-12-23 MED ORDER — MAGNESIUM OXIDE 400 MG (241.3 MG MAGNESIUM) TABLET
400 mg (241.3 mg magnesium) | ORAL | Status: CP
Start: 2021-12-23 — End: ?
  Administered 2021-12-23: 14:00:00 400 mg (241.3 mg magnesium) via ORAL

## 2021-12-23 NOTE — Plan of Care
Plan of Care Overview/ Patient Status    0700 - dual RN bedside handoff completed General Appearance: Alert to self, confused. RA; no signs of respiratory distress. VSS, pain adequately managed at this time. Lungs: diminished, equal bilateral to auscultation.Denies chest pain or SOBAbdomen: soft, non-tender, + bowel sounds in all quadrants, + flatus.Tolerating dysphasia diet GU: incontinent, bladder scans being monitoredMobility: OOB rolling walker, assist x2(+) pp, (+) csm.Denies numbness and tingling. Good muscle strength, (+) dorsi/plantarflextionDressing: stage 1 to mid/lower back mepilex in placeCall bell placed in reach, bed alarm set. Patient informed to call when they need any assistance,All belolngings are within patients reach.Patient/Family acknowledge understanding of fall prevention education including to call nurse with assistance with ambulation 1450 - Patient discharged via ambulate to [facility] Report called and given to RN. All paperwork and W10 sent with patient. All belongings with private aid. PIV removed.

## 2021-12-23 NOTE — Progress Notes
[facility] Progress NoteAttending Provider: Marshell Levan, MD Subjective                                                                              Subjective: Interim History: Alert, on RA; feels tired; answering simple questions; tolerating purees; no fevers, dyspnea or significant coughReview of Allergies/Meds/Hx: Review of Allergies/Meds/Hx:I have reviewed the patient's: current scheduled medications Objective Objective: Vitals:Last 24 hours: Temp:  [97.4 ?F (36.3 ?C)-99.2 ?F (37.3 ?C)] 97.4 ?F (36.3 ?C)Pulse:  [74-95] 81Resp:  [16-20] 18BP: (139-169)/(76-88) 139/88SpO2:  [90 %-96 %] 94 %Physical ExamVitals reviewed. Constitutional:     Appearance: He is not toxic-appearing or diaphoretic.    Comments: Elderly male resting in bed. HENT:    Head: Normocephalic and atraumatic. Eyes:    General: No scleral icterus.Cardiovascular:    Rate and Rhythm: Normal rate. Pulmonary:    Comments: No rales or wheezingAbdominal:    General: Bowel sounds are normal. There is no distension.    Palpations: Abdomen is soft.    Tenderness: There is no abdominal tenderness. Musculoskeletal:    Cervical back: Neck supple.    Right lower leg: No edema.    Left lower leg: No edema. Neurological:    Comments: Alert, keeps eyes closed most of the time at baseline; answering simple questions and following simple commands; moving al extremities Psychiatric:    Comments: Calm. Labs:Last 24 hours: Recent Results (from the past 24 hour(s)) POC Glucose (Fingerstick)  Collection Time: 12/22/21 11:31 AM Result Value Ref Range  Glucose, Meter 121 (H) 70 - 100 mg/dL POC Glucose (Fingerstick)  Collection Time: 12/22/21  4:57 PM Result Value Ref Range  Glucose, Meter 116 (H) 70 - 100 mg/dL POC Glucose (Fingerstick)  Collection Time: 12/23/21 12:21 AM Result Value Ref Range  Glucose, Meter 116 (H) 70 - 100 mg/dL POC Glucose (Fingerstick)  Collection Time: 12/23/21  5:19 AM Result Value Ref Range  Glucose, Meter 110 (H) 70 - 100 mg/dL Procalcitonin     (BH GH LMW Q YH)  Collection Time: 12/23/21  5:48 AM Result Value Ref Range  Procalcitonin 0.17 See Comment ng/mL CBC auto differential  Collection Time: 12/23/21  5:48 AM Result Value Ref Range  WBC 12.2 (H) 4.0 - 11.0 x1000/?L  RBC 4.01 4.00 - 6.00 M/?L  Hemoglobin 12.0 (L) 13.2 - 17.1 g/dL  Hematocrit 60.45 (L) 40.98 - 50.00 %  MCV 92.8 80.0 - 100.0 fL  MCH 29.9 27.0 - 33.0 pg  MCHC 32.3 31.0 - 36.0 g/dL  RDW-CV 11.9 14.7 - 82.9 %  Platelets 194 150 - 420 x1000/?L  MPV 9.8 8.0 - 12.0 fL  Neutrophils 72.6 (H) 39.0 - 72.0 %  Lymphocytes 17.1 17.0 - 50.0 %  Monocytes 6.9 4.0 - 12.0 %  Eosinophils 0.6 0.0 - 5.0 %  Basophil 0.2 0.0 - 1.4 %  Immature Granulocytes 2.6 (H) 0.0 - 1.0 %  nRBC 0.0 0.0 - 1.0 %  ANC(Abs Neutrophil Count) 8.85 (H) 2.00 - 7.60 x 1000/?L  Absolute Lymphocyte Count 2.09 0.60 - 3.70 x 1000/?L  Monocyte Absolute Count 0.84 0.00 - 1.00 x 1000/?L  Eosinophil Absolute Count 0.07 0.00 - 1.00 x 1000/?L  Basophil Absolute Count  0.02 0.00 - 1.00 x 1000/?L  Absolute Immature Granulocyte Count 0.32 (H) 0.00 - 0.30 x 1000/?L  Absolute nRBC 0.00 0.00 - 1.00 x 1000/?L Comprehensive metabolic panel  Collection Time: 12/23/21  5:48 AM Result Value Ref Range  Sodium 140 136 - 145 mmol/L  Potassium 3.1 (L) 3.5 - 5.1 mmol/L  Chloride 107 95 - 115 mmol/L  CO2 28 21 - 32 mmol/L  Anion Gap 5 5 - 18  Glucose 108 (H) 70 - 100 mg/dL  BUN 14 8 - 25 mg/dL  Creatinine 1.47 8.29 - 1.30 mg/dL  Calcium 7.8 (L) 8.4 - 10.3 mg/dL  BUN/Creatinine Ratio 56.2 8.0 - 25.0  Total Protein 5.4 (L) 6.4 - 8.2 g/dL  Albumin 2.2 (L) 3.4 - 5.0 g/dL  Total Bilirubin 0.5 0.0 - 1.0 mg/dL  Alkaline Phosphatase 55 20 - 135 U/L  Alanine Aminotransferase (ALT) 21 12 - 78 U/L Aspartate Aminotransferase (AST) 20 5 - 37 U/L  Globulin 3.2 g/dL  A/G Ratio 0.7   AST/ALT Ratio 1.0 See Comment  Osmolality Calculation 280 275 - 295 mOsm/kg  eGFR (Creatinine) >60 >=60 mL/min/1.60m2 Magnesium  Collection Time: 12/23/21  5:48 AM Result Value Ref Range  Magnesium 1.7 1.4 - 2.2 mg/dL C-reactive protein  Collection Time: 12/23/21  5:48 AM Result Value Ref Range  C-Reactive Protein 5.3 (H) 0.0 - 1.0 mg/dL [id number] Clearance or Disposition  Collection Time: 12/23/21  9:32 AM  Specimen: Nasopharynx; Viral Result Value Ref Range  SARS-CoV-2 RNA (COVID-19)  Negative Negative Labs reviewedDiagnostics:CXR (12/17/21):1. Opacities the left mid and right lower lung zones concerning for pneumonia in the setting of sepsis. 2. Moderate left pleural effusion. ECG/Tele Events: ECG: AFib 92 bpm, QTc 502 ms, no stemi Assessment Assessment: Assessment:88 yo male, resident at [facility], with PMH of AFib (not on A/C), DM (not on meds), HTN, BPH, anxiety, hypothyroidism, cavernous malformation, small traumatic intraparenchymal hemorrhage in 2019, memory loss, gait abnormality.Presented from home with lethargy, tachypnea, hypoxemia. Seems to have bilateral pneumonia. Question sepsis given lethargy (encephalopathy), AKI (cr 1.5 from normal baseline), tachypnea and tachycardia. Retaining urine in ER, had straight cath? Plan Plan: Acute hypoxemic resp failure, bilateral pneumoniaPossible aspiration considering pt eats any type of food at home- improving; now on RA - competed 5 days of azithromycin on 2/19; completing g7 days of Rocephin today- Passed SLP eval on 2/17 for dysphagia diet; purees and nectars; - BCx (2/15) - NGTD- RVP by PCR  - neg- MRSA Cx - neg - WBC, procal, and CRP all improving - stable for tranfer to STR when arrangement madeSepsis - resolving- Abx, iv fluids and cultures as above and below- Likely due to PNA, but UCx also with Proteus ( pansensitive) AKI (cr 1.5 from normal baseline), hypernatremia 148- AKI resolved; Na now OK- has high bladder residuals at baslineAcute encephalopathy; hx of dementia- Treat acute medical issues-  Improved; seems to be getting close to his baseline- can resume Lexapro 10mg  hs, Melatonin qhs and Zyprexa 2.5mg  qhs on dischargeHTN, HLD- Cont Toprol 25mg  and Pravastatin 20mg  qd qd- ECHO (2/17) - EF 55-60%, normal RV size and function, RVSP severely elevated, mild-mod MRDM type 2 - A1c 5.9; diet controlled; can stop SSIBPH - Cont Proscar 5mg  qd and Flomax 0.4mg  bidHypothyroidism- TSH low 0.328 and FT4 high 1.55- Cont L-thyroxine qd po - may repeat TFTs as outpt when not acutely illFEN- SLP (2/17) - Pureed and nectars, aspiration precautions, assist with feeding; continue with speech therapy once in STRDVT ppx - sc  heparin; can change  to once daily Lovenox while in STRCode status: DNRDisposition:- PT consult (2/16) - STR- medically stable [facility] once arrangements made Electronically Signed:[place] [address]/21/2023

## 2021-12-23 NOTE — Care Coordination-Inpatient
[facility] Department of Health                                                                                                                                                                                           1RUG II Group (print name):   RHCF Level of Cure    q HRF q SNF                      Hospital and Community  	             Patient Review Instrument (H/C-PRI)                                                                                                                                     Use with separate Hospital and Community [facility] InstructionsI. ADMINISTRATIVE DATA 1. Operating Certificate Number [id number] 2. Social Security Number [id number] 3. OFFICIAL NAME OF HOSPITAL OR OTHER AGENCY/FACILITY COMPLETING THIS REVIEW:[facility]4A. PATIENT NAME (AND COMMUNITY ADDRESS IF REVIEWED IN COMMUNITY). Keith Strickland. COUNTY OF RESIDENCE: [place] 11A. DATE OF HOSPITAL ADMISSION OR INITIAL AGENCY VISIT: 12/17/2021 5. DATE OF PRI COMPLETION: 12/23/2021 11B. DATE OF ALTERNATE LEVEL OF CARE STATUS:(if applicable): 6. MRN: [id number] 12. MEDICAID NUMBER: 7. HOSPITAL ROOM NUMBER: [id number] 13. MEDICARE NUMBER: 8. NAME OF HOSPITAL UNIT/DIVISION/BUILDING:[facility] 14. PRIMARY PAYOR:        1= Medicaid [   ]        2= Medicare[ x  ] 3= Other[   ]       9. DATE OF BIRTH: Oct 10, 1919 15. REASON FOR PRI COMPLETION:     1.RHCF Application from Hospital [   ]     2. RCF Application from [employer] [   ]     3. Other (specify):  10.SEX: male  II. MEDICAL EVENTS 16. DECUBITUS LEVEL: ENTER THE MOST SEVERE LEVEL (0-5) AS DEFINED IN THE INSTRUCTIONS:  [ 1    ]17. MEDICAL CONDITIONS: DURING THE PAST WEEK. READ THE INSTRUCTIONS FOR SPECIFIC DEFINITIONS.  1=YES 2=NOA. Comatose  2 B. Dehydration  2 C. Internal Bleeding:  2 D. Stasis Ulcer:   2 E. Terminally Ill:  2 F. Contractures:  2 G. Diabetes Mellitus:   1 H. Urinary Tract Infection:  2 I.   HIV Infection Symptomatic:  2 J. Accident  2 K. Ventilator Dependent:   18. MEDICAL TREATMENTS: READ THE INSTRUCTIONS FOR QUALIFIERS.                                                                       1=Yes   2=No                                                                      A. Tracheostomy Care/Suctioning:      (Daily -Exclude self care  2 B. Suctioning- General (Daily)  2 C. Oxygen (Daily)  2 D. Respiratory Care (Daily)  2 E. Nasal Gastric Feeding  2 F. Parenteral Feeding  2 G. Wound Care  2 H. Chemotherapy  2 I.  Transfusion  2 J. Dialysis  2 K. Bowl and Bladder Rehabilitation(SEE INSTRUCTIONS)  2 L. Catheter (Indwelling or External)  2 M. Physical Restraints (Daytime Only)  2  III. ACTIVITIES OF DAILY LIVING (ADLs)  Measure the capability of the patient to perform each ADL 60% or more of the time it is performed during the past week (7 days). Read the instructions for the Changed Condition Rule and the definitions of the ADL terms. 19. EATING: PROCESS OF GETTING FOOD BY ANY MEANS FROM THE RECEPTACLE INTO THE BODY (FOR EXAMPLE, PLATE, CUP TUBE) 19. 3 1=  Feeds self without supervision or physical assistance. May use adaptive equipment.  3. = Requires continual help (encouragement/teaching/physical assistance) with eating or meal will not be completed.  2.=  Required intermittent supervision (that is, verbal encouragement/guidance) and/or minimal physical assistance with minor parts of eating, such as cutting food, buttering bread or opening milk carton. 4.= Totally fed by hand, patient does not manually participate.  5. = Tube or parenteral feeding for primary intake of food. (Not just for supplemental nourishments.) 20. MOBILITY: HOW THE PATIENT MOVES ABOUT. 20. 5 1= Walks with no supervision or human assistance. May require mechanical device (for example, a walker,) but not a wheelchair.  2.= Walks with intermittent supervision (that is, verbal cueing and observation). May require human assistance for difficult parts of walking (for example, stairs, ramps.).  3.= Walks with constant one-to-one supervision and/or constant physical assistance. 4.= Wheels with no supervision or assistance, except for difficult maneuvers (for example, elevators, ramps). May actually be able to walk, but generally does not move. 5.=  Is wheeled, chair fast or bedfast. Relies on someone else to move about, if at all.  21. TRANSFER:  PROCESS OF MOVING BETWEEN POSITIONS, TO/FROM BED CHAIR, STANDING, (EXCLUDE TRANSFERS TO/FROM [facility] AND TOILET). 21. 4 1.= Requires no supervision or physical assistance to complete necessary transfers. May use equipment, such as railings, trapeze. 3.= Requires one person to provide constant guidance, steadiness and/or physical assistance. Patient may participate in transfer. 2.= Requires intermittent supervision (that is, verbal cueing, guidance) and/or physical assistance for difficult maneuvers only. 4.= Requires two people to provide constant supervision and/or physically lift. May need lifting equipment.  5.= Cannot and is not gotten out of bed.  22. TOILETING: PROCESS OF GETTING TO AND FROM A TOILET (OR USE OF OTHER TOILETING EQUIPMENT, SUCH AS BEDPAN). TRANSFERRING ON AND OFF TOILE, CLEANSING SELF AFTER ELIMINATION AND ADJUSTING CLOTHES. 22. 4 1.= Requires no supervision or physical assistance. May require special equipment, such as a raised toilet or grab bars.  3.= Continent of bowel and bladder. Required constant supervision and/or physical assistance with major/all parts of the task, including appliances (i.e., colostomy, ileostomy, urinary catheter. 2. = Requires intermittent supervision for safety or encouragement, or minor physical assistance (for example, clothes adjustment or washing hands.). 4.= Incontinent of bowel and/or bladder, and is not taken to a bathroom.  5.= Incontinent of bowel and/or bladder, but is taken to a bathroom every two to four hours during the day and as needed at night. IV. BEHAVIORS 23. VERBAL DISCRUPTIONS:  BY YELLING, BAITING, THREATENING, ETC. 23. 1 1.= No known history. 4.= Unpredictable, recurring verbal disruption at least once during the past week (7 days) for no foretold reason. 2. Known history or occurrences, but not during the past week (7 days). 5.= Patient is level #4 above, but does not fulfill the active treatment and assessment qualifiers (in the instructions). 3.= Short-lived or predictable disruption regardless of frequency (for example, during specific care routines, such as bathing. )  24.  PHYSICAL AGGRESSION:  assaultive or combative to self or others with intent for injury. (  for example hits self, throws objects, punches, dangerous maneuvers with wheelchair) 24. 1 1.= No known history. 4.= Unpredictable, recurring aggression at least once during the past week (7 days) for no apparent or foretold reason (that is , not just during specific care routines or as a reaction to normal stimuli). 2.- Known history or occurrences, but not during the past week (7 days). 5.= Patient is a t level as #4 above, but does not fulfill the active treatment and assessment qualifiers (in the instructions). 3.= Predictable aggression during specific care routines or as a reaction to normal stimuli (for example, bumped into), regardless of frequency. May strike or fight.   25. DISRUPTIVE, INFANTILE OR SOCIALLY INAPPROPRIATE BEHAVIOR: CHILDISH, REPETITIVE OR ANTISOCIAL PHYSICAL BEHAVIOR WHICH CREATES DISRUPTION WITH OTHERS. (FOR EXAMPLE, CONSTANTLY UNDRESSING SELF, STEALING, SMEARING FECES, SEXUALLY DISPLAYING ONESELF TO TOHERS). EXCLUDE VERBAL ACTIONS. READ THE INSTRUCTIONS FOR OTHER EXCLUSSIONS. 25. 1 1.= No know history 4.= Occurrences of this disruptive behavior at least once during the past week (7 days). 2.= Displays this behavior, but is not disruptive to others (for example, rocking in place). 5.= Patient is at level #4 above, but does not fulfill the active treatment and psychiatric assessment qualifiers (in instructions). 3.= Known history or occurrences, but not during the past week. (7 days).  26. HALLUCINATIONS:  EXPERIENCED AT LEAST ONCE DURING THE PAST WEEK, VISUAL, AUDITORY OR TACTILE PERCEPTIONS THAT HAVE NO BASIS IN EXTERNAL REALITY.  26. 2 1.= Yes  2.= No  3.= Yes, but does not fulfill the active treatment and psychiatric assessment qualifiers (in the instructions). V. SPECIALIZED SERVICES 27. PHYSICAL AND OCCUPATIONAL THERAPIES:  READ INSTRUCTIONS AND QUALIFIERS. EXCLUDE REHABILTATIVE NURSES AND OTHER SPECIALIZED THERAPISTS (FOR EXAMPLE, SPEECH THERAPIST). ENTER THE LEVEL, DAYS AND TIME (HOURS AND MINUTES) DURING THE PAST WEEK (7 DAYS).                       A.  Physical Therapy (P.T.)                       B. Occupational Therapy (O.T.)DAYS AND TIME PER WEEK: ENTER THE CURRENT NUMER OF DAYS AND TIME (HOURS AND MINUTES) DURING THE PAST WEEK (7 DAYS) THAT EACH TEHRAPY WAS PROVIDED. ENTER ZERO IF AT #1 LEVEL ABOVE. READ INSTRUCTIONS AS TO QUALIFIERS IN COUNTING DAYS AND TIME.  27.P.T. Level:P.T. Days:P.T. Time:       MIN/Week:O.T. Level:O.T. Days:O.T. Time:      MIN/Week: 28. NUMBER OF PHYSICIAN VISITS: DO NOT ANSWER THIS QUESTION FOR HOSPITALIZED PATIENT, (ENTER ZERO), UNLESS ON ALTERNATE LEVEL OF CARE STATUS. ENTER ONLY THE NUMBER OF VISITS DURING THE PAST WEEK THAT ADHERE TO THE PATIENT NEED AND DOCUMENTATION QUALIFIERS IN THE INSTRUCTIONS. THE PATIENT MUST BE MEDICALLY UNSTABLE TO ENTER ANY PHYSICIAN VISITS. OTHERWISE ENTER A ZERO. 28.  VI. DIAGNOSIS 29. PRIMARY PROBLEM: THE MEDICAL CONDITION REQUIRING THE LARGEST AMOUNT OF NURSING TIME IN THE HOSPITAL OR CARE TIME IF IN THE COMMUNITY. (FOR HOSPITALIZED PATIENTS THIS MAY OR MAY NOT BE THE ADMISSION DIAGNOSIS)                                         ICD-9 Code of medical problem 29.CD-9 Code:If code cannot be located, print medical name here: Pneumonia of both lungs due to infectious organism, unspecified part of lung VII. PLAN OR CARE SUMMARY  This section is to communicate to providers any additional clinical information which may be needed for their preadmission review of the patient. It does not have to be completed if the information below is already provided by your own form, which is attached to this HC/-PRI. 30. DIAGNOSES AND PROGNOSES: FOR EACH DIAGNOSIS DESCRIBE THE PROGNOSIS AND CARE PLAN IMPLICATIONS. Primary1. Pneumonia of both lungs due to infectious organism, unspecified part of lung Prognosis: Secondary (Include Sensory Impairments)1.2.3.4.  31. REHABLITATION POTENTIAL (INFORMATION FROM THERAPIST(S))       A. POTENTIAL DEGREE OF IMPROVEMENT WITH ADL'S WITHIN SIX MONTH (DESCRIBE IN TERMS OF ADL LEVELS ON THE HC-PRI):                   B. CURRENT THERAPY CARE PLAN : DESCRIBE THE TREATMENTS (INCLUDING WHY) AND ANY SPECIAL EQUIPMENT REQUIRED.           32. MEDICATIONS: Please see attachedName Dose Frequency Route Diagnosis requiring Each Medication 33. TREATMENTS: INCLUDE ALL DRESSINGS, IRRIGATIONS, WOUND CARE, OXYGEN. A. Treatments Describe Why Needed Frequency B. NARRATIVE: DESCRIBE SPECIAL DIET, ALLERGIES, ABNORMAL LAB VALUES, PACEMAKER. Allergies:Gluten Protein Medium Other (See Comments) intolerance Lactose Medium Other (See Comments) intolerance Avelox [moxifloxacin] Not Specified   Ciprofloxacin Not Specified   Penicillins Not Specified   Sulfa (sulfonamide Antibiotics) Not Specified   Dysphagia dietSkin tear mid back, cleaned with NS, foam dressing 34. RACE/ETHNIC GROUP: CHOOSE WHICH BEST DECRIBES THE PATIENT'S RACE OR ETHNIC GROUP:1= White   [ x ] 4= Black/Hispanic [  ] 7= American [place] or [place] Native [  ] 2= White/Hispanic  [  ] 5= Asian or Pacific Islander [  ] 8= American [place] or [place] Native/Hispanic [   ] 3= Black [  ] 6= Asian or Pacific Islander/Hispanic [  ] 9= Other [  ]        35. QUALIFIED ASSESSOR: I HAVE PERSONALLY OBSERVED/INTERFIEWED THIS PATIENT AND COMPLETED THIS H/C-PRI : [  ]YES    [   ]NOI CERTIFY THAT THE INFORMATION CONTAINED HEREIN IS A TRUE ABSTRACT OF THE PATIENT'S CONDITION AND MEDICAL RECORD.SIGNATURE OF THE QUALIFIED ASSESSOR______________Electronically Signed by Loann Quill, RN, February 21, 2023______________________________________IDENTIFICATION NO.____________[id number]________________________[facility] OF HEALTH SCREEN                                                                            Office of Long Term Care-Division of Residential Services________________________________________________________________________________________________________________________A Patient Review Instrument [facility]) or Hospital and Community PRI [facility]) must be completed before beginning the SCREEN form. Refer to the SCREEN Instructions when completing the SCREEN form. IDENTIFICATION1. Facility Operating Certificate Number:  4. Patient/Resident/Person's Name : Cobin Cadavid 2. Patient/Resident/Person's Social Security Number: 5. Date of  HC-PRI or PRI Completion:  3. Name of Person(s) Completing SCREEN: 6a. Date of SCREEN Initiation:  6b. Date of SCREEN Completion:  DIRECT REFERRAL FACTOR FOR RESIDENTIAL HEALTH CARE FACILITY ([facility])7. YES[   ]   NO[   ] This person has a home in the community (owns or rents a home, lives in an Adult Care Facility or with family or friends) and that residence is still available OR appropriate community based living can be arranged OR this person is eligible for an Adult  Care Facility  Guideline  If Item 7 is marked YES, proceed to DIRECT REFERRAL FACTORS FOR COMMUNITY BASED ASSESSMENT (item 8-12) If item 7 is marked NO, explain on a separate sheet of paper and attach to this form; refer to [facility]. Proceed to REFFERRAL  RECOMMENDATION (ITEM 21). DIRECT REFERRAL FACTORS FOR COMMUNITY BASED ASSESSMENT.Answer all items 8-128. YES[   ]     NO[   ]  This person understands information given and opposes placement/continued stay in a Residential Health Care Facility 9. YES[   ]     NO[   ] This person is aware of the cost of necessary community services and desires to use private resources (e.g., insurance, income, savings) to purchase care at home or in an Adult Care Facility. Evaluator specifically descried all necessary community services and described private resources (such as insurance coverage, savings, income or financial aid provided by a spouse, relative or friend)  that may be available to pay for such services. Medicare and Medicaid should NOT be included as private financial resources   10 YES[   ]    NO[   ] This person has an informal support system. Individuals in this system are willing and are physically and  mentally capable of caring for this person, and providing for most of his/her specific needs.   11 YES[   ]    NO[   ] All ADL responses = 1 or 2 (see PRI or HC-PRI PART III, 19-22) 12 YES[   ]    NO[   ] This person was independent in ADLs prior to most recent acute episode and shows good rate of return of physical and mental Functioning Guideline  If any direct referral factor (item 8-12) is marked YES, refer to a Certified Home Health Agency [facility]) for a community based  assessment. Attach assessment to the SCREEN, then proceed to REFERRAL RECOMMENDATION (ITEM 21). If all referral factors (items 8-12) are marked NO, proceed to  HOME AND CAREGIVING ARRANGEMENTS (ITEM 13) HOME AND CAREGIVING ARRANGEMENTS13 YES[   ]    NO[   ] D. Does C. Total 12 or more hours?  A. Estimate the total number of hours per day that the informal support(s) system is willing and able to provide supervision or assistance to this person.                                                           A.__________ B. Estimate the total number of hours per day that this person can be alone  B.____________ C. Add A and B (A+B=C)                                                                                   C.____________    If item 13D is marked YES proceed to item 16.If item 13D is marked NO, proceed to item 14. 14 YES[   ]    NO[   ]  Can the number  of hours that this person is attended by self or informal supports be expected to increase to 12 or more hours per day within six months Guideline: If Item 14 is marked YES, proceed to item,16. If Item 14 is marked NO, proceed to item 15. 15 If the answer to item 14 is NO, enter reasons(s) (A, B, and/or C): _____ A. This person's physical and/or mental condition is not expected to improve to a degree that would permit increased self -care within six months B. Person has no informal supports. C. Informal supports are unable or unwilling to provide additional assistance, or person  Does not want care from informal supports. Guideline Proceed to item 16 16 YES[   ]    NO[   ]  Is there a need for restorative services documented by a physician or rehabilitation specialist? Guideline  If 16 is marked YES, proceed to item 17.If 16 is marked NO, proceed to item 19. 17 Can this person receive restorative services at home at adult day care, or as an outpatient? Guideline  If item 17 is marked YES, proceed to item 19. If item 17 is marked NO, proceed to item 18. 18. If the answer to item 17 is NO, enter reason(s) ( A, B, and/or C):  ______ A. Restorative services are not available in this person's community.B. Restorative services are too costly or not covered in this person's community.C. This person cannot access restorative services in their community. Guideline Proceed to item 19. 19  YES[   ]    NO[   ]  Does this person have any risk factors that could cause undue risk to self or others if placed in the community? If YES, enter reason(s) (A, B, C and/or D). [    ] A.   This person has a history of unpredictable behaviors and may injure self or others. This condition is not temporary.B.   Comatose (PRI or H-C PRI Part II, 17A) or all ADL responses + 4 or 5 (PRI or H-C OEI Part III, 19-22).C.   Requires constant monitoring due to health threatening medical conditions.D.  Skilled services are needed at least one time per day and cannot be delegated to nonprofessionals or informal supports. Guideline Proceed to item 20 20 YES[   ]    NO[   ]   Based on the answer to item 24, can this person be placed safely in the community without causing undue risk to self or others? Guideline:   Proceed to item 21 REFERRAL RECOMMENDATION21. Based on the information obtained by the screener during the screen assessment, check the principal referral recommendation and reason.       Explain as needed:________________________________________________________________________________________________________________A.  RCF:1.  (   )   A community based assessment was done by a Certified Home Health Agency [facility]), and it was determined that this person cannot be cared for in the                community. This community assessment represents this person's current status.  2.  (   )    This person does not have an available home in the community (does not own or rent a home, is not eligible for an Adult Care Facility, or cannot live with                family or friends):3.  (   )    Appropriate community based living cannot be arranged because this person cannot  be adequately cared for in the community and/or is a risk to self or                     others. 4.  (   )   Both community based and [facility] care are being investigated. Recommendation is [facility].  B.   [facility] for Restorative Services:1.   (   ) This person cannot receive restorative services in their community.  C.   Community:1.   (   ) A [facility] completed a community based assessment and determined that this person can be cared for in the community.  Guideline:   If [facility]  (item 21A) or [facility] for Restorative Services (item 21B) is chose, proceed to item 22.                    IF [facility] (item 21C) is chosen, proceed to item 36. DEMENTIA DIAGNOSIS22  YES[   ]    NO[   ]   Does this person have a dementia diagnosis (including Alzheimer's disease) documented in the medical record? Guideline: Proceed to item 23. LEVEL I REVIEW FOR POSSIBLE MENTAL ILLNESS (MI)23. YES[   ]   NO[   ] Does this person have a serious mental illness? Guideline Proceed to LEVEL I Review for Possible Mental Retardation/Developmental Disability (items 24-26) LEVEL I REVIEW FOR POSSIBLE MENTAL RETARDATION/DEVELOPMENTAL DISABILITY (MR/DD)Answer ALL items [phone]  YES[   ]    NO[   ]  Does this person have a diagnosis or documented history of mental retardation and/or a developmental disability, and did the mental retardation or developmental disability manifest itself prior to age 13, and is it likely to continue indefinitely, resulting in substantial functional limitations in three or more areas of major life activity? 25  YES[   ]    NO[   ] Has this person ever been deemed eligible for and/or received MR/DD services, or has this person been referred by an agency that serves persons with MR/DD? 60  YES[   ]    NO[   ] Does this person present with evidence of cognitive deficits and/or adaptive skill deficits that may indicate the presence of mental retardation or developmental disability? Guideline  If item 23 or any of items 24-26 are marked YES, proceed to Categorical Determinations (items 27-30) If Item 23 and all of items 24-26 are marked NO, proceed to Patient/Resident/Person Disposition (item 36) CATEGORICAL DETERMINATIONSAnswer ALL Items 27-3027  YES[   ]    NO[   ] Does this person qualify for convalescent care? 28  YES[   ]    NO[   ] Is this person seriously physically ill? 47  YES[   ]    NO[   ] Is this person terminally ill? 77  YES[   ]    NO[   ] Is this person to be admitted for a very brief and finite stay or a provisional emergency admission? Guideline:  If any of the items 27-30 are marked YES, proceed to   DANGER TO SELF OR OTHERS QUALIFIERS (item 31).If all are marked NO, proceed to LEVEL II REFERRAL (item 33). DANGER TO SELF OR OTHERS QUALIFIERS31  YES[   ]    NO[   ]  Based on your interview with this person (and/or available informants), and/or a review of this person's medical record, is there any evidence to suggest that this person is , or may have been,  a danger to self or others during the past two years? Guideline If item 31 is marked YES, proceed to item 32. If Item 31 is marked NO, proceed to Patient/Resident/Person Disposition (item 36) 32  YES[   ]    NO[   ] Has this person been deemed a danger to self or others based on a current psychiatric evaluation by a licensed mental health professional? Guideline:   If item 32 is marked YES, proceed to LEVEL II REFERRAL (ITEM 33).If item 32 is marked NO, proceed to Patient/Resident/Person Disposition (item 36). LEVEL II [id number]  Enter the Level II Referral(s): A, B, or C__________ A. Level II mental illness evaluation by the designated mental health review entityB. Level II evaluation by the Office of Mental Retardation and Developmental DisabilitiesC. Both A and B Guideline:  Proceed to item  34. 34  YES[   ]    NO[   ]  I, as the qualified screener, acknowledge that this Patient/Resident/Person and his/her legal representative* have received verbal and written notification that this Patient/Resident/Person is being referred for a Level II Evaluation. Guideline: STOP! Do not complete items 35 through 38 until you have obtained the Level II recommendations from the designated evaluator(s). * Legal representative means an individual whose appointment is mad and regularly reviewed by a state court or agency empowered under state law to appoint and review such officers, and having the authority to consent to health/mental health care or treatment of an individual.                                LEVEL II RECOMMENDATIONS35  YES[   ]    NO[   ] Specialized services are recommended based on the Level II Evaluation(s) Guideline Proceed to item 36 PATIENT/RESIDENT/PERSON DISPOSITION36    ENTER ONE RESPONSE ( A, B, C, D, E, F, G, H ,I ,J): __________A. Home F. Adult Care Facility with home care services B. Home with home care services G. [facility] for restorative services C. Adult Care Facility [facility] for other services D. Inpatient Psychiatric Care I.  Person died E. OMR/DD Residential Placement J. Other(specify): Guideline:    Proceed to Item 37.PATIENT/RESIDENT/PERSON AND/OR LEGAL REPRESENTATIVE AND/OR HEALTH CARE AGENT ACKNOWLEDGEMENT         55   I have had the opportunity to participate in decisions regarding the arrangements for my continuing care, and I have received verbal and written information  regarding the range of services in my community ____________________            _____________________________________________________________________   Date                                            Signature of the patient/resident/person being assessed and/or legal representative and/or health care agentGuideline:    Proceed to item 38.QUALIFIED [id number].  I have personally observed/interviewed this person and completed this SCREEN and I certify that I am a trained and qualified SCREENER and the  information contained herein is a true abstract of this person's current condition and circumstances. ________________________________________________________________________________________________________Print date, name and title of qualified SCREENER Identification Number (Assigned by [place])__________________________________________________________________Signature of qualified SCREENERNOTIFICATION OF NEED FOR LEVEL II EVALUATIONA Level I SCREEN has been completed for Halina Andreas on _________. This notice serves to inform________________________ and his/her legal representative  that a Level II Evaluation is required, due to suspected mental illness and/or mental retardation. The Level II Evaluation will be completed by the [facility] Office of Mental Health and/or Office of Mental Retardation Developmental Disability or designee. ____________________________________________        _______________________________________________Print date, name and title of qualified SCREENER                 SCREENER Identification Number (Assigned by [place])__________________________________________________________Signature of qualified SCREENER

## 2021-12-23 NOTE — Discharge Summary
Hospitalization Discharge SummaryPatient Data:  Patient Name: Keith Strickland Age: 88 y.o. DOB: [birth year]	 MRN: [id number]	 Admit date: 2/15/2023Discharge date: 2/21/23Discharge Attending Physician: Marshell Levan, MD  PCP: Lovena Le, MDPrincipal Diagnosis: Pneumonia of both lungs due to infectious organism, unspecified part of lungOther Diagnosis: Episode of Care Diagnosis/Problem List  Diagnosis ? Pneumonia of both lungs due to infectious organism, unspecified part of lung [J18.9] ? Hypernatremia [E87.0] ? Pneumonia due to infectious organism, unspecified laterality, unspecified part of lung [J18.9] ? AKI (acute kidney injury) (HC Code) (HC CODE) (HC Code) [N17.9] Discharged Condition: fairDisposition: Skilled Holiday representative for Short Term Rehab. Call made to SNF Provider by Care Team YesAllergies Allergies Allergen Reactions ? Gluten Protein Other (See Comments)   intolerance ? Lactose Other (See Comments)   intolerance ? Avelox [Moxifloxacin]  ? Ciprofloxacin  ? Penicillins  ? Sulfa (Sulfonamide Antibiotics)   Hospital Course: Patient is 88 year old male with past medical history of hypertension, atrial fibrillation, type 2 diabetes (diet controlled), dementia, glaucoma, macular degeneration, and BPH who presented to [facility] from [place] for fevers, tachycardia, and hypoxia.  Patient had had no recent sick contacts it was found to be altered, thus history was limited.  Vitals were significant for tachycardia of 100, afebrile at 98.8, and tachypneic at 40 upon admission.  Patient required 2 L of oxygen on admission.  Initial lab work was significant for a sodium of 148, creatinine 1.56, glucose 162, proBNP 13,796, lactate 2.7, and WBC of 11.9.  ABG was unremarkable.  Urine was cloudy with 4+ leukocytes, rare bacteria, and white blood cells.  X-ray showed opacities of the left mid and right lower lungs concerning for pneumonia with a moderate pleural effusion.  Patient was admitted for concerns of acute bacterial pneumonia.# Bacterial Pneumonia# Toxic Metabolic EncephalopathyLabs on admission were significant for infection with procalcitonin elevated at 4.27.  Chest x-ray noted opacities of the left mid and right lower lung zones concerning for pneumonia.  ABG was unremarkable.  Patient was started on ceftriaxone and azithromycin.  Azithromycin was completed after 5 days and Ceftriaxone was continued for a 7 day course. With antibiotics, inflammatory markers trended down.  Blood cultures showed no growth.  Patient was initially requiring oxygen, which was slowly titrated down.  Additionally, there was concerns for aspiration given the patient was altered when presenting, likely secondary to his pneumonia.  However, the patient was eventually able to work with SLP and was started on a dysphagia diet with pureed food and nectar thickened liquids.# AKI# HypernatremiaOn admission patient was noted to have an AKI with an elevated creatinine of 1.56 as well as hypernatremia at 148.  Renal ultrasound showed normal kidneys with question of UTI versus blood bladder.  With fluids, kidney function and sodium levels gradually improved.  At times, patient was noted to be retaining urine, which is chronic for the patient. This should be continued to be addressed at rehab and with his PCP. # Asymptomatic BacteriuriaPatient was noted to have a urine culture that grew 50000-99000 Proteus mirabilis.  Patient no noted symptoms of UTI, though this was difficult to discern.  Given the above sensitivity to his current pneumonia treatment, further antibiotics were not required.The patient was deemed stable for discharge and transferred to STR on 12/23/2021. Pertinent lab findings and test results: Recent Labs Lab 02/19/230615 02/20/230540 02/21/230548 WBC 9.3 10.4 12.2* HGB 13.6 12.4* 12.0* HCT 42.60 37.80* 37.20* PLT 193 184 194  Recent Labs Lab 02/19/230615 02/20/230540 02/21/230548 NEUTROPHILS 66.8 64.5 72.6*  Recent  Labs Lab 02/19/230615 02/19/230712 02/20/230540 02/20/231131 02/21/230548 NA 149*  --  145  --  140 K 4.4  --  3.5  --  3.1* CL 119*  --  114  --  107 CO2 23  --  28  --  28 BUN 30*  --  23  --  14 CREATININE 0.96  --  0.86  --  0.85 GLU 112*   < > 107*   < > 108* ANIONGAP 7  --  3*  --  5  < > = values in this interval not displayed.  Recent Labs Lab 02/19/230615 02/20/230540 02/21/230548 CALCIUM 8.4 8.1* 7.8* MG  --   --  1.7  Recent Labs Lab 02/15/230920 02/21/230548 ALT 16 21 AST 10 20 ALKPHOS 57 55 BILITOT 0.7 0.5  Recent Labs Lab 02/15/230920 LABPROT 10.2 INR 0.92  XR Chest PA or APResult Date: 2/15/2023Limited AP view- 1. Opacities the left mid and right lower lung zones concerning for pneumonia in the setting of sepsis. 2. Moderate left pleural effusion. [facility] Radiology Notify System Classification: Orange - Urgent without Follow-up Reported and Signed by:  Lonia Chimera, MD US RenalResult Date: 2/15/2023Normal-appearing kidneys. Thick-walled trabeculated urinary bladder with layering internal debris which may represent urinary tract infection or blood products. Correlate clinically. Small left pleural effusion. Reported and Signed by:  Antonieta Iba, MD Discharge vitals: Blood pressure 139/88, pulse 81, temperature 97.4 ?F (36.3 ?C), temperature source Oral, resp. rate 18, height 6' 1 (1.854 m), weight 67 kg, SpO2 (!) 90 %.Discharge Physical Exam:Gen:?Awake, NADEyes:? EOMI, anicteric scleraENMT:?MMM, Sparse dentition.?Neck: Supple, no JVDCV: nl S1 and S2, Normocardic, Irregularly Irregular, no m/r/gPulm:?CTAB, no wheezes, rales or ronchi. Breathing comfortably. Poor Breathing Effort.?GI:?Soft, NT, ND, +BS. No guarding or rebound tenderness. Ext:?No peripheral edema, WWP. Neuro: A&O x?1,?EOM intact, face symmetric, Responsive to name.?Psychiatric:?Normal mood and affectSkin:?No rashesPending Labs and Tests: ISSUES TO BE ADDRESSED POST DISCHARGE: 1. Recheck labs in 1 week (CBC and BMP)2. Continue to work with speech and language pathologists at rehab to advance diet further. Encourage good oral intake. 3. Patient has a history of urinary retention, which should continued to be monitored at [facility]. Discharge Medications: Current Discharge Medication List  CONTINUE these medications which have NOT CHANGED  Details acetaminophen (TYLENOL) 325 mg tablet Take 2 tablets (650 mg total) by mouth every 6 (six) hours as needed (Pain/Fever).Qty: 100 tablet, Refills: 5  azelastine (OPTIVAR) 0.05 % ophthalmic solution Place 1 drop into both eyes daily.Qty: 6 mL, Refills: 5  cholecalciferol, vitamin D3, 125 mcg (5,000 unit) capsule Take 1 capsule (5,000 Units total) by mouth. In the morning every Tuesday and Friday.  escitalopram oxalate (LEXAPRO) 10 mg tablet Take 1 tablet (10 mg total) by mouth at bedtime.  finasteride (PROSCAR) 5 mg tablet Take 1 tablet (5 mg total) by mouth daily.Qty: 30 tablet, Refills: 5  latanoprost (XALATAN) 0.005 % ophthalmic solution Place 1 drop into both eyes nightly.  levothyroxine (SYNTHROID, LEVOTHROID) 150 mcg tablet Take 1 tablet (150 mcg total) by mouth every morning. On an empty stomach, at least 30 minutes before breakfast.  magnesium hydroxide (MILK OF MAGNESIA) 400 mg/5 mL suspension Take 30 mLs by mouth daily as needed for constipation.  melatonin 1 mg tablet Take 1 tablet (1 mg total) by mouth nightly.  metoprolol succinate XL (TOPROL-XL) 25 mg 24 hr tablet Take 0.5 tablets (12.5 mg total) by mouth daily. Take with or immediately following a meal.Qty: 15 tablet, Refills: 5  OLANZapine (ZYPREXA) 2.5 mg tablet Take 1  tablet (2.5 mg total) by mouth nightly.Qty: 30 tablet, Refills: 5  omeprazole (PRILOSEC) 20 mg capsule Take 1 capsule (20 mg total) by mouth 2 times daily (0900, 1700).Qty: 30 capsule, Refills: 5  polyethylene glycol (MIRALAX) 17 gram packet Take 1 packet (17 g total) by mouth daily. Mix in 8 ounces of fluidQty: 30 each, Refills: 5  pravastatin (PRAVACHOL) 20 mg tablet Take 1 tablet (20 mg total) by mouth daily with dinner.Qty: 30 tablet, Refills: 5  propylene glycoL (SYSTANE BALANCE) 0.6 % ophthalmic solution Place 1 drop into both eyes 3 (three) times daily.  tamsulosin (FLOMAX) 0.4 mg 24 hr capsule Take 1 capsule (0.4 mg total) by mouth 2 (two) times daily.  timolol (TIMOPTIC) 0.5 % ophthalmic solution Place 1 drop into both eyes daily.Qty: 10 mL, Refills: 5   STOP taking these medications   doxycycline hyclate (VIBRAMYCIN) 100 mg capsule    .Hemaglobin A1c   Hemaglobin A1C Latest Ref Rng & Units 12/17/2021  BKR HEMOGLOBIN A1C See Comment % 5.9  Follow-up Information:Lovena Le, MD1 [address] [place] [phone] an appointment as soon as possible for a visit in 2 day(s)Post-Hospitalization Follow-up after treatment for pneumonia.[facility] [place] [phone] PMH PSH Past Medical History: Diagnosis Date ? Anxiety  ? Closed displaced fracture of left femoral neck (HC Code)  ? GERD (gastroesophageal reflux disease)  ? Glaucoma  ? Hyperlipidemia  ? Hypertension  ? Hypothyroidism  ? IBS (irritable bowel syndrome)  ? Kyphosis  ? Lumbosacral radiculopathy  ? Major depression  ? Osteoarthritis  ? Paroxysmal atrial fibrillation (HC Code) (HC CODE) (HC Code)  ? Pneumonia  ? Vitamin deficiency   Past Surgical History: Procedure Laterality Date ? FRACTURE SURGERY    Social History Family History Social History Tobacco Use ? Smoking status: Never ? Smokeless tobacco: Never Substance Use Topics ? Alcohol use: No  Family History Family history unknown: Yes  Electronically Signed:Ramesh Moan Inez [place], PGY- 2Date: 2/21/2023Time: 8:53 AMPager #: Heartbeat #:  [phone] : NONE

## 2021-12-23 NOTE — Plan of Care
Plan of Care Overview/ Patient Status    Dual RN bedside handoff completedReceived pt from outgoing RN. Pt is A&O x self. RA, Lung sounds diminished at bases. RTM in place w/ pulse ox, visible on monitor. Abdomen is soft, non tender. Last BM 2/20. Dysphagia diet maintained, suction at bedside. Incontinent of Bowel and bladder. Pt voiding into small amounts into diaper, retaining urine. Wound to lower back, mepliex drsg in place. no blanchable redness to scrotum and sacrum, barrier cream applied. Pt turned and repositioned, assist x2, Not OOB on shift, ambulated with PT during AM. Call bell and essentials within reach. Bed alarm active. Hourly rounding continues. 2000: Bladder Scan: 315.2115: Scheduled meds given, crushed w/ applesauce. 0000: pt voided small amount into diaper, incontinence care provided PVR: 750. 0030: straight cath performed, output: 600 mL. MD notified. 0200: Pt observed sleeping. 0300: Pt repositioned in bed, 0520: Scheduled meds given, no distress noted.

## 2021-12-23 NOTE — Plan of Care
Plan of Care Overview/ Patient Status    SOCIAL WORK ASSESSMENTPatient Name: Keith Strickland Record Number: [id number] Date of Birth: June 10, 1920Medical Social Work Assessment Adult  Flowsheet Row Most Recent Value Admission Information  Document Type Clinical Assessment - Able to Assess (For Inpatient/ED Only) Prior psychosocial assessment has been documented within this hospitalization No (For Inpatient/ED Only) Prior psychosocial assessment has been documented within 30 days of this hospitalization No Reason for Current Social Work Involvement Support/Coping  [PRI Screen] Source of Information Family/Caregiver Record Reviewed Yes Level of Care Inpatient Assessment has been completed within 30 days of this encounter  (For Inpatient/ED Only) Prior psychosocial assessment has been documented within 30 days of this hospitalization No Relationships  Marital Status Widowed Lives With Other (see comments)  [long term facility] Family circumstances emotionally supportive by friends Abuse Screen (yes response referral indicated)  Able to respond to abuse questions No Feels Unsafe at Home or Work/School unable to answer (comment required) Feels Threatened by Someone unable to answer (comment required) Does Anyone Try to Keep You From Having Contact with Others or Doing Things Outside Your Home? unable to answer (comment required) Safety Plan/Comments pt nonverbal at this time Language needed None, Patient Speaks English Literacy Unable to assess Special Needs unable to identify Social Determinants of Health  Financial Concerns None What is your living situation today? I have a steady place to live Think about the place you live. Do you have problems with any of the following? None How hard is it for you to pay for the very basics like food, housing, medical care, and heating? Not hard Within the past 12 months, you worried that your food would run out before you got the money to buy more. Never true Within the past 12 months, the food you bought just didn't last and you didn't have money to get more. Never true In the past 12 months, has lack of transportation kept you from medical appointments or from getting medications? no In the past 12 months, has lack of transportation kept you from meetings, work, or from getting things needed for daily living? No Mental Status  Mental Status Unable to Assess [history of anxiety] Suicide Risk Assessment  Reason for Assessment Utilizing SAFE-T and C-SSRS (Check all that apply) Social Work Consult/Assessment C-SSRS Unable to Assess Specific Questions about Thoughts, Plans, Suicidal Intent (SAFE-T) Unable to Assess Risk Assessment  Access to Lethal Methods?  (firearm in home or access/presence of other lethal methods) Unable to assess Risk Assessment Unable to Assess This patient was screened using the [place] Suicide Severity Rating Scale (CSSRS)  No Cause for concern Unable to Assess Substance Use  Active substance use No  Medically Ready for Discharge  Is this patient medically ready for discharge? Yes, no barriers Expected Discharge Date 12/24/21 Needs Assessment   Concerns to be Addressed no needs identified Readmission Within the Last 30 Days no previous admission in last 30 days Needs in the Community none Anticipated Facility/Agency/Outpatient/Support Group Need(s)  short term rehabilitation at a skilled nursing facility Home Health Care Services Required N/A Equipment Needed After Discharge none Discharge Plan  Expected Discharge Date 12/24/21 Formulation: Recommendation(s) and Intervention(s) (including for discharge to occur)  Psychosocial issues requiring intervention Support/coping and screening for [facility] Psychosocial interventions 10 minutes spent via telephone with Ms. [name] for Social Work assessment due to support/coping and required screening for PRI. Ms. Linton Rump was able to confirm demographic information and verify emergency contact list. [name] verbalized being in agreement with discharge disposition.  She asked if facility could be reminded of [place] being a fall risk, I passed along information to Case Management and validated her concerns. Screen faxed and receipt confirmed to [facility]. I thanked her for her time and the information provided. [facility] Specific referrals to enhance community supports (include existing and new resources) None at this time. Handoff Required? No Next Steps/Plan (including hand-off): No further social work interventions at this time. Please re consult if any need arise. Signature: Jolyn Lent LCSW Contact Information: [phone]  Cristy Folks Social WorkerGreenwich Carolinas Healthcare System [place] # [phone] Phone # [phone]

## 2021-12-23 NOTE — Plan of Care
Plan of Care Overview/ Patient Status    CM spoke with patient's daughter to notify that pt is going to be discharged to [place] at [facility]. BA asked to book transportation. Right to appeal discussed with daughter IM explained. No intention to appeal. Caron Presume. Thalia Bloodgood RN BSN [employer] ManagerGreenwich Hospital[address]  [place],  [place] [address]GEXBMW: [phone] MHB:  [phone]

## 2021-12-23 NOTE — Plan of Care
Plan of Care Overview/ Patient Status    PT GOALS1. Patient will perform bed mobility with minimal assist2. Patient will perform transfers with minimal assist using appropriate assistive device 3. Patient will ambulate a minimum of 50 feet with minimal assist using appropriate assistive device Physical Therapy Progress Note Patient Overview - 12/23/21 1433    Date of Visit / Treatment  Date of Visit / Treatment 12/23/21   Note Type Progress Note   Start Time 1315   Total Treatment Time 20 min    Patient Overview  History of Present Illness 88 yo male, resident at [facility], with PMH of AFib (not on A/C), DM (not on meds), HTN, BPH, anxiety, hypothyroidism, cavernous malformation, small traumatic intraparenchymal hemorrhage in 2019, memory loss, gait abnormality. Presented from home with lethargy, tachypnea, hypoxemia. Seems to have bilateral pneumonia.   Precautions fall   Social History other (comment)   ALF 24/7 HHA  Social History - Additional Details/Comments Pt lives at [facility]. Pt has private 24/7 aide at baseline and requires assist with mobility and ADLs w/ RW and w/c   Prior Level of Function assist with mobility;assist with ADLs   Subjective Ok   General Observations received in recliner, NAD, agreeable to PT, private aide present      Assessment - 12/23/21 1433    Assessment  Cognition (Mentation/Communication) alert   Cognition - Additional Details/Comments O x 1   Pain Rating 0   Pain Location - Additional Details/Comments denies any pain   Skin Integrity/Edema see skin documentation   Sensation no complaints   Range of Motion within functional limits as demonstrated with mobility/function   Muscle Strength/Tone at least 3/5 as demonstrated with mobility/function   Muscle Strength/Tone - Additional Details/Comments was able to walk a bit in the room  without buckling   Balance other (comment)   needs A of 2 with RW  Balance - Additional Details/Comments Poor with RW with A of 2      Functional Mobility - 12/23/21 1433    Functional Mobility  Sit to/from Stand Minimum assist;Assist of 2;Verbal cues   Sit to/from Stand Device Rolling walker   Ambulation Minimum assist;Assist of 2;Verbal cues   Ambulation Device Rolling walker   Ambulation Distance 10 feet   Overall Functional Mobility Comments Pt able to perform sit<>stand with Min A x 2 with RW. Pt with poor balance and kyphotic posture; Cues to keep eyes open during gait. Tolerated seated therex.      AMPAC Basic Mobility - 12/23/21 1433    PT- AM-PAC - Basic Mobility Screen- How much help from another person do you currently need.....  Turning from your back to your side while in a a flat bed without using rails? 2 - A Lot - Requires a lot of help (maximum to moderate assistance). Can use assistive devices.   Moving from lying on your back to sitting on the side of a flat bed without using bed rails? 2 - A Lot - Requires a lot of help (maximum to moderate assistance). Can use assistive devices.   Moving to and from a bed to a chair (including a wheelchair)? 2 - A Lot - Requires a lot of help (maximum to moderate assistance). Can use assistive devices.   Standing up from a chair using your arms(e.g., wheelchair or bedside chair)? 2 - A Lot - Requires a lot of help (maximum to moderate assistance). Can use assistive devices.   To walk in a hospital room?  2 - A Lot - Requires a lot of help (maximum to moderate assistance). Can use assistive devices.   Climbing 3-5 steps with a railing? 2 - A Lot - Requires a lot of help (maximum to moderate assistance). Can use assistive devices.   AMPAC Mobility Score 12   TARGET Highest Level of Mobility Mobility Level 4, Transfer to chair      Recommendations for IP Admission - 12/23/21 1433    PT Recommendations for Inpatient Admission Activity/Level of Assist out of bed;transfers only;assist of 2;minimum assistance;with rolling walker   ADL Recommendations bedside commode;assist of 2;minimum assistance;with rolling walker   Therapeutic Exercise encourage exercise program issued   Other/Comments OOB Daily A of 2 with RW       Clinical Impression/Recommendation - 12/23/21 1433    Clinical Impression / Recommendation  Initial Assessment Pt is 88 yo M admitted with PNA. Pt received in recliner, private aide present. Pt able to perform sit<>stand with Min A x 2 with RW. Pt with poor balance and kyphotic posture; Cues to keep eyes open during transfer and gait .Was able to walk a bit with A of 2 with RW, limited by fatigue, weakness and decrease activity tolerance. Pt and private aide educated about the importance of OOB mobility . Left in recliner with NAD, Notified RN. Recommend STR when medically   Patient Goal return to prior level of function   PT Frequency 3x per week   Physical Therapy Disposition Recommendation Short Term Rehab   Equipment Recommendations for Discharge Patient has all necessary durable medical equipment      PT Handoff - 12/23/21 1433    Handoff Documentation  Handoff Patient in chair;Chair alarm;Patient instructed to call nursing for mobility;Discussed with nursing     Gabriel Carina, PTPhysical Medicine DepartmentGreenwich Satanta District Hospital

## 2021-12-23 NOTE — Discharge Instructions
During this admission you were cared for by Dr Marshell Levan, MD and the Internal Medicine Resident team. You were admitted for Pneumonia of both lungs due to infectious organism, unspecified part of lung for which you were treated with IV antibiotics. You are now improved and medically stable for discharge. Please do the following things: Continue to take your medications as prescribedGet repeat lab work in 1 week (CBC and BMP)Continue to work with speech and Solicitor at rehab to advance diet further.  Continued to have good oral intake.Things to watch out for after discharge: You should monitor yourself for shortness of breath, fevers, chest pain, or confusion. Call your doctor if you experience any of the above symptoms. If you experience any of the above symptoms, please do not hesitate to return to our emergency department for re-evaluation.Medications:-Take your medicines as prescribed.  Recommended diet:Modified consistency diet (Pureed): blenderized foods, requires no chewing with nectar-thickened liquidsRecommended activity:Activity as tolerated IV Sites - notify your doctor for any extreme redness or observed drainage from any old IV site. Follow up:Lovena Le, MD[address] [phone] an appointment as soon as possible for a visit in 2 day(s)Post-Hospitalization Follow-up after treatment for pneumonia.[facility] [place] [phone] In general, it is best to be seen by your primary care physician within one to two weeks from a hospital discharge.Should you have any questions regarding your hospital stay, please call [phone]. Best of luck!

## 2021-12-25 ENCOUNTER — Encounter: Admit: 2021-12-25 | Payer: PRIVATE HEALTH INSURANCE | Primary: Internal Medicine

## 2021-12-26 ENCOUNTER — Other Ambulatory Visit: Admit: 2021-12-26 | Payer: PRIVATE HEALTH INSURANCE | Attending: Family Medicine | Primary: Internal Medicine

## 2021-12-26 DIAGNOSIS — Z7689 Persons encountering health services in other specified circumstances: Secondary | ICD-10-CM

## 2021-12-26 LAB — URINALYSIS-MACROSCOPIC W/REFLEX MICROSCOPIC
BKR BILIRUBIN, UA: NEGATIVE
BKR GLUCOSE, UA: NEGATIVE
BKR KETONES, UA: NEGATIVE
BKR NITRITE, UA: NEGATIVE
BKR PH, UA: 6.5 (ref 5.5–7.5)
BKR SPECIFIC GRAVITY, UA: 1.015 (ref 1.005–1.030)
BKR UROBILINOGEN, UA (MG/DL): <2 mg/dL (ref <=2.0)

## 2021-12-26 LAB — URINE MICROSCOPIC     (BH GH LMW YH)
BKR RBC/HPF INSTRUMENT: 7 /HPF — ABNORMAL HIGH (ref 0–2)
BKR URINE SQUAMOUS EPITHELIAL CELLS, UA (NUMERIC): 1 /HPF (ref 0–5)
BKR WBC/HPF INSTRUMENT: 55 /HPF — ABNORMAL HIGH (ref 0–5)

## 2021-12-27 LAB — URINE CULTURE: BKR URINE CULTURE, ROUTINE: NO GROWTH

## 2021-12-29 ENCOUNTER — Encounter: Admit: 2021-12-29 | Payer: PRIVATE HEALTH INSURANCE | Attending: Internal Medicine | Primary: Internal Medicine

## 2021-12-29 DIAGNOSIS — Z049 Encounter for examination and observation for unspecified reason: Secondary | ICD-10-CM

## 2021-12-30 ENCOUNTER — Inpatient Hospital Stay: Admit: 2021-12-30 | Discharge: 2021-12-30 | Payer: PRIVATE HEALTH INSURANCE | Primary: Internal Medicine

## 2021-12-30 LAB — CBC WITH AUTO DIFFERENTIAL
BKR WAM ABSOLUTE IMMATURE GRANULOCYTES.: 0.05 x 1000/ÂµL (ref 0.00–0.30)
BKR WAM ABSOLUTE LYMPHOCYTE COUNT.: 1.58 x 1000/??L (ref 0.60–3.70)
BKR WAM ABSOLUTE NRBC (2 DEC): 0 x 1000/ÂµL (ref 0.00–1.00)
BKR WAM ANALYZER ANC: 7.25 x 1000/ÂµL (ref 2.00–7.60)
BKR WAM BASOPHIL ABSOLUTE COUNT.: 0.03 x 1000/ÂµL (ref 0.00–1.00)
BKR WAM BASOPHILS: 0.3 % (ref 0.0–1.4)
BKR WAM EOSINOPHIL ABSOLUTE COUNT.: 0.03 x 1000/ÂµL (ref 0.00–1.00)
BKR WAM EOSINOPHILS: 0.3 % (ref 0.0–5.0)
BKR WAM HEMATOCRIT (2 DEC): 37.3 % — ABNORMAL LOW (ref 38.50–50.00)
BKR WAM HEMOGLOBIN: 11.6 g/dL — ABNORMAL LOW (ref 13.2–17.1)
BKR WAM IMMATURE GRANULOCYTES: 0.5 % (ref 0.0–1.0)
BKR WAM LYMPHOCYTES: 15.7 % — ABNORMAL LOW (ref 17.0–50.0)
BKR WAM MCH (PG): 29.6 pg (ref 27.0–33.0)
BKR WAM MCHC: 31.1 g/dL (ref 31.0–36.0)
BKR WAM MCV: 95.2 fL (ref 80.0–100.0)
BKR WAM MONOCYTE ABSOLUTE COUNT.: 1.13 x 1000/ÂµL — ABNORMAL HIGH (ref 0.00–1.00)
BKR WAM MONOCYTES: 11.2 % (ref 4.0–12.0)
BKR WAM MPV: 9.9 fL (ref 8.0–12.0)
BKR WAM NEUTROPHILS: 72 % (ref 39.0–72.0)
BKR WAM NUCLEATED RED BLOOD CELLS: 0 % (ref 0.0–1.0)
BKR WAM PLATELETS: 287 x1000/ÂµL (ref 150–420)
BKR WAM RDW-CV: 13.3 % (ref 11.0–15.0)
BKR WAM RED BLOOD CELL COUNT.: 3.92 M/ÂµL — ABNORMAL LOW (ref 4.00–6.00)
BKR WAM WHITE BLOOD CELL COUNT: 10.1 x1000/ÂµL (ref 4.0–11.0)

## 2021-12-30 LAB — BASIC METABOLIC PANEL
BKR ANION GAP: 6 (ref 5–18)
BKR BLOOD UREA NITROGEN: 23 mg/dL (ref 8–25)
BKR BUN / CREAT RATIO: 24.2 (ref 8.0–25.0)
BKR CALCIUM: 8 mg/dL — ABNORMAL LOW (ref 8.4–10.3)
BKR CHLORIDE: 108 mmol/L (ref 95–115)
BKR CO2: 27 mmol/L (ref 21–32)
BKR CREATININE: 0.95 mg/dL (ref 0.50–1.30)
BKR EGFR, CREATININE (CKD-EPI 2021): >60 mL/min/{1.73_m2} (ref >=60)
BKR GLUCOSE: 137 mg/dL — ABNORMAL HIGH (ref 70–100)
BKR OSMOLALITY CALCULATION: 287 mosm/kg (ref 275–295)
BKR POTASSIUM: 4.2 mmol/L (ref 3.5–5.1)
BKR SODIUM: 141 mmol/L (ref 136–145)

## 2022-01-03 ENCOUNTER — Encounter: Admit: 2022-01-03 | Payer: PRIVATE HEALTH INSURANCE | Attending: Family Medicine | Primary: Internal Medicine

## 2022-01-03 ENCOUNTER — Emergency Department: Admit: 2022-01-03 | Payer: MEDICARE | Primary: Internal Medicine

## 2022-01-03 ENCOUNTER — Inpatient Hospital Stay
Admit: 2022-01-03 | Discharge: 2022-01-08 | Payer: MEDICARE | Attending: Internal Medicine | Admitting: Internal Medicine

## 2022-01-03 DIAGNOSIS — E785 Hyperlipidemia, unspecified: Secondary | ICD-10-CM

## 2022-01-03 DIAGNOSIS — M40209 Unspecified kyphosis, site unspecified: Secondary | ICD-10-CM

## 2022-01-03 DIAGNOSIS — E875 Hyperkalemia: Secondary | ICD-10-CM

## 2022-01-03 DIAGNOSIS — J189 Pneumonia, unspecified organism: Secondary | ICD-10-CM

## 2022-01-03 DIAGNOSIS — F419 Anxiety disorder, unspecified: Secondary | ICD-10-CM

## 2022-01-03 DIAGNOSIS — M5417 Radiculopathy, lumbosacral region: Secondary | ICD-10-CM

## 2022-01-03 DIAGNOSIS — K219 Gastro-esophageal reflux disease without esophagitis: Secondary | ICD-10-CM

## 2022-01-03 DIAGNOSIS — M1990 Unspecified osteoarthritis, unspecified site: Secondary | ICD-10-CM

## 2022-01-03 DIAGNOSIS — H409 Unspecified glaucoma: Secondary | ICD-10-CM

## 2022-01-03 DIAGNOSIS — S72002A Fracture of unspecified part of neck of left femur, initial encounter for closed fracture: Secondary | ICD-10-CM

## 2022-01-03 DIAGNOSIS — I480 Paroxysmal atrial fibrillation: Secondary | ICD-10-CM

## 2022-01-03 DIAGNOSIS — F329 Major depressive disorder, single episode, unspecified: Secondary | ICD-10-CM

## 2022-01-03 DIAGNOSIS — I10 Essential (primary) hypertension: Secondary | ICD-10-CM

## 2022-01-03 DIAGNOSIS — K589 Irritable bowel syndrome without diarrhea: Secondary | ICD-10-CM

## 2022-01-03 DIAGNOSIS — E039 Hypothyroidism, unspecified: Secondary | ICD-10-CM

## 2022-01-03 DIAGNOSIS — E569 Vitamin deficiency, unspecified: Secondary | ICD-10-CM

## 2022-01-03 MED ORDER — MEROPENEM 1GM MBP
Freq: Once | INTRAVENOUS | Status: CP
Start: 2022-01-03 — End: ?
  Administered 2022-01-04: 05:00:00 100.000 mL/h via INTRAVENOUS

## 2022-01-03 MED ORDER — DEXTROSE 15 GRAM/60 ML ORAL LIQUID
1560 gram/60 mL | ORAL | Status: DC | PRN
Start: 2022-01-03 — End: 2022-01-07

## 2022-01-03 MED ORDER — SODIUM CHLORIDE 0.9 % BOLUS (NEW BAG)
0.9 % | Freq: Once | INTRAVENOUS | Status: CP
Start: 2022-01-03 — End: ?
  Administered 2022-01-04: 01:00:00 0.9 mL/h via INTRAVENOUS

## 2022-01-03 MED ORDER — INSULIN LISPRO 100 UNIT/ML (SLIDING SCALE)
100 unit/mL | Freq: Four times a day (QID) | SUBCUTANEOUS | Status: DC
Start: 2022-01-03 — End: 2022-01-07

## 2022-01-03 MED ORDER — SKIM MILK
ORAL | Status: DC | PRN
Start: 2022-01-03 — End: 2022-01-07

## 2022-01-03 MED ORDER — DEXTROSE 10 % IV BOLUS FOR ORDERABLE
INTRAVENOUS | Status: DC | PRN
Start: 2022-01-03 — End: 2022-01-04

## 2022-01-03 MED ORDER — VANCOMYCIN 1 G IN 250 ML IVPB (VIALMATE)
INTRAVENOUS | Status: DC
Start: 2022-01-03 — End: 2022-01-04
  Administered 2022-01-04: 06:00:00 250.000 mL/h via INTRAVENOUS

## 2022-01-03 MED ORDER — DEXTROSE 10 % IV BOLUS FOR ORDERABLE
INTRAVENOUS | Status: DC | PRN
Start: 2022-01-03 — End: 2022-01-07

## 2022-01-03 MED ORDER — SODIUM CHLORIDE 0.9 % (FLUSH) INJECTION SYRINGE
0.9 % | Freq: Three times a day (TID) | INTRAVENOUS | Status: DC
Start: 2022-01-03 — End: 2022-01-08
  Administered 2022-01-04 – 2022-01-07 (×6): 0.9 mL via INTRAVENOUS

## 2022-01-03 MED ORDER — MEROPENEM 1GM MBP
Freq: Two times a day (BID) | INTRAVENOUS | Status: DC
Start: 2022-01-03 — End: 2022-01-04

## 2022-01-03 MED ORDER — ACETAMINOPHEN 650 MG RECTAL SUPPOSITORY
650 mg | Freq: Once | RECTAL | Status: CP
Start: 2022-01-03 — End: ?
  Administered 2022-01-04: 01:00:00 650 mg via RECTAL

## 2022-01-03 MED ORDER — FRUIT JUICE
ORAL | Status: DC | PRN
Start: 2022-01-03 — End: 2022-01-07

## 2022-01-03 MED ORDER — GLUCAGON 1 MG/ML IN STERILE WATER
Freq: Once | INTRAMUSCULAR | Status: DC | PRN
Start: 2022-01-03 — End: 2022-01-07

## 2022-01-03 MED ORDER — LATANOPROST 0.005 % EYE DROPS
0.005 % | Freq: Every evening | OPHTHALMIC | Status: DC
Start: 2022-01-03 — End: 2022-01-08
  Administered 2022-01-05 – 2022-01-07 (×3): 0.005 mL via OPHTHALMIC

## 2022-01-03 MED ORDER — GLUCAGON 1 MG/ML IN STERILE WATER
Freq: Once | INTRAMUSCULAR | Status: DC | PRN
Start: 2022-01-03 — End: 2022-01-04

## 2022-01-03 MED ORDER — SODIUM CHLORIDE 0.9 % INTRAVENOUS SOLUTION
INTRAVENOUS | Status: DC
Start: 2022-01-03 — End: 2022-01-04
  Administered 2022-01-04: 09:00:00 via INTRAVENOUS

## 2022-01-03 MED ORDER — SODIUM CHLORIDE 0.9 % (FLUSH) INJECTION SYRINGE
0.9 % | INTRAVENOUS | Status: DC | PRN
Start: 2022-01-03 — End: 2022-01-08

## 2022-01-04 LAB — COMPREHENSIVE METABOLIC PANEL
BKR A/G RATIO: 0.5
BKR ALANINE AMINOTRANSFERASE (ALT): 18 U/L (ref 12–78)
BKR ALBUMIN: 2.2 g/dL — ABNORMAL LOW (ref 3.4–5.0)
BKR ALKALINE PHOSPHATASE: 62 U/L (ref 20–135)
BKR ANION GAP: 2 — ABNORMAL LOW (ref 5–18)
BKR ASPARTATE AMINOTRANSFERASE (AST): 37 U/L (ref 5–37)
BKR AST/ALT RATIO: 2.1
BKR BILIRUBIN TOTAL: 0.4 mg/dL (ref 0.0–1.0)
BKR BLOOD UREA NITROGEN: 27 mg/dL — ABNORMAL HIGH (ref 8–25)
BKR BUN / CREAT RATIO: 23.1 (ref 8.0–25.0)
BKR CALCIUM: 8.4 mg/dL (ref 8.4–10.3)
BKR CHLORIDE: 112 mmol/L (ref 95–115)
BKR CO2: 27 mmol/L (ref 21–32)
BKR CREATININE: 1.17 mg/dL (ref 0.50–1.30)
BKR EGFR, CREATININE (CKD-EPI 2021): 56 mL/min/{1.73_m2} — ABNORMAL LOW (ref >=60)
BKR GLOBULIN: 4.4 g/dL
BKR GLUCOSE: 158 mg/dL — ABNORMAL HIGH (ref 70–100)
BKR OSMOLALITY CALCULATION: 290 mosm/kg (ref 275–295)
BKR POTASSIUM: 5.8 mmol/L — ABNORMAL HIGH (ref 3.5–5.1)
BKR PROTEIN TOTAL: 6.6 g/dL (ref 6.4–8.2)
BKR SODIUM: 141 mmol/L (ref 136–145)

## 2022-01-04 LAB — CBC WITH AUTO DIFFERENTIAL
BKR WAM ABSOLUTE IMMATURE GRANULOCYTES.: 0.06 x 1000/ÂµL (ref 0.00–0.30)
BKR WAM ABSOLUTE IMMATURE GRANULOCYTES.: 0.05 x 1000/ÂµL (ref 0.00–0.30)
BKR WAM ABSOLUTE LYMPHOCYTE COUNT.: 2.1 x 1000/ÂµL (ref 0.60–3.70)
BKR WAM ABSOLUTE LYMPHOCYTE COUNT.: 2.07 x 1000/ÂµL (ref 0.60–3.70)
BKR WAM ABSOLUTE NRBC (2 DEC): 0 x 1000/ÂµL (ref 0.00–1.00)
BKR WAM ABSOLUTE NRBC (2 DEC): 0 x 1000/ÂµL (ref 0.00–1.00)
BKR WAM ANALYZER ANC: 7.7 x 1000/ÂµL — ABNORMAL HIGH (ref 2.00–7.60)
BKR WAM ANALYZER ANC: 7.23 x 1000/ÂµL (ref 2.00–7.60)
BKR WAM BASOPHIL ABSOLUTE COUNT.: 0.01 x 1000/ÂµL (ref 0.00–1.00)
BKR WAM BASOPHIL ABSOLUTE COUNT.: 0.01 x 1000/ÂµL (ref 0.00–1.00)
BKR WAM BASOPHILS: 0.1 % (ref 0.0–1.4)
BKR WAM BASOPHILS: 0.1 % (ref 0.0–1.4)
BKR WAM EOSINOPHIL ABSOLUTE COUNT.: 0.01 x 1000/ÂµL (ref 0.00–1.00)
BKR WAM EOSINOPHIL ABSOLUTE COUNT.: 0.02 x 1000/ÂµL (ref 0.00–1.00)
BKR WAM EOSINOPHILS: 0.1 % (ref 0.0–5.0)
BKR WAM EOSINOPHILS: 0.2 % (ref 0.0–5.0)
BKR WAM HEMATOCRIT (2 DEC): 38.9 % (ref 38.50–50.00)
BKR WAM HEMATOCRIT (2 DEC): 34.2 % — ABNORMAL LOW (ref 38.50–50.00)
BKR WAM HEMOGLOBIN: 12.2 g/dL — ABNORMAL LOW (ref 13.2–17.1)
BKR WAM HEMOGLOBIN: 10.7 g/dL — ABNORMAL LOW (ref 13.2–17.1)
BKR WAM IMMATURE GRANULOCYTES: 0.6 % (ref 0.0–1.0)
BKR WAM IMMATURE GRANULOCYTES: 0.5 % (ref 0.0–1.0)
BKR WAM LYMPHOCYTES: 19.7 % (ref 17.0–50.0)
BKR WAM LYMPHOCYTES: 20.4 % (ref 17.0–50.0)
BKR WAM MCH (PG): 29.8 pg (ref 27.0–33.0)
BKR WAM MCH (PG): 30 pg (ref 27.0–33.0)
BKR WAM MCHC: 31.4 g/dL (ref 31.0–36.0)
BKR WAM MCHC: 31.3 g/dL (ref 31.0–36.0)
BKR WAM MCV: 94.9 fL (ref 80.0–100.0)
BKR WAM MCV: 95.8 fL (ref 80.0–100.0)
BKR WAM MONOCYTE ABSOLUTE COUNT.: 0.77 x 1000/ÂµL (ref 0.00–1.00)
BKR WAM MONOCYTE ABSOLUTE COUNT.: 0.77 x 1000/ÂµL (ref 0.00–1.00)
BKR WAM MONOCYTES: 7.2 % (ref 4.0–12.0)
BKR WAM MONOCYTES: 7.6 % (ref 4.0–12.0)
BKR WAM MPV: 9.8 fL (ref 8.0–12.0)
BKR WAM MPV: 9.5 fL (ref 8.0–12.0)
BKR WAM NEUTROPHILS: 72.3 % — ABNORMAL HIGH (ref 39.0–72.0)
BKR WAM NEUTROPHILS: 71.2 % (ref 39.0–72.0)
BKR WAM NUCLEATED RED BLOOD CELLS: 0 % (ref 0.0–1.0)
BKR WAM NUCLEATED RED BLOOD CELLS: 0 % (ref 0.0–1.0)
BKR WAM PLATELETS: 355 x1000/ÂµL (ref 150–420)
BKR WAM PLATELETS: 277 x1000/ÂµL (ref 150–420)
BKR WAM RDW-CV: 13.4 % (ref 11.0–15.0)
BKR WAM RDW-CV: 13.3 % (ref 11.0–15.0)
BKR WAM RED BLOOD CELL COUNT.: 4.1 M/ÂµL (ref 4.00–6.00)
BKR WAM RED BLOOD CELL COUNT.: 3.57 M/ÂµL — ABNORMAL LOW (ref 4.00–6.00)
BKR WAM WHITE BLOOD CELL COUNT: 10.7 x1000/ÂµL (ref 4.0–11.0)
BKR WAM WHITE BLOOD CELL COUNT: 10.2 x1000/ÂµL (ref 4.0–11.0)

## 2022-01-04 LAB — URINALYSIS WITH CULTURE REFLEX      (BH LMW YH)
BKR BILIRUBIN, UA: NEGATIVE
BKR BLOOD, UA: NEGATIVE
BKR GLUCOSE, UA: NEGATIVE
BKR KETONES, UA: NEGATIVE
BKR NITRITE, UA: NEGATIVE
BKR PH, UA: 6 (ref 5.5–7.5)
BKR SPECIFIC GRAVITY, UA: 1.018 (ref 1.005–1.030)
BKR UROBILINOGEN, UA (MG/DL): <2 mg/dL (ref <=2.0)

## 2022-01-04 LAB — BLOOD GAS, ARTERIAL     (GH)
BKR BASE EXCESS ARTERIAL: 0 mmol/L (ref -2–2)
BKR CARBOXYHEMOGLOBIN, ARTERIAL: 1.1 % (ref 0.5–1.5)
BKR FIO2: 50 %
BKR HCO3, ARTERIAL: 24 mmol/L (ref 20.0–26.0)
BKR METHEMOGLOBIN, ARTERIAL: 1.2 % (ref 0.0–1.5)
BKR O2 SATURATION, ARTERIAL: 98 % — ABNORMAL HIGH (ref 95–97)
BKR PATIENT TEMP: 37 Cel
BKR PCO2 ARTERIAL: 38 mmHg (ref 35–45)
BKR PH, ARTERIAL: 7.42 U (ref 7.35–7.45)
BKR PO2, ARTERIAL: 121 mmHg — ABNORMAL HIGH (ref 75–100)

## 2022-01-04 LAB — RESPIRATORY VIRUS PCR PANEL (W/O COVID/FLU/RSV) (BH GH LMW YH)
BKR ADENOVIRUS: NOT DETECTED
BKR HUMAN METAPNEUMOVIRUS (HMPV): NOT DETECTED
BKR PARAINFLUENZA VIRUS 1: NOT DETECTED
BKR PARAINFLUENZA VIRUS 2: NOT DETECTED
BKR PARAINFLUENZA VIRUS 3: NOT DETECTED
BKR PARAINFLUENZA VIRUS 4: NOT DETECTED
BKR RHINOVIRUS: NOT DETECTED

## 2022-01-04 LAB — NT-PROBNPE: BKR B-TYPE NATRIURETIC PEPTIDE, PRO (PROBNP): 9175 pg/mL — ABNORMAL HIGH (ref 0.0–1800.0)

## 2022-01-04 LAB — BASIC METABOLIC PANEL
BKR ANION GAP: 6 (ref 5–18)
BKR ANION GAP: 5 (ref 5–18)
BKR BLOOD UREA NITROGEN: 26 mg/dL — ABNORMAL HIGH (ref 8–25)
BKR BLOOD UREA NITROGEN: 24 mg/dL (ref 8–25)
BKR BUN / CREAT RATIO: 25 (ref 8.0–25.0)
BKR BUN / CREAT RATIO: 27.3 — ABNORMAL HIGH (ref 8.0–25.0)
BKR CALCIUM: 7.8 mg/dL — ABNORMAL LOW (ref 8.4–10.3)
BKR CALCIUM: 7.9 mg/dL — ABNORMAL LOW (ref 8.4–10.3)
BKR CHLORIDE: 115 mmol/L (ref 95–115)
BKR CHLORIDE: 116 mmol/L — ABNORMAL HIGH (ref 95–115)
BKR CO2: 25 mmol/L (ref 21–32)
BKR CO2: 26 mmol/L (ref 21–32)
BKR CREATININE: 1.04 mg/dL (ref 0.50–1.30)
BKR CREATININE: 0.88 mg/dL (ref 0.50–1.30)
BKR EGFR, CREATININE (CKD-EPI 2021): >60 mL/min/{1.73_m2} (ref >=60)
BKR EGFR, CREATININE (CKD-EPI 2021): >60 mL/min/{1.73_m2} (ref >=60)
BKR GLUCOSE: 133 mg/dL — ABNORMAL HIGH (ref 70–100)
BKR GLUCOSE: 110 mg/dL — ABNORMAL HIGH (ref 70–100)
BKR OSMOLALITY CALCULATION: 297 mosm/kg — ABNORMAL HIGH (ref 275–295)
BKR OSMOLALITY CALCULATION: 297 mosm/kg — ABNORMAL HIGH (ref 275–295)
BKR POTASSIUM: 4.6 mmol/L (ref 3.5–5.1)
BKR POTASSIUM: 4.3 mmol/L (ref 3.5–5.1)
BKR SODIUM: 146 mmol/L — ABNORMAL HIGH (ref 136–145)
BKR SODIUM: 147 mmol/L — ABNORMAL HIGH (ref 136–145)

## 2022-01-04 LAB — PROCALCITONIN     (BH GH LMW Q YH): BKR PROCALCITONIN: 0.13 ng/mL

## 2022-01-04 LAB — TROPONIN T HIGH SENSITIVITY, 1 HOUR WITH REFLEX (BH GH LMW YH)
BKR TROPONIN T HS 1 HOUR DELTA FROM 0 HOUR: -5 ng/L
BKR TROPONIN T HS 1 HOUR: 70 ng/L — ABNORMAL HIGH

## 2022-01-04 LAB — UA REFLEX CULTURE

## 2022-01-04 LAB — INFLUENZA A+B/RSV BY RT-PCR
BKR INFLUENZA A: NEGATIVE
BKR INFLUENZA B: NEGATIVE
BKR RESPIRATORY SYNCYTIAL VIRUS: NEGATIVE

## 2022-01-04 LAB — TROPONIN T HIGH SENSITIVITY, 3 HOUR (BH GH LMW YH)
BKR TROPONIN T HS 1 HOUR DELTA FROM 0 HOUR ON 3HR: -5 ng/L
BKR TROPONIN T HS 3 HOUR DELTA FROM 0 HOUR: -5 ng/L
BKR TROPONIN T HS 3 HOUR: 70 ng/L — ABNORMAL HIGH

## 2022-01-04 LAB — C-REACTIVE PROTEIN     (CRP): BKR C-REACTIVE PROTEIN: 12.5 mg/dL — ABNORMAL HIGH (ref 0.0–1.0)

## 2022-01-04 LAB — MAGNESIUM: BKR MAGNESIUM: 1.9 mg/dL (ref 1.4–2.2)

## 2022-01-04 LAB — SARS COV-2 (COVID-19) RNA: BKR SARS-COV-2 RNA (COVID-19) (YH): NEGATIVE

## 2022-01-04 LAB — URINE MICROSCOPIC     (BH GH LMW YH)
BKR RBC/HPF INSTRUMENT: 1 /HPF (ref 0–2)
BKR URINE SQUAMOUS EPITHELIAL CELLS, UA (NUMERIC): 1 /HPF (ref 0–5)
BKR WBC/HPF INSTRUMENT: 16 /HPF — ABNORMAL HIGH (ref 0–5)

## 2022-01-04 LAB — PROTIME AND INR
BKR INR: 0.99 (ref 0.89–1.14)
BKR PROTHROMBIN TIME: 10.9 s (ref 9.4–12.0)

## 2022-01-04 LAB — LACTIC ACID, PLASMA (REFLEX 2H REPEAT): BKR LACTATE: 1.7 mmol/L (ref 0.4–2.0)

## 2022-01-04 LAB — TROPONIN T HIGH SENSITIVITY, 0 HOUR BASELINE WITH REFLEX (BH GH LMW YH): BKR TROPONIN T HS 0 HOUR BASELINE: 75 ng/L — ABNORMAL HIGH

## 2022-01-04 LAB — PHOSPHORUS     (BH GH L LMW YH): BKR PHOSPHORUS: 3.2 mg/dL (ref 2.5–4.9)

## 2022-01-04 MED ORDER — TWOCAL HN 0.08 GRAM-2 KCAL/ML ORAL LIQUID
Freq: Every day | ORAL | Status: SS
Start: 2022-01-04 — End: 2022-01-08

## 2022-01-04 MED ORDER — METOPROLOL TARTRATE 5 MG/5 ML IV
55 mg/ mL | INTRAVENOUS | Status: DC | PRN
Start: 2022-01-04 — End: 2022-01-05

## 2022-01-04 MED ORDER — OLANZAPINE 5 MG TABLET
5 mg | Freq: Every evening | ORAL | Status: SS
Start: 2022-01-04 — End: 2022-01-08

## 2022-01-04 MED ORDER — DEXTROSE 5 % IN WATER (D5W) INTRAVENOUS SOLUTION
INTRAVENOUS | Status: DC
Start: 2022-01-04 — End: 2022-01-06
  Administered 2022-01-04 – 2022-01-06 (×2): 50.000 mL/h via INTRAVENOUS

## 2022-01-04 MED ORDER — ENOXAPARIN 40 MG/0.4 ML SUBCUTANEOUS SYRINGE
40 mg/0.4 mL | Freq: Every day | SUBCUTANEOUS | Status: SS
Start: 2022-01-04 — End: 2022-01-08

## 2022-01-04 MED ORDER — OMEPRAZOLE 20 MG TABLET,DELAYED RELEASE
20 mg | Freq: Every day | ORAL | Status: SS
Start: 2022-01-04 — End: 2022-01-08

## 2022-01-04 MED ORDER — LABETALOL 5 MG/ML INTRAVENOUS SOLUTION
5 mg/mL | Freq: Four times a day (QID) | INTRAVENOUS | Status: DC | PRN
Start: 2022-01-04 — End: 2022-01-05

## 2022-01-04 MED ORDER — MEROPENEM 1GM MBP
Freq: Two times a day (BID) | INTRAVENOUS | Status: DC
Start: 2022-01-04 — End: 2022-01-07
  Administered 2022-01-04 – 2022-01-07 (×6): 100.000 mL/h via INTRAVENOUS

## 2022-01-04 MED ORDER — VANCOMYCIN 1 G IN 250 ML IVPB (VIALMATE)
INTRAVENOUS | Status: DC
Start: 2022-01-04 — End: 2022-01-05
  Administered 2022-01-05: 17:00:00 250.000 mL/h via INTRAVENOUS

## 2022-01-04 MED ORDER — POLYETHYLENE GLYCOL 3350 17 GRAM ORAL POWDER PACKET
17 gram | Freq: Every day | ORAL | Status: SS | PRN
Start: 2022-01-04 — End: 2022-01-08

## 2022-01-04 MED ORDER — METOPROLOL TARTRATE 5 MG/5 ML IV
5 mg/ mL | INTRAVENOUS | Status: DC | PRN
Start: 2022-01-04 — End: 2022-01-05

## 2022-01-04 NOTE — Progress Notes
[facility] Progress NoteAttending Provider: Marshell Levan, MD Subjective                                                                              Subjective: Interim History: events noted; got lethargic with tachypnea and ws desaturating in [facility] and sent back to ED; placed on HFNC; given some IVF ad made NPO. Started on vanco and Meropenem to cover for healthcare associated pathogens although aspiration is the likely culprit; lethargic this am, opening eyes only when stimulatedReview of Allergies/Meds/Hx: Review of Allergies/Meds/Hx:I have reviewed the patient's: allergies and current scheduled medications Objective Objective: Vitals:Last 24 hours: Temp:  [97.5 ?F (36.4 ?C)-99.6 ?F (37.6 ?C)] 97.7 ?F (36.5 ?C)Pulse:  [70-130] 70Resp:  [18-43] 19BP: (93-171)/(41-89) 155/89SpO2:  [94 %-98 %] 97 %I/O's:Gross Totals (Last 24 hours) at 01/04/2022 1440Last data filed at 01/04/2022 1225Intake 1881.42 ml Output -- Net 1881.42 ml Procedures:Physical Exam Constitutional:     Appearance: elderly male; ill looking; on HF oxygen, mild tachypnea, somnoelentHENT:    Head: Normocephalic and atraumatic. Eyes:    General: No scleral icterus.Cardiovascular:    Rate and Rhythm: Normal rate. Pulmonary:    Comments: bila rhonchi notedAbdominal:    General: Bowel sounds are normal. There is no distension.    Palpations: Abdomen is soft.    Tenderness: There is no abdominal tenderness. Musculoskeletal:    Cervical back: Neck supple.    Right lower leg: No edema.    Left lower leg: No edema. Neurological:    Comments: altered, lethargic with some purposeless movements of arms; no answering back; has baseline moderate dementiaLabs:Last 24 hours: Recent Results (from the past 24 hour(s)) Blood culture-Collect Blood Cx prior to Antibiotics  Collection Time: 01/03/22  7:35 PM Specimen: Peripheral; Blood Result Value Ref Range  Blood Culture No Growth to Date  Lactic acid, plasma (sepsis, reflex 2h repeat)  Collection Time: 01/03/22  7:36 PM Result Value Ref Range  Lactate 1.7 0.4 - 2.0 mmol/L CBC auto differential  Collection Time: 01/03/22  7:36 PM Result Value Ref Range  WBC 10.7 4.0 - 11.0 x1000/?L  RBC 4.10 4.00 - 6.00 M/?L  Hemoglobin 12.2 (L) 13.2 - 17.1 g/dL  Hematocrit 09.81 19.14 - 50.00 %  MCV 94.9 80.0 - 100.0 fL  MCH 29.8 27.0 - 33.0 pg  MCHC 31.4 31.0 - 36.0 g/dL  RDW-CV 78.2 95.6 - 21.3 %  Platelets 355 150 - 420 x1000/?L  MPV 9.8 8.0 - 12.0 fL  Neutrophils 72.3 (H) 39.0 - 72.0 %  Lymphocytes 19.7 17.0 - 50.0 %  Monocytes 7.2 4.0 - 12.0 %  Eosinophils 0.1 0.0 - 5.0 %  Basophil 0.1 0.0 - 1.4 %  Immature Granulocytes 0.6 0.0 - 1.0 %  nRBC 0.0 0.0 - 1.0 %  ANC(Abs Neutrophil Count) 7.70 (H) 2.00 - 7.60 x 1000/?L  Absolute Lymphocyte Count 2.10 0.60 - 3.70 x 1000/?L  Monocyte Absolute Count 0.77 0.00 - 1.00 x 1000/?L  Eosinophil Absolute Count 0.01 0.00 - 1.00 x 1000/?L  Basophil Absolute Count 0.01 0.00 - 1.00 x 1000/?L  Absolute Immature Granulocyte Count 0.06 0.00 - 0.30 x 1000/?L  Absolute nRBC 0.00 0.00 - 1.00 x 1000/?L Comprehensive metabolic panel  Collection Time: 01/03/22  7:37 PM Result Value Ref Range  Sodium 141 136 - 145 mmol/L  Potassium 5.8 (H) 3.5 - 5.1 mmol/L  Chloride 112 95 - 115 mmol/L  CO2 27 21 - 32 mmol/L  Anion Gap 2 (L) 5 - 18  Glucose 158 (H) 70 - 100 mg/dL  BUN 27 (H) 8 - 25 mg/dL  Creatinine 1.61 0.96 - 1.30 mg/dL  Calcium 8.4 8.4 - 04.5 mg/dL  BUN/Creatinine Ratio 40.9 8.0 - 25.0  Total Protein 6.6 6.4 - 8.2 g/dL  Albumin 2.2 (L) 3.4 - 5.0 g/dL  Total Bilirubin 0.4 0.0 - 1.0 mg/dL  Alkaline Phosphatase 62 20 - 135 U/L  Alanine Aminotransferase (ALT) 18 12 - 78 U/L  Aspartate Aminotransferase (AST) 37 5 - 37 U/L  Globulin 4.4 g/dL  A/G Ratio 0.5   AST/ALT Ratio 2.1 See Comment  Osmolality Calculation 290 275 - 295 mOsm/kg  eGFR (Creatinine) 56 (L) >=60 mL/min/1.81m2 C-reactive protein  Collection Time: 01/03/22  7:37 PM Result Value Ref Range  C-Reactive Protein 12.5 (H) 0.0 - 1.0 mg/dL Magnesium  Collection Time: 01/03/22  7:37 PM Result Value Ref Range  Magnesium 1.9 1.4 - 2.2 mg/dL Phosphorus     (BH GH L LMW Y[facility])  Collection Time: 01/03/22  7:37 PM Result Value Ref Range  Phosphorus 3.2 2.5 - 4.9 mg/dL EKG  Collection Time: 81/19/14  7:39 PM Result Value Ref Range  Heart Rate 102 bpm  QRS Interval 118 ms  QT Interval 410 ms  QTC Interval 534 ms  P Axis    QRS Axis -57 deg  T Wave Axis 0 deg  P-R Interval    SEVERITY Abnormal ECG severity Blood culture-Collect Blood Cx prior to Antibiotics  Collection Time: 01/03/22  7:46 PM  Specimen: Peripheral; Blood Result Value Ref Range  Blood Culture No Growth to Date  Procalcitonin     (BH GH LMW Q YH)  Collection Time: 01/03/22  7:47 PM Result Value Ref Range  Procalcitonin 0.13 See Comment ng/mL Troponin T High Sensitivity, Emergency; 0 hour baseline AND 1 hour with reflex (3 hour)  Collection Time: 01/03/22  7:47 PM Result Value Ref Range  High Sensitivity Troponin T 75 (H) See Comment ng/L SARS CoV-2 (COVID-19) RNA-[facility])  Collection Time: 01/03/22  7:47 PM  Specimen: Nasopharynx; Viral Result Value Ref Range  SARS-CoV-2 RNA (COVID-19)  Negative Negative Influenza A+B/RSV by RT-PCR ([facility] GH LMW Y[facility])  Collection Time: 01/03/22  7:47 PM  Specimen: Nasopharynx; Viral Result Value Ref Range  Influenza A Negative Negative  Influenza B Negative Negative  Respiratory Syncytial Virus Negative Negative Respiratory Virus PCR Panel (w/o COVID/Flu/RSV) ([facility] GH LMW Y[facility])  Collection Time: 01/03/22  7:47 PM  Specimen: Nasopharynx; Viral Result Value Ref Range  Adenovirus Not Detected Not Detected  Human Metapneumovirus (HMPV) Not Detected Not Detected  Rhinovirus Not Detected Not Detected  Parainfluenza Virus 1 Not Detected Not Detected  Parainfluenza Virus 2 Not Detected Not Detected  Parainfluenza Virus 3 Not Detected Not Detected  Parainfluenza Virus 4 Not Detected Not Detected Protime and INR  Collection Time: 01/03/22  7:48 PM Result Value Ref Range  Prothrombin Time 10.9 9.4 - 12.0 seconds  INR 0.99 0.89 - 1.14 UA reflex to culture  Collection Time: 01/03/22  8:09 PM  Specimen: Urine, Clean Catch Result Value Ref Range  Reflex Urine Culture See Comment  Urinalysis with culture reflex     (BH LMW Y[facility])  Collection Time:  01/03/22  8:09 PM  Specimen: Urine, Clean Catch Result Value Ref Range  Clarity, UA Clear Clear  Color, UA Yellow Yellow, Colorless  Specific Gravity, UA 1.018 1.005 - 1.030  pH, UA 6.0 5.5 - 7.5  Protein, UA 1+ (A) Negative, Trace  Glucose, UA Negative Negative  Ketones, UA Negative Negative  Blood, UA Negative Negative  Bilirubin, UA Negative Negative  Leukocytes, UA 3+ (A) Negative  Nitrite, UA Negative Negative  Urobilinogen, UA <2.0 <=2.0 mg/dL Urine microscopic     (BH GH LMW Y[facility])  Collection Time: 01/03/22  8:09 PM Result Value Ref Range  RBC/HPF, UA 1 0 - 2 /HPF  WBC/HPF, UA 16 (H) 0 - 5 /HPF  Urine Squamous Epithelial Cells, UA 1 0 - 5 /HPF Urine culture  Collection Time: 01/03/22  8:09 PM  Specimen: Urine, Clean Catch Result Value Ref Range  Urine Culture, Routine Culture too young to evaluate; reincubating  Troponin T High Sensitivity, 1 Hour With Reflex (BH GH LMW Y[facility])  Collection Time: 01/03/22  9:10 PM Result Value Ref Range  High Sensitivity Troponin T 70 (H) See Comment ng/L  1 hour Delta from 0 Hour, HS-Troponin T -5 ng/L Blood gas, arterial  Collection Time: 01/03/22 10:32 PM Result Value Ref Range  Site left radial   pH Arterial 7.42 7.35 - 7.45 units  pCO2, Arterial 38 35 - 45 mmHg  pO2, Arterial 121 (H) 75 - 100 mmHg  Calculated HCO3, Arterial 24.0 20.0 - 26.0 mmol/L  Base Excess, Arterial 0 -2 - 2 mmol/L  Patient Temperature 37.0 Celsius  O2 Sat, Arterial 98 (H) 95 - 97 %  Carboxyhemoglobin, Arterial 1.1 0.5 - 1.5 %  Methemoglobin, Arterial 1.2 0.0 - 1.5 %  Appliance HI-FLOW: 60 LPM   FIO2 50 % Troponin T High Sensitivity, 3 Hour (BH GH LMW Y[facility])  Collection Time: 01/03/22 11:22 PM Result Value Ref Range  High Sensitivity Troponin T 70 (H) See Comment ng/L  1 hour Delta from 0 Hour, HS-Troponin T -5 ng/L  3 hour Delta from 0 Hour, HS-Troponin T -5 ng/L POCT Glucose  Collection Time: 01/04/22 12:22 AM Result Value Ref Range  Glucose, Meter 133 (H) 70 - 100 mg/dL Basic metabolic panel  Collection Time: 01/04/22 12:43 AM Result Value Ref Range  Sodium 146 (H) 136 - 145 mmol/L  Potassium 4.6 3.5 - 5.1 mmol/L  Chloride 115 95 - 115 mmol/L  CO2 25 21 - 32 mmol/L  Anion Gap 6 5 - 18  Glucose 133 (H) 70 - 100 mg/dL  BUN 26 (H) 8 - 25 mg/dL  Creatinine 2.95 6.21 - 1.30 mg/dL  Calcium 7.8 (L) 8.4 - 10.3 mg/dL  BUN/Creatinine Ratio 30.8 8.0 - 25.0  Osmolality Calculation 297 (H) 275 - 295 mOsm/kg  eGFR (Creatinine) >60 >=60 mL/min/1.48m2 NT-proBrain natriuretic peptide  Collection Time: 01/04/22 12:43 AM Result Value Ref Range  NT-proBNP 9,175.0 (H) 0.0 - 1,800.0 pg/mL Basic metabolic panel  Collection Time: 01/04/22  5:45 AM Result Value Ref Range  Sodium 147 (H) 136 - 145 mmol/L  Potassium 4.3 3.5 - 5.1 mmol/L  Chloride 116 (H) 95 - 115 mmol/L  CO2 26 21 - 32 mmol/L  Anion Gap 5 5 - 18  Glucose 110 (H) 70 - 100 mg/dL  BUN 24 8 - 25 mg/dL  Creatinine 6.57 8.46 - 1.30 mg/dL  Calcium 7.9 (L) 8.4 - 10.3 mg/dL  BUN/Creatinine Ratio 96.2 (H) 8.0 - 25.0  Osmolality Calculation 297 (H) 275 - 295  mOsm/kg  eGFR (Creatinine) >60 >=60 mL/min/1.49m2 CBC auto differential  Collection Time: 01/04/22  5:45 AM Result Value Ref Range  WBC 10.2 4.0 - 11.0 x1000/?L  RBC 3.57 (L) 4.00 - 6.00 M/?L  Hemoglobin 10.7 (L) 13.2 - 17.1 g/dL  Hematocrit 40.98 (L) 11.91 - 50.00 %  MCV 95.8 80.0 - 100.0 fL  MCH 30.0 27.0 - 33.0 pg  MCHC 31.3 31.0 - 36.0 g/dL  RDW-CV 47.8 29.5 - 62.1 %  Platelets 277 150 - 420 x1000/?L  MPV 9.5 8.0 - 12.0 fL  Neutrophils 71.2 39.0 - 72.0 %  Lymphocytes 20.4 17.0 - 50.0 %  Monocytes 7.6 4.0 - 12.0 %  Eosinophils 0.2 0.0 - 5.0 %  Basophil 0.1 0.0 - 1.4 %  Immature Granulocytes 0.5 0.0 - 1.0 %  nRBC 0.0 0.0 - 1.0 %  ANC(Abs Neutrophil Count) 7.23 2.00 - 7.60 x 1000/?L  Absolute Lymphocyte Count 2.07 0.60 - 3.70 x 1000/?L  Monocyte Absolute Count 0.77 0.00 - 1.00 x 1000/?L  Eosinophil Absolute Count 0.02 0.00 - 1.00 x 1000/?L  Basophil Absolute Count 0.01 0.00 - 1.00 x 1000/?L  Absolute Immature Granulocyte Count 0.05 0.00 - 0.30 x 1000/?L  Absolute nRBC 0.00 0.00 - 1.00 x 1000/?L POC Glucose (Fingerstick)  Collection Time: 01/04/22  7:21 AM Result Value Ref Range  Glucose, Meter 112 (H) 70 - 100 mg/dL POCT Glucose  Collection Time: 01/04/22  7:51 AM Result Value Ref Range  Glucose, Meter 99 70 - 100 mg/dL POCT Glucose  Collection Time: 01/04/22 11:53 AM Result Value Ref Range  Glucose, Meter 94 70 - 100 mg/dL Diagnostics:CXR: New/increased multifocal pneumonia most prominently at the right upper lobe. CRP 12 on admission, WBC 10.2, procal 0.13 Assessment Assessment: Assessment:88 yo male, resident at [facility], with PMH of AFib (not on A/C), DM (not on meds), HTN, BPH, anxiety, hypothyroidism, cavernous malformation, small traumatic intraparenchymal hemorrhage in 2019, memory loss, gait abnormality.Was in [facility] 2/15-2/21 from  home with lethargy, tachypnea, hypoxemia. Found to have bilateral pneumonia. Question sepsis given lethargy (encephalopathy), AKI (cr 1.5 from normal baseline), tachypnea and tachycardia. Improved with IVF, course of azithro/rocephin. Diet resumed and was on RA prior to transfer to [facility] for STR. On 3/4 pt got lethargic with tachypnea and was desaturating in low 80's so sent back to ED. In ED pt altered with hypoxia, soft BP, worsening infiltrate on CXR. Placed on HF oxygen, bolused with IVF, started on IV vanco and merem, made NPO and admitted to tele Plan Plan: Recurrence of pneumonia with worsening infiltrates on CXR, acute reap failure, encephalopathy - likely from aspiration but started on vanco and meropenem to cover for healthcare associated pathogens - on HF - NPO - got boluses in ED, now on D5W 75cc/h as mildly hypernatremic - trend WBC, procal, CRP - follow ID workup - prognosis is guarded - improving; now on RA - competed 5 days of azithromycin on 2/19; completing g7 days of Rocephin today- Passed SLP eval on 2/17 for dysphagia diet; purees and nectars; - BCx (2/15) - NGTD- RVP by PCR  - neg- MRSA Cx - neg - WBC, procal, and CRP all improving - stable for tranfer to STR when arrangement made?Mild AKI - resolved?Acute metabolic encephalopathy on the background of dementia; secondary to acute infection/pneumonia- Treat pneumonia- NPO so holding Lexapro 10mg  hs, Melatonin qhs and Zyprexa 2.5mg  qhs on discharge?HTN, HLD- holding Toprol 25mg  and Pravastatin 20mg  qd qd- ECHO (2/17) - EF 55-60%, normal RV size and function,  RVSP severely elevated, mild-mod MR?DM type 2 - A1c 5.9; diet controlled; SSI ordered but can likely stop in a day or so?BPH - hold Proscar 5mg  qd and Flomax 0.4mg  bid; has high residuals at baseline ( at times 500-600) ?Hypothyroidism- takes L-thyroxine qd at home; if still NPO after 48 hours can order appropriate dose of IV formulationTroponemia - trop around 70 and flat; likely demand ischemia in the setting of pneumonia/acute reps failure?FEN- NPO as very altered now; was on Pureed and nectars with feeding assist and aspiration precautions as rec by SLP eval on 2/17; SLP eval once/if his mental status back to baseline?DVT ppx - Lovenox ordered, SCD's?Code status: DNR?prognosis is concerningPCP is Dr Laurelyn Sickle Electronically Signed:[id number] Shamaria Kavan, MD Beeper 01/04/2022, 2:37 PM

## 2022-01-04 NOTE — ACP (Advance Care Planning)
Discussed code status with patient's daughter at bedside. She states that he has a DNR and in the event of cardiopulmonary arrest, he does not wish to be intubated or have chest compressions/defibrillation. Okay for NIVV. Code status ordered as No ACLS/BLS in Epic to reflect the patient/family wishes. Signed:Sierria Bruney, DO [id number] [phone] #[id number]/01/2022

## 2022-01-04 NOTE — ED Notes
7:34 PM - BIBA due to AMS. Pt found to be hypotensive and tachycardic on arrival. Pt is A&Ox3, able to give his name, DOB and recognizes he's in the hospital. Pt is speaking in short phrases, states he is SOB, denies CP. Sepsis workup completed and sent to lab.  Mikle Bosworth RN as chaperone for urinary straight cath. Assisting Dr. Concha Pyo at bedside with assessment. Pt safety maintained with call bell in reach, side rails up. 8PM- Vitals improving, on continuous pulse ox and tele.  Meds given as per eMAR

## 2022-01-04 NOTE — Plan of Care
Plan of Care Overview/ Patient Status    Pt tachypneic, increased WOB, congested cough, on 5 L N/C sating 93-95%. Placed on HFNC 60 L, 50% FIO2, tolerated well.  Abg completed. Daughter at bedside. Continue monitoring. 0240: pt transferred to int care unit. fio2 titrated to  40%. SAT 96%.

## 2022-01-04 NOTE — ED Notes
1:30 AM - Report given to [facility], arranged for transport.

## 2022-01-04 NOTE — Plan of Care
Plan of Care Overview/ Patient Status    Received on HFNC 60L at 40%, asleep, no respiratory distress noted, SpO2 97%. Will continue to monitor.

## 2022-01-04 NOTE — H&P
[facility]	Medicine Housestaff History & PhysicalAttending Provider: Jene Every, MDHistory provided by: adult child(ren)History limited by: the condition of the patientHistory of Present Illness:  CC: Shortness of breath HPI: 88 y.o. male PMHx of hypertension, atrial fibrillation, type 2 diabetes (diet controlled), dementia, glaucoma, macular degeneration, and BPH recently discharged from [facility] to the [facility] after hospitalization for multifocal PNA treated with 7 days of rocephin and azithromycin BIBA ambulance for shortness of breath and hypoxia to low 80s per EMS. Per patient's daughter at bedside his only complaint has been shortness of breath. He denies chest pain, nausea, vomiting, diarrhea, abdominal pain. No reported fever or chills. In the ED, vitals: BP initially 93/70 HR 130 RR 36 SpO2 94% on 6L NC. Blood pressure and HR improved with 1 L NS bolus. Labs with WBC 10.7 Hgb 12.2 72.3% neutrophils. Na 141 K 5.8 BUN/Cr 27/1.17. Trop 75 --> 70. Lactate 1.7. CRP 12.5, procal pending.  EKG on arrival with A flutter rate of 102. CXR with worsening multifocal PNA worse in the RUL. Patient was started on HFNC and admitted to telemetry. Review of Systems: 12 point ROS completed and neg except for data in HPIMedical History: Past Medical History: Diagnosis Date ? Anxiety  ? Closed displaced fracture of left femoral neck (HC Code)  ? GERD (gastroesophageal reflux disease)  ? Glaucoma  ? Hyperlipidemia  ? Hypertension  ? Hypothyroidism  ? IBS (irritable bowel syndrome)  ? Kyphosis  ? Lumbosacral radiculopathy  ? Major depression  ? Osteoarthritis  ? Paroxysmal atrial fibrillation (HC Code) (HC CODE) (HC Code)  ? Pneumonia  ? Vitamin deficiency  No past medical history pertinent negatives. Family History Problem Relation Age of Onset ? No Known Problems Mother  ? No Known Problems Father   Past Surgical History: Procedure Laterality Date ? FRACTURE SURGERY   No past surgical history pertinent negatives on file. Social History Socioeconomic History ? Marital status: Widowed   Spouse name: Not on file ? Number of children: Not on file ? Years of education: Not on file ? Highest education level: Not on file Occupational History ? Not on file Tobacco Use ? Smoking status: Never ? Smokeless tobacco: Never Substance and Sexual Activity ? Alcohol use: No ? Drug use: No ? Sexual activity: Not Currently Other Topics Concern ? Not on file Social History Narrative ? Not on file Social Determinants of Health Financial Resource Strain: Low Risk  ? Difficulty of Paying Living Expenses: Not hard at all Food Insecurity: No Food Insecurity ? Worried About Programme researcher, broadcasting/film/video in the Last Year: Never true ? Ran Out of Food in the Last Year: Never true Transportation Needs: No Transportation Needs ? Lack of Transportation (Medical): No ? Lack of Transportation (Non-Medical): No Physical Activity: Not on file Stress: Not on file Social Connections: Not on file Intimate Partner Violence: Not on file Housing Stability: Low Risk  ? Housing Stability: I have a steady place to live  Home Medications: (Not in a hospital admission)Allergies as of 01/03/2022 - Review Complete 01/03/2022 Allergen Reaction Noted ? Gluten protein Other (See Comments) 10/29/2013 ? Lactose Other (See Comments) 10/29/2013 ? Avelox [moxifloxacin]  02/16/2016 ? Ciprofloxacin  11/10/2018 ? Penicillins  10/28/2013 ? Sulfa (sulfonamide antibiotics)  10/28/2013 Objective: Vitals:Temp:  [99.6 ?F (37.6 ?C)] 99.6 ?F (37.6 ?C)Pulse:  [94-130] 106Resp:  [36-43] 42BP: (93-148)/(64-76) 148/76SpO2:  [94 %-96 %] 96 %Device (Oxygen Therapy): high-flow nasal cannula;humidified;heatedO2 Flow (L/min):  [5-60] 60 on HFNCWeight:   Physical  Exam:Gen: Ill appearing, lying in bed, HFNC in place HEENT: PERRL, anicteric, oropharynx benign, dry mucous membranes CV: tachycardic, irregularPulm: Crackles bilaterally, R > L. Decreased air movement. Tachypneic/increased WOBGI: Soft, NT, ND, +BS. No guarding or rebound tenderness. Ext: No peripheral edema, WWPNeuro: Awake, lethargic. Skin: No rashes Labs: Recent Labs Lab 02/28/230945 03/04/231936 WBC 10.1 10.7 HGB 11.6* 12.2* HCT 37.30* 38.90 PLT 287 355  Recent Labs Lab 02/28/230945 03/04/231936 NEUTROPHILS 72.0 72.3*  Recent Labs Lab 02/28/230945 03/04/231937 NA 141 141 K 4.2 5.8* CL 108 112 CO2 27 27 BUN 23 27* CREATININE 0.95 1.17 GLU 137* 158* ANIONGAP 6 2*  Recent Labs Lab 02/28/230945 03/04/231937 CALCIUM 8.0* 8.4 MG  --  1.9  Recent Labs Lab 03/04/231937 ALT 18 AST 37 ALKPHOS 62 BILITOT 0.4 PROT 6.6 ALBUMIN 2.2*  Recent Labs Lab 03/04/231948 LABPROT 10.9 INR 0.99  Diagnostics/Radiology:XR Chest PA or APResult Date: 3/4/2023Limited AP view- New/increased multifocal pneumonia most prominently at the right upper lobe. [facility] Radiology Notify System Classification: Orange - Urgent without Follow-up Reported and Signed by:  Lonia Chimera, MD XR Chest PA or APResult Date: 2/15/2023Limited AP view- 1. Opacities the left mid and right lower lung zones concerning for pneumonia in the setting of sepsis. 2. Moderate left pleural effusion. [facility] Radiology Notify System Classification: Orange - Urgent without Follow-up Reported and Signed by:  Lonia Chimera, MD US RenalResult Date: 2/15/2023Normal-appearing kidneys. Thick-walled trabeculated urinary bladder with layering internal debris which may represent urinary tract infection or blood products. Correlate clinically. Small left pleural effusion. Reported and Signed by:  Antonieta Iba, MD Micro:_ Blood culture-Collect Blood Cx prior to Antibiotics  Collection Time: 01/03/22  7:46 PM  Specimen: Peripheral; Blood Result Value  Blood Culture No Growth to Date Blood culture-Collect Blood Cx prior to Antibiotics  Collection Time: 01/03/22  7:35 PM  Specimen: Peripheral; Blood Result Value  Blood Culture No Growth to Date _ Urine culture  Collection Time: 12/25/21 11:00 PM  Specimen: Urine, Clean Catch; Culture Result Value  Urine Culture, Routine No Growth No results found for this or any previous visit.ECG/tele: Previous studies: Assessment & Plan: Attending Provider: Jene Every, MD [phone]: ID Dr. Eduardo Osier 88 y.o. male with PMHx of hypertension, atrial fibrillation, type 2 diabetes (diet controlled), dementia, glaucoma, macular degeneration, and BPH presents with hypoxia and dyspnea found to have worsening bilateral pneumonia on imaging. #Acute hypoxemic respiratory failure 2/2 bilateral, multifocal pneumonia#Recent hospitalization#Sepsis 2/2 pulmonary source- Will start Meropenem 1g q12h given PCN allergy and vancomycin, discussed with Dr. Eduardo Osier- Patient received 1L NS by EMS and 1L NS in ED exceeding 30 cc/kg- Start 100 cc/hr NS- Lower respiratory culture - Check MRSA culture nares - Follow up blood and urine cultures - Check ABG - Influenza and COVID negative- Follow up extended respiratory viral panel - Trend procal and CRP (elevated to 12.5)- Tylenol for fevers- Narrow abx pending respiratory cultures- Acapella and incentive spirometer as able- Suctioning for worsening secretions- Consider pulmonology consult in AM - Check urine legionella and strep - Repeat SLP eval in AM #Elevated troponin- hs trop 75 - 70 likely in setting of demand 2/2 elevated HR - Repeat troponin and EKG # HTN# HLD- Hold PTA Toprol and statin 2/2 NPO- If BP allows can give low dose IV metoprolol in AM- ECHO (2/17) - EF 55-60%, normal RV size and function, RVSP severely elevated, mild-mod MR?#DM type 2 - A1c 5.9; diet controlled- POCT glucose checks and SSI?#BPH - Holding PTA Proscar 5mg  qd and Flomax 0.4 mg bid as  NPO- Bladder scans and straight cath if retaining?#Hypothyroidism- Cont Levothyroxine 150 mcg qd po - Can change to IV in AM if still not taking PODIET: NPOFLUIDS: NS 100 cc/hrELECTROLYTES: Replete PRN PPX: LovenoxACCESS: PIVDISPO: TelemetryCODE STATUS: No ACLS/BLS confirmed with daughter  Primary Emergency Contact: Sommer,[name], Home Phone: [phone]: PCP: Hilliard Clark was notified of this admission. YesPlan discussed with patient and/or family. YesSigned:Adrine Hayworth DO PGY-3MHB: [phone] #[id number] 4, 202310:33 PM

## 2022-01-04 NOTE — Other
PHARMACY-ASSISTED MEDICATION REPORTPharmacist review of the best possible medication history obtained by the pharmacy medication history technician has been performed.  I have updated the home medication list and identified the following information that may be relevant to this admission. Recommendations:  none Notes:Home medication list updated and reviewed. Medication history completed through review of patient's W10 provided by [facility], 01/03/2022 dated and called Nurse at facility [phone].Current Medications Medication Sig Note  acetaminophen (TYLENOL) 325 mg tablet Take 2 tablets (650 mg total) by mouth every 6 (six) hours as needed (Pain/Fever).   azelastine (OPTIVAR) 0.05 % ophthalmic solution Place 1 drop into both eyes daily.   cholecalciferol, vitamin D3, 125 mcg (5,000 unit) capsule Take 1 capsule (5,000 Units total) by mouth. In the morning every Tuesday and Friday.   enoxaparin (LOVENOX) 40 mg/0.4 mL syringe Inject 40 mg under the skin daily. For 2 weeks 01/04/2022: Kathie Dike [facility]): Per Nurse at facility expected to complete therapy on 01/07/2022.  escitalopram oxalate (LEXAPRO) 10 mg tablet Take 1 tablet (10 mg total) by mouth at bedtime.   finasteride (PROSCAR) 5 mg tablet Take 1 tablet (5 mg total) by mouth daily.   latanoprost (XALATAN) 0.005 % ophthalmic solution Place 1 drop into both eyes nightly.   levothyroxine (SYNTHROID, LEVOTHROID) 150 mcg tablet Take 1 tablet (150 mcg total) by mouth every morning. On an empty stomach, at least 30 minutes before breakfast.   magnesium hydroxide (MILK OF MAGNESIA) 400 mg/5 mL suspension Take 30 mLs by mouth daily as needed for constipation.   melatonin 1 mg tablet Take 1 tablet (1 mg total) by mouth nightly.   metoprolol succinate XL (TOPROL-XL) 25 mg 24 hr tablet Take 0.5 tablets (12.5 mg total) by mouth daily. Take with or immediately following a meal. nut.tx,spec.frm,l-fr,iron-fos (TWOCAL HN) 0.08-2 gram-kcal/mL Liqd Take 237 mLs by mouth daily.   OLANZapine (ZYPREXA) 5 mg tablet Take 2.5 mg by mouth nightly.   omeprazole (PRILOSEC) 20 mg capsule Take 1 tablet (20 mg total) by mouth daily.   polyethylene glycol (MIRALAX) 17 gram packet Take 1 packet (17 g total) by mouth daily as needed for constipation. Mix in 8 ounces of water, juice, soda, coffee or tea prior to taking.   pravastatin (PRAVACHOL) 20 mg tablet Take 1 tablet (20 mg total) by mouth daily with dinner.   propylene glycoL (SYSTANE BALANCE) 0.6 % ophthalmic solution Place 1 drop into both eyes 3 (three) times daily.   tamsulosin (FLOMAX) 0.4 mg 24 hr capsule Take 1 capsule (0.4 mg total) by mouth 2 (two) times daily.   timolol (TIMOPTIC) 0.5 % ophthalmic solution Place 1 drop into both eyes daily.  Dorna Leitz, [id number] PM3/5/2023Phone: Available via [employer] (MHB)

## 2022-01-04 NOTE — Plan of Care
Plan of Care Overview/ Patient Status    Problem: Physical Therapy GoalsGoal: Physical Therapy GoalsDescription: PT GOALS1. Patient will perform bed mobility with minimal assist2. Patient will perform transfers with minimal assist using appropriate assistive device 3. Patient will ambulate a minimum of 10 feet with minimal assist using appropriate assistive device  Outcome: Initial problem identification Physical Therapy Evaluation Patient Overview - 01/04/22 1405    Date of Visit / Treatment  Date of Visit / Treatment 01/04/22   Note Type Evaluation   Start Time 1337   Total Treatment Time 15 min    Patient Overview  History of Present Illness 88 yo male, resident at [place], with PMH of AFib (not on A/C), DM (not on meds), HTN, BPH, anxiety, hypothyroidism, cavernous malformation, small traumatic intraparenchymal hemorrhage in 2019, memory loss, gait abnormality. Presented  with hypotension and  hypoxia. Dx with R sided PNA.   Precautions fall   Social History - Additional Details/Comments Pt lives at [facility]. Pt has private 24/7 aide at baseline and requires assist with mobility and ADLs w/ RW and w/c   Prior Level of Function assist with mobility;assist with ADLs   Subjective  Ok when asked by PT and his daughter Aimy if he wants to do bed exercise.   General Observations received in bed, NAD, on HF, daughter and son at bedside      Assessment - 01/04/22 1405    Assessment  Cognition (Mentation/Communication) alert   Cognition - Additional Details/Comments but as per daughter pt always close his eyes eventhough he's awake   Vital Signs vital signs stable   Pain Rating 0   Pain Location - Additional Details/Comments denies any pain or facial grimacing during bed exercise   Skin Integrity/Edema see skin documentation   Sensation no complaints   Range of Motion within functional limits as demonstrated with mobility/function   Muscle Strength/Tone - Additional Details/Comments Pt was able to do AAROM during bed therex   Balance - Additional Details/Comments Unable to sit or stand at this time      Functional Mobility - 01/04/22 1405    Functional Mobility  Supine to/from Sit Not tested   Sit to/from Stand Not tested   Overall Functional Mobility Comments Pt and family agreed to do bed therex      AMPAC Basic Mobility - 01/04/22 1405    PT- AM-PAC - Basic Mobility Screen- How much help from another person do you currently need.....  Turning from your back to your side while in a a flat bed without using rails? 2 - A Lot - Requires a lot of help (maximum to moderate assistance). Can use assistive devices.   Moving from lying on your back to sitting on the side of a flat bed without using bed rails? 2 - A Lot - Requires a lot of help (maximum to moderate assistance). Can use assistive devices.   Moving to and from a bed to a chair (including a wheelchair)? 1 - Total - Requires total assistance or cannot do it at all.   Standing up from a chair using your arms(e.g., wheelchair or bedside chair)? 1 - Total - Requires total assistance or cannot do it at all.   To walk in a hospital room? 1 - Total - Requires total assistance or cannot do it at all.   Climbing 3-5 steps with a railing? 1 - Total - Requires total assistance or cannot do it at all.   AMPAC Mobility Score 8  TARGET Highest Level of Mobility Mobility Level 3, Sit on edge of bed      Recommendations for IP Admission - 01/04/22 1405    PT Recommendations for Inpatient Admission  Activity/Level of Assist out of bed;transfers only;assist of 2;mechanical lift   Therapeutic Exercise encourage exercise program issued      Clinical Impression/Recommendation - 01/04/22 1405    Clinical Impression / Recommendation  Initial Assessment Pt is a 88 yo M, resident at [place], with PMH of AFib , DM , HTN, BPH, anxiety, hypothyroidism, cavernous malformation, small traumatic intraparenchymal hemorrhage in 2019, memory loss, gait abnormality. Presented  with hypotension and  hypoxia. Dx with R sided PNA. Pt was attempted earlier but family requested to be seen later. PT came back, pt was sleeping, NAD, PT and daughter [name] woke him up and agreed to do bed exercise.  Cues to keep eyes open but he only open his eyes one time and he was able to participate initially with AAROM, eventually PT just did PROM. Limited by fatigue, weakness and decrease activity tolerance.  Left in bed with NAD, Notified RN. Recommend STR when medically.   Patient Goal return to prior level of function   PT Frequency 4x per week   Physical Therapy Disposition Recommendation Short Term Rehab   Equipment Recommendations for Discharge Patient has all necessary durable medical equipment      PT Handoff - 01/04/22 1405    Handoff Documentation  Handoff Patient in bed;Bed alarm;Discussed with nursing     Gabriel Carina, PTPhysical Medicine DepartmentGreenwich Village Surgicenter Limited Partnership

## 2022-01-04 NOTE — Progress Notes
[facility]	Progress Note  1Attending Provider: Jene Every, MDSubjective: 24 hour events:88 yo male (lives at [place]) with h/o Afib (not on ac), DM (not on meds), HTN, BPH, anxiety, hypothyroidism, cavernous malformation, small traumatic intraparenchymal hemorrhage in 2019, memory loss, gait abnormality admitted yesterday for lethargy/tachypnea and desaturation to low 80s, found to have recurrence of pneumonia. Admitted for IV abx (vanc/mero. Requiring HFNC for O2 support. Prognosis guarded.Patient seen and assessed at bedside this morning. Patient is toxic appearing and very lethargic - however, opens eyes to verbal stimuli. Moans and groans to questions. Unable to obtain ROS. Meds: Scheduled Meds:Current Facility-Administered Medications Medication Dose Route Frequency Provider Last Rate Last Admin ? insulin lispro (Admelog, HumaLOG) Sliding Scale (See admin instructions for dose) 1-18 Units  1-18 Units Subcutaneous Q6H Retalis, Cleora Fleet, DO     ? latanoprost (XALATAN) 0.005 % ophthalmic solution 1 drop  1 drop Both Eyes Nightly Retalis, Cleora Fleet, DO     ? meropenem (MERREM) 1 g in sodium chloride 0.9% 100 mL (mini-bag plus)  1 g Intravenous Q12H Marshell Levan, MD 33.33 mL/hr at 01/04/22 0910 1 g at 01/04/22 0910 ? sodium chloride 0.9 % flush 3 mL  3 mL IV Push Q8H Retalis, Cleora Fleet, DO   3 mL at 01/04/22 0630 ? [START ON 01/05/2022] vancomycin (VANCOCIN) 1 g in sodium chloride 0.9% 250 mL IVPB (vialmate)  1 g Intravenous Q36H Mutic, Marquita Palms, MD     Continuous Infusions:? dextrose 5 % 75 mL/hr (01/04/22 1225) PRN Meds:dextrose (GLUCOSE) oral liquid 15 g **OR** fruit juice **OR** skim milk, dextrose (GLUCOSE) oral liquid 15 g **OR** fruit juice **OR** skim milk, dextrose (GLUCOSE) oral liquid 30 g **OR** fruit juice, dextrose (GLUCOSE) oral liquid 30 g **OR** fruit juice, dextrose injection, dextrose injection, glucagon, sodium chlorideObjective: Vitals:Temp:  [97.3 ?F (36.3 ?C)-99.6 ?F (37.6 ?C)] 97.3 ?F (36.3 ?C)Pulse:  [61-130] 61Resp:  [18-43] 19BP: (93-171)/(41-89) 150/76SpO2:  [94 %-98 %] 98 %Device (Oxygen Therapy): high-flow nasal cannula;humidifiedO2 Flow (L/min):  [5-60] 60 I/O's:Intake/Output Summary (Last 24 hours) at 01/04/2022 1807Last data filed at 01/04/2022 1225Gross per 24 hour Intake 1881.42 ml Output -- Net 1881.42 ml  Physical Exam:Gen: Ill appearing, lying in bed, HFNC in place HEENT: PERRL, anicteric, oropharynx benign, dry mucous membranes CV: tachycardic, irregularPulm: Bilateral crackles (R worse than L). Diffusely decreased air entry. Increased WOB on HFNC. GI: Soft, NT, ND, +BS. No guarding or rebound tenderness. Ext: No peripheral edema, WWPNeuro: Awake, lethargic. arouses to voice.Skin: No rashesLabs: Labs:Recent Labs   03/04/231937 03/05/230043 03/05/230545 NA 141 146* 147* K 5.8* 4.6 4.3 CL 112 115 116* CO2 27 25 26  BUN 27* 26* 24 CREATININE 1.17 1.04 0.88 Recent Labs   03/04/231937 03/05/230043 03/05/230545 CALCIUM 8.4 7.8* 7.9* MG 1.9  --   --  PHOS 3.2  --   --  Recent Labs   03/05/230721 03/05/230751 03/05/231153 03/05/231715 GLU 112* 99 94 108* Recent Labs   03/04/231936 03/05/230545 WBC 10.7 10.2 HGB 12.2* 10.7* HCT 38.90 34.20* PLT 355 277 MCV 94.9 95.8 RDWCV 13.4 13.3 Recent Labs   03/04/231937 ALT 18 AST 37 ALKPHOS 62 BILITOT 0.4 ALBUMIN 2.2* Recent Labs   03/04/231948 LABPROT 10.9 INR 0.99 Recent Labs   03/04/231936 LACTATE 1.7 Recent Labs   03/04/231947 PROCALCITON 0.13 No results for input(s): TROPONINI, CKTOTAL in the last 72 hours.Recent Labs   03/05/230043 BNPPRO 9,175.0* No results for input(s): HGBA1C in the last 72 hours.No results for input(s): LIPASE in the last 72 hours.No results for input(s): AMMONIA in  the last 72 hours.Urine Studies:No results for input(s): OSMOLALITYUR, SODIUMURR, ALBURR, PRCRRAU, PROTUR, CREATININEUR, UREAUR in the last 72 hours.Recent Labs   03/04/232009 CLARITYU Clear COLORU Yellow SPECGRAV 1.018 PHUR 6.0 PROTEINUA 1+* GLUCOSEU Negative KETONESU Negative BLOODU Negative BILIRUBINUR Negative LEUKOCYTESUR 3+* NITRITE Negative UROBILINOGEN <2.0 Imaging 24H:XR Chest PA or APResult Date: 3/4/2023Limited AP view- New/increased multifocal pneumonia most prominently at the right upper lobe. [facility] Radiology Notify System Classification: Orange - Urgent without Follow-up Reported and Signed by:  Lonia Chimera, MD Echo:Results for orders placed or performed during the hospital encounter of 12/17/21 Echo 2D Complete w Doppler and CFI if Ind Image Enhancement 3D and or bubbles Result Value Ref Range  Reported Visual Range EF% 55-60 %  Narrative  ~ * Normal left ventricular size and systolic function. Mild concentric left ventricular hypertrophy.  LVEF estimated by visual assessment was between 55-60%.* Normal right ventricular cavity size, systolic function and wall motion.  Estimated right ventricular systolic pressure is 61 mmHg.  RV systolic pressure is severely elevated.* Atria are normal in size.* Mild aortic regurgitation.* Mild-moderate mitral regurgitation* Mild - moderate tricuspid regurgitation Cultures:Recent Labs Lab 03/04/231935 03/04/231946 LABBLOO No Growth to Date No Growth to Date Recent Labs Lab 03/04/232009 LABURIN Culture too young to evaluate; reincubating No results for input(s): THROATCX in the last 168 hours.No results for input(s): SPNEUMONIAEU in the last 168 hours.No results for input(s): LEGIONELLAAG in the last 168 hours.No results for input(s): DEEPWOUNDCX in the last 168 hours.No results for input(s): AFBCULTURE in the last 168 hours.No results for input(s): AFBSTAIN in the last 168 hours.No results for input(s): AFBST in the last 168 hours.Recent Labs Lab 03/04/231947 INFLUENZAART Negative Recent Labs Lab 03/04/231947 INFLUENZBRT Negative  EKG: Results for orders placed or performed during the hospital encounter of 01/03/22 EKG Result Value Ref Range  Heart Rate 102 bpm  QRS Interval 118 ms  QT Interval 410 ms  QTC Interval 534 ms  P Axis    QRS Axis -57 deg  T Wave Axis 0 deg  P-R Interval    SEVERITY Abnormal ECG severity  Assessment & Plan: Attending Provider: Dr. Marshell Levan?Consults: ID Dr. Eduardo Osier ?88 y.o. male with PMHx of hypertension, atrial fibrillation, type 2 diabetes (diet controlled), dementia, glaucoma, macular degeneration, and BPH presents with hypoxia and dyspnea found to have worsening bilateral pneumonia on imaging. Prognosis guarded. ?#Acute hypoxemic respiratory failure 2/2 bilateral, multifocal pneumonia#Recent hospitalization#Sepsis 2/2 pulmonary sourceABG: 7.42/38/121/98 Influenza and COVID negativeViral panel negative- c/w Meropenem 1g q12h (3/4 - ) given PCN allergy and vancomycin (3/4- ), discussed with Dr. Eduardo Osier- s/p 2L NS bolus (1 by EMS, 1L in ED)- c/w IVF: switched to d5w iso hypernatremia to 147- f/u Lower respiratory culture: pending- f/u MRSA: pending- f/u BCx: pending - f/u urine cultures : pending- f/u urine legionella/strep- currently requiring HFNC, wean off as tolerated- Tylenol for fevers, trend inflammatory markers (wbc, procal, crp)- Narrow abx pending respiratory cultures- Acapella and incentive spirometer as able- Suctioning for worsening secretions- NPO until SLP eval?#Elevated troponinLikely demand ischemia iso acute illness/infection- hs trop 75 - 70 - 70- monitor for now#hypernatremiaNa 147- switch fluids to D5w 75cc/hr- will f/u am bmp?# HTN# HLD- Hold PTA Toprol and statin 2/2 NPO- ECHO (2/17) - EF 55-60%, normal RV size and function, RVSP severely elevated, mild-mod MR- metoprolol IV 2.5mg  prn for SBP >170 (careful with metoprolol pushes as patient as an underlying acute illness/infection)?#DM type 2 - A1c 5.9; diet controlled- POCT glucose checks and SSI?#BPH - Holding PTA Proscar 5mg   qd and Flomax 0.4 mg bid as NPO- Bladder scans and straight cath if retaining?#Hypothyroidism- hold Levothyroxine 150 mcg qd po iso NPO- if still NPO after SLP eval tomorrow, will start IV levothyroxine# GOC- prognosis guarded- without prompting, family expressed wishes to continue to discuss GOC going forward - family understands the poor prognosis and would like to discuss palliative consult/CMO approach if no improvements or decline in the next few days. ?DIET: NPOFLUIDS: D5w 75cc/hrELECTROLYTES: Replete PRN PPX: Lovenox, SCDsACCESS: PIVDISPO: TelemetryCODE STATUS: No ACLS/BLS? Primary Emergency Contact: Sommer,[name], Home Phone: [phone] Signed:Makinze Jani, MD, PGY-13/03/2022 Mobile [facility] : [phone]

## 2022-01-04 NOTE — Plan of Care
Plan of Care Overview/ Patient Status    Received patient from ED at 0232.Patient oriented x2 - able to answer his name, birthday and that he was in the hospital. No other orientation questions. Patients eyes were closed, opens them for a second after repeated requests, then closes them again.Aflutter on the monitor HR 90s-100s/HF 40% / 60LSkin - arrived with multiple foams to back and coccyx. Skin assessed underneath, cleansed and foams reapplied. Wound care consult ordered. Bed alarm on, call bell within reach. Safety maintained.Valuables documented and stored at bedside.

## 2022-01-04 NOTE — Plan of Care
Plan of Care Overview/ Patient Status    All pertinent data is in my head to assessment in the flow sheet sectionProblem: Adult Inpatient Plan of CareGoal: Plan of Care ReviewOutcome: Interventions implemented as appropriateGoal: Patient-Specific Goal (Individualized)Outcome: Interventions implemented as appropriateGoal: Absence of Hospital-Acquired Illness or InjuryOutcome: Interventions implemented as appropriateGoal: Optimal Comfort and WellbeingOutcome: Interventions implemented as appropriateGoal: Readiness for Transition of CareOutcome: Interventions implemented as appropriate Problem: Impaired Wound HealingGoal: Optimal Wound HealingOutcome: Interventions implemented as appropriate Problem: Skin Injury Risk IncreasedGoal: Skin Health and IntegrityOutcome: Interventions implemented as appropriate Problem: Violence Risk or ActualGoal: Anger and Impulse ControlOutcome: Interventions implemented as appropriate Problem: Fall Injury RiskGoal: Absence of Fall and Fall-Related InjuryOutcome: Interventions implemented as appropriate

## 2022-01-04 NOTE — H&P
Patient seen and examined with Dr. Alfredo Martinez with assessment and planIn brief this is 88 year old Caucasian male past medical history of paroxysmal atrial fibrillation not on anticoagulation hypertension hypothyroidism depression hyperlipidemia reflux diabetes no Rx status post intraparenchymal hemorrhage in 2019, dementia, lives at [facility] assisted [facility] 24 hour aides vaccinated for COVID-19, status post recent admission for lethargy tachypnea and hypoxemia most probably due to aspiration status post ceftriaxone and azithromycin and recommended dysphagia diet with puree and nectar thick liquids sent to nursing home portion of [place] for rehab and brought in by ambulance for hypotension and hypoxia status post 2 liters bolus by EMS and in the ER patient is minimally verbal at time of interview, on high-flow saturations 98%Temperature 99.6 pulse 106 respirations 42 blood pressure was 93/78 now 140s status post bolus First and second heart sounds tachycardia arrhythmia Right crepitations Abdomen is soft nontender no Extremities no DVT Neurology moves 4 limbs unable to blow full neuro examSkin exam pending H&H was 12 now 12.2, white count 10.73  55 platelets Sodium 141 potassium 5.8, history of hyperkalemia in the past, blood glucose 158 A1c 5.9 troponin 1st set 75 now 70Urinalysis shows white blood cells only no nitrites EKG shows atrial flutter left axis deviation poor R-wave progression inferior Q-waves no acute new changesNote normal EF the end of last yearChest x-ray right-sided pneumonia upper lobePlan admit team tele please call ID about antibiotics empirically give meropenem and vancomycin pending ID approval as multiple drug allergies, NS 100 cubic centimeters, NPO to repeat swallow eval, hold beta-blocker and alpha-blocker due to hypotension, continue high-flow, follow-up ABG, follow potassium in a.m., renal Lovenox, Tylenol for headache DNR

## 2022-01-04 NOTE — Utilization Review (ED)
UM Status: Meets Inpatient StatusAcute respiratory failure requiring on BiPAP. CXR w/ new/increased multifocal pneumonia. IVF. SDU admit.

## 2022-01-05 ENCOUNTER — Encounter
Admit: 2022-01-05 | Payer: PRIVATE HEALTH INSURANCE | Attending: Vascular and Interventional Radiology | Primary: Internal Medicine

## 2022-01-05 LAB — BASIC METABOLIC PANEL
BKR ANION GAP: 6 (ref 5–18)
BKR BLOOD UREA NITROGEN: 23 mg/dL (ref 8–25)
BKR BUN / CREAT RATIO: 27.1 — ABNORMAL HIGH (ref 8.0–25.0)
BKR CALCIUM: 8.2 mg/dL — ABNORMAL LOW (ref 8.4–10.3)
BKR CHLORIDE: 113 mmol/L (ref 95–115)
BKR CO2: 26 mmol/L (ref 21–32)
BKR CREATININE: 0.85 mg/dL (ref 0.50–1.30)
BKR EGFR, CREATININE (CKD-EPI 2021): >60 mL/min/{1.73_m2} (ref >=60)
BKR GLUCOSE: 123 mg/dL — ABNORMAL HIGH (ref 70–100)
BKR OSMOLALITY CALCULATION: 294 mosm/kg (ref 275–295)
BKR POTASSIUM: 4.4 mmol/L (ref 3.5–5.1)
BKR SODIUM: 145 mmol/L (ref 136–145)

## 2022-01-05 LAB — CBC WITH AUTO DIFFERENTIAL
BKR WAM ABSOLUTE IMMATURE GRANULOCYTES.: 0.06 x 1000/ÂµL (ref 0.00–0.30)
BKR WAM ABSOLUTE LYMPHOCYTE COUNT.: 1.56 x 1000/ÂµL (ref 0.60–3.70)
BKR WAM ABSOLUTE NRBC (2 DEC): 0 x 1000/ÂµL (ref 0.00–1.00)
BKR WAM ANALYZER ANC: 6.64 x 1000/ÂµL (ref 2.00–7.60)
BKR WAM BASOPHIL ABSOLUTE COUNT.: 0.02 x 1000/ÂµL (ref 0.00–1.00)
BKR WAM BASOPHILS: 0.2 % (ref 0.0–1.4)
BKR WAM EOSINOPHIL ABSOLUTE COUNT.: 0.03 x 1000/ÂµL (ref 0.00–1.00)
BKR WAM EOSINOPHILS: 0.3 % (ref 0.0–5.0)
BKR WAM HEMATOCRIT (2 DEC): 36.7 % — ABNORMAL LOW (ref 38.50–50.00)
BKR WAM HEMOGLOBIN: 11.2 g/dL — ABNORMAL LOW (ref 13.2–17.1)
BKR WAM IMMATURE GRANULOCYTES: 0.7 % (ref 0.0–1.0)
BKR WAM LYMPHOCYTES: 17.1 % (ref 17.0–50.0)
BKR WAM MCH (PG): 29.3 pg (ref 27.0–33.0)
BKR WAM MCHC: 30.5 g/dL — ABNORMAL LOW (ref 31.0–36.0)
BKR WAM MCV: 96.1 fL (ref 80.0–100.0)
BKR WAM MONOCYTE ABSOLUTE COUNT.: 0.81 x 1000/ÂµL (ref 0.00–1.00)
BKR WAM MONOCYTES: 8.9 % (ref 4.0–12.0)
BKR WAM MPV: 9.3 fL (ref 8.0–12.0)
BKR WAM NEUTROPHILS: 72.8 % — ABNORMAL HIGH (ref 39.0–72.0)
BKR WAM NUCLEATED RED BLOOD CELLS: 0 % (ref 0.0–1.0)
BKR WAM PLATELETS: 298 x1000/ÂµL (ref 150–420)
BKR WAM RDW-CV: 13.2 % (ref 11.0–15.0)
BKR WAM RED BLOOD CELL COUNT.: 3.82 M/ÂµL — ABNORMAL LOW (ref 4.00–6.00)
BKR WAM WHITE BLOOD CELL COUNT: 9.1 x1000/ÂµL (ref 4.0–11.0)

## 2022-01-05 LAB — PHOSPHORUS     (BH GH L LMW YH)
BKR PHOSPHORUS: 3 mg/dL (ref 2.5–4.9)
BKR PHOSPHORUS: 3.2 mg/dL (ref 2.5–4.9)

## 2022-01-05 LAB — MAGNESIUM
BKR MAGNESIUM: 1.8 mg/dL (ref 1.4–2.2)
BKR MAGNESIUM: 2 mg/dL (ref 1.4–2.2)

## 2022-01-05 LAB — URINE CULTURE: BKR URINE CULTURE, ROUTINE: NO GROWTH

## 2022-01-05 LAB — C-REACTIVE PROTEIN     (CRP): BKR C-REACTIVE PROTEIN: 9.9 mg/dL — ABNORMAL HIGH (ref 0.0–1.0)

## 2022-01-05 LAB — PROCALCITONIN     (BH GH LMW Q YH): BKR PROCALCITONIN: 0.06 ng/mL

## 2022-01-05 MED ORDER — ENOXAPARIN 40 MG/0.4 ML SUBCUTANEOUS SYRINGE
40 mg/0.4 mL | SUBCUTANEOUS | Status: DC
Start: 2022-01-05 — End: 2022-01-05

## 2022-01-05 MED ORDER — METOPROLOL TARTRATE 5 MG/5 ML IV
5 mg/ mL | Freq: Four times a day (QID) | INTRAVENOUS | Status: DC
Start: 2022-01-05 — End: 2022-01-05

## 2022-01-05 MED ORDER — ZINC OXIDE 40 % TOPICAL OINTMENT
40 % | TOPICAL | Status: DC | PRN
Start: 2022-01-05 — End: 2022-01-05

## 2022-01-05 MED ORDER — LEVOTHYROXINE 20 MCG/ML INTRAVENOUS SOLUTION
20 mcg/mL | Freq: Every day | INTRAVENOUS | Status: DC
Start: 2022-01-05 — End: 2022-01-07
  Administered 2022-01-06: 15:00:00 20 mL via INTRAVENOUS

## 2022-01-05 MED ORDER — ENOXAPARIN 40 MG/0.4 ML SUBCUTANEOUS SYRINGE
40 mg/0.4 mL | SUBCUTANEOUS | Status: DC
Start: 2022-01-05 — End: 2022-01-07
  Administered 2022-01-05 – 2022-01-07 (×2): 40 mg/0.4 mL via SUBCUTANEOUS

## 2022-01-05 MED ORDER — VANCOMYCIN 1 G IN 250 ML IVPB (VIALMATE)
INTRAVENOUS | Status: DC
Start: 2022-01-05 — End: 2022-01-06
  Administered 2022-01-06: 18:00:00 250.000 mL/h via INTRAVENOUS

## 2022-01-05 MED ORDER — METOPROLOL TARTRATE 5 MG/5 ML IV
5 mg/ mL | Freq: Four times a day (QID) | INTRAVENOUS | Status: DC
Start: 2022-01-05 — End: 2022-01-07
  Administered 2022-01-05 – 2022-01-07 (×6): 5 mL via INTRAVENOUS

## 2022-01-05 MED ORDER — ZINC OXIDE 40 % TOPICAL OINTMENT
40 % | Freq: Two times a day (BID) | TOPICAL | Status: DC
Start: 2022-01-05 — End: 2022-01-07
  Administered 2022-01-06 – 2022-01-07 (×3): 40 % via TOPICAL

## 2022-01-05 NOTE — H&P
[facility]	Medicine Housestaff History & PhysicalAttending Provider: Cindra Presume, *History provided by: adult child(ren) and EMR reviewHistory limited by: the condition of the patientHPI: CC: AMSHPI: 88 y.o. male PMHx of HTN, Afib (Not on AC), T2DM, Dementia, Glaucoma, Macular Degeneration, and BPH with altered mental status from [place]. Prior to BIBA from [place], the patient was noted to be febrile, tachycardic, and hypoxic to 90% (requiring 4L NC). When brought to [facility] ED, the patient was poorly responsive. While in ED, patient denied chest pain, abdominal pain or nausea. ROS was unable to be obtained by patient due to lethargy. No Recent sick contacts, though patient has a lot of aides that come and go. At baseline, mental status can be variable (sometimes oriented, sometimes disoriented). The patient was last admitted to [facility] for UTI 2/2 to urinary retention. Social History:Alcohol: DeniedTobacco: NeverDrugs: DeniedThe patient lives at [place] and uses a walker to get around. ER Course: Vitals on arrival: T99.8 F, BP 159/65, HR100, SpO2 97% on 2L, RR 40. Labs notable for Na 148, Cr 1.56, Glucose 162, Albumin 2.8, pro-BNP 13796, Troponin 62 -> 68, Lactate 2.7, WBC 11.9, ABG: 7.42/39/107/24.7/98%UA Cloudy, 2+ Protein, 1+ Blood, 4+ Leukocytes, Rare Bacteria, 606 WBCs, 6 RBCsEKG showed Accelerated Junctional Rhythm, Left Axis Deviation, HR 92 bpm. CXR with opacities of the left mid and right lower lung zones concerning for pneumonia; moderate pleural effusion. The patient was given Azithromycin 500 mg IV, Ceftriazone 1g, 1L NSHome Medications: Medications Prior to Admission Medication Sig Dispense Refill Last Dose ? acetaminophen (TYLENOL) 325 mg tablet Take 2 tablets (650 mg total) by mouth every 6 (six) hours as needed (Pain/Fever). 100 tablet 5  ? azelastine (OPTIVAR) 0.05 % ophthalmic solution Place 1 drop into both eyes daily. 6 mL 5 12/16/2021 at 1055 ? cholecalciferol, vitamin D3, 125 mcg (5,000 unit) capsule Take 1 capsule (5,000 Units total) by mouth. In the morning every Tuesday and Friday.   12/16/2021 at 1054 ? escitalopram oxalate (LEXAPRO) 10 mg tablet Take 1 tablet (10 mg total) by mouth at bedtime.   12/16/2021 at 1958 ? finasteride (PROSCAR) 5 mg tablet Take 1 tablet (5 mg total) by mouth daily. 30 tablet 5 12/16/2021 at 1055 ? latanoprost (XALATAN) 0.005 % ophthalmic solution Place 1 drop into both eyes nightly.   12/16/2021 at 1958 ? levothyroxine (SYNTHROID, LEVOTHROID) 150 mcg tablet Take 1 tablet (150 mcg total) by mouth every morning. On an empty stomach, at least 30 minutes before breakfast.   12/16/2021 at 1054 ? magnesium hydroxide (MILK OF MAGNESIA) 400 mg/5 mL suspension Take 30 mLs by mouth daily as needed for constipation.    ? melatonin 1 mg tablet Take 1 tablet (1 mg total) by mouth nightly.   12/16/2021 at 1958 ? metoprolol succinate XL (TOPROL-XL) 25 mg 24 hr tablet Take 0.5 tablets (12.5 mg total) by mouth daily. Take with or immediately following a meal. 15 tablet 5 12/16/2021 at 1054 ? OLANZapine (ZYPREXA) 2.5 mg tablet Take 1 tablet (2.5 mg total) by mouth nightly. 30 tablet 5 12/16/2021 at 1054 ? omeprazole (PRILOSEC) 20 mg capsule Take 1 capsule (20 mg total) by mouth 2 times daily (0900, 1700). 30 capsule 5 12/16/2021 at 1751 ? polyethylene glycol (MIRALAX) 17 gram packet Take 1 packet (17 g total) by mouth daily. Mix in 8 ounces of fluid 30 each 5 12/16/2021 at 1054 ? pravastatin (PRAVACHOL) 20 mg tablet Take 1 tablet (20 mg total) by mouth daily with dinner. 30 tablet  5 12/16/2021 at 1751 ? propylene glycoL (SYSTANE BALANCE) 0.6 % ophthalmic solution Place 1 drop into both eyes 3 (three) times daily.   12/16/2021 at 1152 ? tamsulosin (FLOMAX) 0.4 mg 24 hr capsule Take 1 capsule (0.4 mg total) by mouth 2 (two) times daily.   12/16/2021 at 1732 ? timolol (TIMOPTIC) 0.5 % ophthalmic solution Place 1 drop into both eyes daily. 10 mL 5 12/16/2021 at 1054 ? doxycycline hyclate (VIBRAMYCIN) 100 mg capsule Take 1 capsule (100 mg total) by mouth 2 (two) times daily. (Patient not taking: Reported on 07/29/2021) 5 capsule 0 Not Taking ? escitalopram oxalate (LEXAPRO) 10 mg tablet Take 1 tablet (10 mg total) by mouth at bedtime.    ? levothyroxine (SYNTHROID, LEVOTHROID) 175 mcg tablet Take 1 tablet (175 mcg total) by mouth every morning. (Patient not taking: Reported on 12/17/2021) 30 tablet 5 Not Taking Allergies as of 12/17/2021 - Review Complete 12/17/2021 Allergen Reaction Noted ? Gluten protein Other (See Comments) 10/29/2013 ? Lactose Other (See Comments) 10/29/2013 ? Avelox [moxifloxacin]  02/16/2016 ? Ciprofloxacin  11/10/2018 ? Nuts [peanut]  02/16/2016 ? Penicillins  10/28/2013 ? Sulfa (sulfonamide antibiotics)  10/28/2013 Objective: Vitals:Temp:  [97.9 ?F (36.6 ?C)-99.8 ?F (37.7 ?C)] 97.9 ?F (36.6 ?C)Pulse:  [99-109] 99Resp:  [20-48] 20BP: (114-159)/(65-85) 146/74SpO2:  [85 %-98 %] 95 %Device (Oxygen Therapy): nasal cannulaO2 Flow (L/min):  [2] 2 Weight: 59.4 kg Physical Exam:Gen: Lethargic, NADEyes: PERRL, EOMI, anicteric scleraENMT: OP clear w/o exudate, MMM, no oral sores, Sparse dentition. Neck: Supple, no JVDCV: nl S1 and S2,Tachycardic, Irregularly Irregular, no m/r/gPulm: CTAB, no wheezes, rales or ronchi. Breathing comfortably. Poor Breathing Effort. GI: Soft, Mild Tenderness throughout (though difficult to assess due to condition of patient), ND, +BS. No guarding or rebound tenderness. Ext: No peripheral edema, WWP. Tight muscle noted on left calf. Neuro: A&O x 0-1, EOM intact, face symmetric, Responsive to name. Psychiatric: Normal mood and affectSkin: No rashes Labs: Recent Labs Lab 02/15/230920 WBC 11.9* HGB 13.2 HCT 39.70 PLT 171  Recent Labs Lab 02/15/230920 NEUTROPHILS 82.7*  Recent Labs Lab 02/15/230920 NA 148* K 3.9 CL 115 CO2 25 BUN 57* CREATININE 1.56* GLU 162* ANIONGAP 8  Recent Labs Lab 02/15/230920 CALCIUM 9.3  Recent Labs Lab 02/15/230920 ALT 16 AST 10 ALKPHOS 57 BILITOT 0.7 PROT 6.7 ALBUMIN 2.8*  Recent Labs Lab 02/15/230920 LABPROT 10.2 INR 0.92  Imaging: XR Chest PA or APResult Date: 2/15/2023AP CHEST: 12/17/2021. CLINICAL HISTORY: Malaise, Sepsis. COMPARISON: Chest radiograph 07/29/2021. FINDINGS: Lines/Tubes/Devices: None. Lungs: Apparent increase in interstitial and likely airspace opacities at the right lower lung zone. Additional patchy opacity at the left midlung zone Left basilar atelectasis, likely compressive. Pleura: Moderate left pleural effusion. No pneumothorax. Cardiomediastinal silhouette: Enlarged, unchanged Bones/soft tissues: Unchanged. Upper Abdomen: Unremarkable. Limited AP view- 1. Opacities the left mid and right lower lung zones concerning for pneumonia in the setting of sepsis. 2. Moderate left pleural effusion. [facility] Radiology Notify System Classification: Orange - Urgent without Follow-up Reported and Signed by:  Lonia Chimera, MD  Microbiology: - SARS-CoV-2 PCR and Influenza negative EKG Accelerated Junctional Rhythm, Left Axis Deviation, HR 92 bpm. Assessment & Plan: Attending Provider: Cindra Presume, MD [phone]: None89 y.o. male with past medical history notable for HTN, Afib (Not on AC), T2DM, Dementia, Glaucoma, Macular Degeneration, and BPH who presents with toxic metabolic encephalopathy likely 2/2 to bacterial pneumonia. The patient will be admitted for antibiotics and further medical management. # Toxic Metabolic Encephalopathy# PneumoniaCOVID, Influenza, and Respiratory Viral Panel Negative. LA 2.7, Procal  elevated at 4.27. WBC elevated at 11.9. CXR noted opacities the left mid and right lower lung zones concerning for pneumonia. ABG unremarkable. Hx of Dementia may be contributing to altered state. - c/w Ceftriaxone and Azithromycin- f/u Blood Cx- f/u Respiratory Cx- f/u Legionella + Strep Urine- f/u MRSA- f/u SLP, OK for bedside swallow if patient has dramatic improvement.- f/u repeat LA (2.7 in ED) - NPO- Suction PRN- Saturating well on 2L, titrate as tolerated- Holding medications that could alter mental status- Trend Fever Curve- On 1/2 NS at 75 cc/hr- f/u B12# AKI Likely Pre-renal in setting of sepsis. Cr 1.56 on admission. Baseline Cr 1- Bladder Scan q6H PRN + Straight Cath- Condom Cath in Place, OK for foley catheter if persistent- Monitor I&Os- Trend Renal Function- f/u Renal U/S- On 1/2 NS at 75 cc/hr# HypothyroidismTSH 0.328, T4 elevated at 1.55- Levothyroxine ordered as IV 75 mcg QD while NPO (home dose 150 mcg)# Hx of Afib # TachycardiaTachycardia likely in setting of sepsis. Patient is NOT on AC- Held PTA Toprol XL 12.5 mg QD given NPO- Lopressor 1.25 mg q6H while NPO. Adjust dosing as needed# Hx of T2DM- f/u A1c- Medium Dose ISS- Not on home medications for diabetes. # Chronic Medications- continued home medications	- PRN Tylenol	- Azelastine -> Ordered as Ketotifen 1 drop QD 	- Latanoprost 1 drop nightly	- Timolol 0.5% QD	- Pravastatin 20 mg QD -> ordered as Crestor 5 mg	- Tamsulosin 0.4 mg BID	- Miralax 17g PRN for constipation- held home medications	- Cholecalciferol D3 5000 units QD	- Lexapro 10 mg QD	- Zyprexa 2.5 mg QD- medications to be reconciledDIET: NPOFLUIDS: 1/2 NS at 75 cc/hrELECTROLYTES: Replete PRNPPX: Heparin 5000 TIDACCESS: 2xPIVDISPO: Admit to TeleCODE STATUS: DNR/DNIAppreciate excellent nursing care. Primary Emergency Contact: Sommer,[name], Home Phone: [phone] Signed:Dr. Roylene Reason, DO PGY-2Date: 2/15/2023Time: 1:30 PMPager # [facility] # [phone]

## 2022-01-05 NOTE — Progress Notes
[facility] Medicine Progress NoteAttending Provider: Jene Every, MD Subjective                                                                              Subjective: Interim History: -remains on high-flow NC-arousable, denies pain when askedReview of Allergies/Meds/Hx: Review of Allergies/Meds/Hx:I have reviewed the patient's: allergies and current scheduled medications Objective Objective: Vitals:Last 24 hours: Temp:  [97.3 ?F (36.3 ?C)-98.8 ?F (37.1 ?C)] 97.7 ?F (36.5 ?C)Pulse:  [61-112] 86Resp:  [18-28] 18BP: (132-187)/(68-109) 141/68SpO2:  [95 %-99 %] 97 %I/O's:Gross Totals (Last 24 hours) at 01/05/2022 1137Last data filed at 01/04/2022 1225Intake 531.42 ml Output -- Net 531.42 ml Procedures:Physical Exam Constitutional:     Appearance: elderly male; ill looking; on HF oxygen, mild tachypnea, somnolentHENT:    Head: Normocephalic and atraumatic. .Cardiovascular:    Rate and Rhythm: Normal rate. Pulmonary:    Comments: bilat rhonchi notedAbdominal:    General: here is no distension.    Palpations: Abdomen is soft.    Tenderness: There is no abdominal tenderness. Musculoskeletal:    Cervical back: Neck supple.    Right lower leg: No edema.    Left lower leg: No edema. Neurological:    Comments:  Awake, few verbal responses to questions, moving extremities has baseline moderate dementiaLabs:Last 24 hours: Recent Results (from the past 24 hour(s)) POCT Glucose  Collection Time: 01/04/22 11:53 AM Result Value Ref Range  Glucose, Meter 94 70 - 100 mg/dL POC Glucose (Fingerstick)  Collection Time: 01/04/22  5:15 PM Result Value Ref Range  Glucose, Meter 108 (H) 70 - 100 mg/dL Magnesium  Collection Time: 01/04/22  9:18 PM Result Value Ref Range  Magnesium 1.8 1.4 - 2.2 mg/dL Phosphorus  Collection Time: 01/04/22  9:18 PM Result Value Ref Range Phosphorus 3.0 2.5 - 4.9 mg/dL POCT Glucose  Collection Time: 01/05/22 12:02 AM Result Value Ref Range  Glucose, Meter 110 (H) 70 - 100 mg/dL C-reactive protein  Collection Time: 01/05/22  5:19 AM Result Value Ref Range  C-Reactive Protein 9.9 (H) 0.0 - 1.0 mg/dL Procalcitonin     (BH GH LMW Q YH)  Collection Time: 01/05/22  5:19 AM Result Value Ref Range  Procalcitonin 0.06 See Comment ng/mL Basic metabolic panel  Collection Time: 01/05/22  5:19 AM Result Value Ref Range  Sodium 145 136 - 145 mmol/L  Potassium 4.4 3.5 - 5.1 mmol/L  Chloride 113 95 - 115 mmol/L  CO2 26 21 - 32 mmol/L  Anion Gap 6 5 - 18  Glucose 123 (H) 70 - 100 mg/dL  BUN 23 8 - 25 mg/dL  Creatinine 1.61 0.96 - 1.30 mg/dL  Calcium 8.2 (L) 8.4 - 10.3 mg/dL  BUN/Creatinine Ratio 04.5 (H) 8.0 - 25.0  Osmolality Calculation 294 275 - 295 mOsm/kg  eGFR (Creatinine) >60 >=60 mL/min/1.67m2 CBC auto differential  Collection Time: 01/05/22  5:19 AM Result Value Ref Range  WBC 9.1 4.0 - 11.0 x1000/?L  RBC 3.82 (L) 4.00 - 6.00 M/?L  Hemoglobin 11.2 (L) 13.2 - 17.1 g/dL  Hematocrit 40.98 (L) 11.91 - 50.00 %  MCV 96.1 80.0 - 100.0 fL  MCH 29.3 27.0 - 33.0 pg  MCHC 30.5 (L) 31.0 - 36.0 g/dL  RDW-CV  13.2 11.0 - 15.0 %  Platelets 298 150 - 420 x1000/?L  MPV 9.3 8.0 - 12.0 fL  Neutrophils 72.8 (H) 39.0 - 72.0 %  Lymphocytes 17.1 17.0 - 50.0 %  Monocytes 8.9 4.0 - 12.0 %  Eosinophils 0.3 0.0 - 5.0 %  Basophil 0.2 0.0 - 1.4 %  Immature Granulocytes 0.7 0.0 - 1.0 %  nRBC 0.0 0.0 - 1.0 %  ANC(Abs Neutrophil Count) 6.64 2.00 - 7.60 x 1000/?L  Absolute Lymphocyte Count 1.56 0.60 - 3.70 x 1000/?L  Monocyte Absolute Count 0.81 0.00 - 1.00 x 1000/?L  Eosinophil Absolute Count 0.03 0.00 - 1.00 x 1000/?L  Basophil Absolute Count 0.02 0.00 - 1.00 x 1000/?L  Absolute Immature Granulocyte Count 0.06 0.00 - 0.30 x 1000/?L  Absolute nRBC 0.00 0.00 - 1.00 x 1000/?L Magnesium  Collection Time: 01/05/22  5:19 AM Result Value Ref Range  Magnesium 2.0 1.4 - 2.2 mg/dL Phosphorus  Collection Time: 01/05/22  5:19 AM Result Value Ref Range  Phosphorus 3.2 2.5 - 4.9 mg/dL POCT Glucose  Collection Time: 01/05/22  6:21 AM Result Value Ref Range  Glucose, Meter 118 (H) 70 - 100 mg/dL POCT Glucose  Collection Time: 01/05/22  7:51 AM Result Value Ref Range  Glucose, Meter 122 (H) 70 - 100 mg/dL POC Glucose (Fingerstick)  Collection Time: 01/05/22 11:22 AM Result Value Ref Range  Glucose, Meter 126 (H) 70 - 100 mg/dL Diagnostics:CXR: New/increased multifocal pneumonia most prominently at the right upper lobe.  Assessment Assessment: Assessment:88 yo male, resident at [facility], with PMH of AFib (not on A/C), DM (not on meds), HTN, BPH, anxiety, hypothyroidism, cavernous malformation, small traumatic intraparenchymal hemorrhage in 2019, memory loss, gait abnormality.Was in [facility] 2/15-2/21 from  home with lethargy, tachypnea, hypoxemia. Found to have bilateral pneumonia. Question sepsis given lethargy (encephalopathy), AKI (cr 1.5 from normal baseline), tachypnea and tachycardia. Improved with IVF, course of azithro/rocephin. Diet resumed and was on RA prior to transfer to [facility] for STR. On 3/4 pt got lethargic with tachypnea and was desaturating in low 80's so sent back to ED. In ED pt altered with hypoxia, soft BP, worsening infiltrate on CXR. Placed on HF oxygen, bolused with IVF, started on IV vanco and merem, made NPO and admitted to teleSuspect recurrent aspiration pneumonia Plan Plan: Recurrence of pneumonia with worsening infiltrates on CXR, acute resp failure, encephalopathy - likely from aspiration but started on vanco and meropenem  day 2 to cover for healthcare associated pathogens - on HF - NPO - got boluses in ED, now on D5W 75cc/h as mildly hypernatremic - trend WBC, procal, CRP, monitor culture?AKI- resolved with normal renal function.  Observe?Acute metabolic encephalopathy on the background of dementia; secondary to acute infection/pneumonia- Treat pneumonia- NPO so holding Lexapro 10mg  hs, Melatonin qhs and Zyprexa 2.5mg  qhs on discharge?HTN, HLD- holding Toprol 25mg  and Pravastatin 20mg  qd qd- ECHO (2/17) - EF 55-60%, normal RV size and function, RVSP severely elevated, mild-mod MR?DM type 2 - A1c 5.9; diet controlled; SSI ordered but can likely stop in a day or so?BPH - hold Proscar 5mg  qd and Flomax 0.4mg  bid; has high residuals at baseline ( at times 500-600) ?Hypothyroidism- takes L-thyroxine qd at home; Astelin NPO, give IV SynthroidTroponemia - trop around 70 and flat; likely demand ischemia in the setting of pneumonia/acute reps failureGiving IV Lopressor 1.25mg  q6hrs?FEN- NPO as very altered now; was on Pureed and nectars with feeding assist and aspiration precautions as rec by SLP eval on 2/17; SLP eval  once/if his mental status back to baseline?DVT ppx - Lovenox ordered, SCD's?Code status: DNR?prognosis is concerningPCP is Dr Sabino Niemann recurrent hospitalizations, age, comorbidities a likelihood that the patient will have recurrent aspiration pneumonias, he is appropriate for evaluation by palliative care Electronically Signed:[id number] Alano Blasco MD 14233/04/2022,

## 2022-01-05 NOTE — Plan of Care
Plan of Care Overview/ Patient Status    Pt received and maintained  on HFNC 60L and FIO2 40%.SpO2 98%.

## 2022-01-05 NOTE — Plan of Care
Plan of Care Overview/ Patient Status    Pt received on High Flow N/C 40% at 60 lpm. At 1100 High Flow was changed to 40% at 40 lpm. No other changes were made. Pt tolerated High Flow well with no adverse reactions noted.

## 2022-01-05 NOTE — Plan of Care
Plan of Care Overview/ Patient Status    Assumed care of patient at 0700. Patient is lethargic, arouses after repeated stimuli. Follows some commands. Unable to assess orientation. Speech is garbled.Patient afib on cardiac monitor with PVC's. HR 70s-1teens. SBP 130s-140s. No edema. Afebrile. PIV in place. IVF infusing per order. No edema. Patient remains on HFNC. Lungs are coarse. Patient with tenacious, yellow secretions. Orally suctioned as needed. O2 saturations > goal of 92%. Patient with repeat SLP. Remains NPO. POCT blood glucose monitoring as ordered. Incontinent of bowel. Care rendered. Patient with external catheter catheter. Urine yellow.

## 2022-01-05 NOTE — Other
Adult Speech and Language PathologySwallow Evaluation3/6/2023Patient Name:  Keith MargolinMR#:  [id number] Date of Birth:  [birth year]Therapist:  Gara Kroner, SLP SLP IP Adult Date of Visit/Evaluation - 01/05/22 0930    Date of Visit/Evaluation  Date of Visit / Treatment 01/05/22      SLP IP Adult General Information - 01/05/22 0930    General Information  Pertinent History of Current Problem 88 yo male, resident at [place], with PMH of AFib (not on A/C), DM (not on meds), HTN, BPH, anxiety, hypothyroidism, cavernous malformation, small traumatic intraparenchymal hemorrhage in 2019, memory loss, gait abnormality, recent admission to [facility] 2/15-2/21 from home 2/2 lethargy, tachypnea, hypoxemia. Pt now a/w recurrence of pneumonia with worsening infiltrates on CXR, acute reap failure, encephalopathy   - likely from aspiration but started on vanco and meropenem to cover for healthcare associated pathogens.   Prior Level of Functioning pt familiar to service. seen at bedside 12/2021 with diet recs for puree, nectar via tsp only. per paper chart, pt on puree/nectar at facility.      SLP IP Adult Pain/Comfort - 01/05/22 0930    Pain/Comfort  Location #1 - PreTreatment Rating (Numbers Scale) 0/10 - no pain   Posttreatment Rating (Numbers Scale) 0/10 - no pain      SLP IP Adult Oral/Motor Assessment - 01/05/22 0930    Oral/Motor Function  Labial Function Mild-Moderate Impairment   Labial ROM Reduced right;Reduced left;Weak pucker;Poor labial retraction   Labial Strength Reduced   Labial Function Comments movement judged  more functional during eating task.   Lingual Function Moderate Impairment   Lingual ROM Reduced left   Lingual Strength Reduced   Lingual Function Comments unable to protrude on command   Facial Comments slight open mouth posture at rest   Dentition Poor dental/oral hygiene   Laryngeal Elevation Impaired SLP IP Adult Clinical/Bedside Swallow Evaluation - 01/05/22 0930    Clinical / Bedside Swallow Evaluation  Type of Study Initial   Reason for Study aspiration risk;concern for aspiration risk   Patients Current Diet NPO   Patient Position Bed   Patient Respiratory Status during evaluation - O2 Delivery HFNC        O2 Rate 41.6% at 15 LPM        Baseline Breath/Vocal Quality Wet        Other audible congestion during respiration   Puree yes        Delivery Spoon        Volume 1/2 tsp applesauce        Oral Phase Adequate        Pharyngeal Phase Overt signs/symptoms of aspiration/difficulty        Overt Signs / Symptoms of Aspiration / Difficulty Cough;Wet vocal quality;Increased work of breathing   Clinical Impression Suspected pharyngeal dysphagia   Clinical Impression Based On: Wet vocal quality;Increased work of breathing;Cough   Patient should be NPO Yes, with strict oral care   Aspiration Precautions yes        Precautions include: Suction set-up to bedside;Oral care 3-4 times/daily with a toothbrush;Maintain HOB >30 degrees   Oral Care Recommendations 4x/day;3x/day   Other (Comments) pt with chronic dysphagia demonstrates exacerbation of swallow dysfunction in setting of respiratory failure, deconditioning and advanced age. limited PO trials administered d/t aspiration risk. oral care required prior to PO trials d/t thick secretions in the oral cavity. pt also with audible congestion during respiration and wet voice at baseline concerning for poor secretion management. pt given 1/2 tsp of applesauce. while  oral phase was unremarkable for deficits, there was a presumed delay in the swallow trigger, reduced hyolaryngeal elevation per palpation, and multiple swallows concerning for pharyngeal clearance issues. wet voice, increased WOB and cough noted after intake. pt left in NAD. ultimately, pts swallow deemed unsafe/inefficient for oral intake. would rec goals of care conversation for feeding given chronic nature of his dysphagia and poor prognosis given his age, concern for PNA and medical co morbidities.      SLP IP Adult Recommendations - 01/05/22 0930    Recommendations  SLP Therapy Frequency 1x per week      SLP IP Adult Inpatient Recommendations - 01/05/22 0930    SLP Recommendations for Inpatient Admission  Swallow Recommendations 1. NPO w/ goals of care discussion for feeding 2. oral care 3-4x daily     Gara Kroner MA [facility] SLPMHB

## 2022-01-05 NOTE — Other
Inpatient Wound ConsultDate: 01/05/2022  JYN:WGNFAO Keith Strickland is a 88 y.o. male admitted with respiratory failure. We are consulted Subjective:Patient is lethargic but does cry out in pain when positioned.Physical Exam:Skin is pale and patient is very stiff and difficult to move. There is chronic discoloration of the skin over the sacrum and nonblanchable purple-redness over the bony prominences. There is redness over the thoracic portion of the back but it is blanchable and not specific to pressure points. There is a purple/maroon area of discoloration over the bony lumbar spine which does not blanch. Skin is intact. Heels are red and blanchable.  Assessment:Sacrum: Deep tissue pressure injuryLumbar spine: Suspected deep tissue injuryRecommendations:Position from side to side and apply 40% zinc to sacrumApply bordered foam to wound on lumbar spine. Peel up and visualize the wound daily.

## 2022-01-05 NOTE — Progress Notes
[facility]	Progress Note  2Attending Provider: Jene Every, MDSubjective: 24 hour events:NSVT 16 beats last PM.  Otherwise NAEO - patient remains on HFNC (settings unchanged)Patient seen and assessed at bedside this morning. Patient is ill-appearing and lethargic, eyes shut- arouses and minimally opens eyes to verbal stimuli. Tells me that he's in the hospital. Moans and mumbles to other  questions. Denies headache, fever/cchills, chest pain, SOB, abdominal pain, n/v.Meds: Scheduled Meds:Current Facility-Administered Medications Medication Dose Route Frequency Provider Last Rate Last Admin ? enoxaparin (LOVENOX) syringe 40 mg  40 mg Subcutaneous Daily Collie Kittel, Carlean Jews, MD     ? insulin lispro (Admelog, HumaLOG) Sliding Scale (See admin instructions for dose) 1-18 Units  1-18 Units Subcutaneous Q6H Retalis, Cleora Fleet, DO     ? latanoprost (XALATAN) 0.005 % ophthalmic solution 1 drop  1 drop Both Eyes Nightly Retalis, Cleora Fleet, DO   1 drop at 01/04/22 2228 ? [START ON 01/06/2022] levothyroxine (SYNTHROID) IV Push (Adults) - Syringe 75 mcg  75 mcg IV Push Daily Lazarus Gowda, MD     ? meropenem [facility]) 1 g in sodium chloride 0.9% 100 mL (mini-bag plus)  1 g Intravenous Q12H Marshell Levan, MD 33.33 mL/hr at 01/05/22 0937 1 g at 01/05/22 1610 ? metoprolol tartrate (LOPRESSOR) injection 1.25 mg  1.25 mg IV Push Q6H Diwan, Adnan, MD     ? sodium chloride 0.9 % flush 3 mL  3 mL IV Push Q8H Retalis, Cleora Fleet, DO   3 mL at 01/05/22 0641 ? vancomycin (VANCOCIN) 1 g in sodium chloride 0.9% 250 mL IVPB (vialmate)  1 g Intravenous Q36H Mutic, Marquita Palms, MD 250 mL/hr at 01/05/22 1155 1 g at 01/05/22 1155 ? zinc oxide 40 % ointment 1 Application  1 Application Topical (Top) 2 times daily Jene Every, MD     Continuous Infusions:? dextrose 5 % 75 mL/hr (01/04/22 1225) PRN Meds:dextrose (GLUCOSE) oral liquid 15 g **OR** fruit juice **OR** skim milk, dextrose (GLUCOSE) oral liquid 15 g **OR** fruit juice **OR** skim milk, dextrose (GLUCOSE) oral liquid 30 g **OR** fruit juice, dextrose (GLUCOSE) oral liquid 30 g **OR** fruit juice, dextrose injection, dextrose injection, glucagon, sodium chlorideObjective: Vitals:Temp:  [97.3 ?F (36.3 ?C)-98.8 ?F (37.1 ?C)] 97.7 ?F (36.5 ?C)Pulse:  [61-112] 86Resp:  [18-28] 18BP: (132-187)/(68-109) 141/68SpO2:  [95 %-99 %] 97 %Device (Oxygen Therapy): high-flow nasal cannula;humidified;heatedO2 Flow (L/min):  [60] 60 I/O's:Intake/Output Summary (Last 24 hours) at 01/05/2022 1340Last data filed at 01/05/2022 1159Gross per 24 hour Intake -- Output 275 ml Net -275 ml  Physical Exam:Gen: Ill appearing, lying in bed, HFNC in place HEENT: PERRL, anicteric, oropharynx benign, dry mucous membranes CV: tachycardic, irregularPulm: Bilateral ronchi/rales (R worse than L). Diffusely decreased air entry. Increased WOB on HFNC. GI: Soft, NT, ND, +BS. No guarding or rebound tenderness. Ext: No peripheral edema, WWPNeuro: Awake, lethargic. arouses to voice. Able to respond to questions appropriately intermittently.Skin: No rashesLabs: Labs:Recent Labs   03/04/231937 03/05/230043 03/05/230545 03/06/230519 NA 141 146* 147* 145 K 5.8* 4.6 4.3 4.4 CL 112 115 116* 113 CO2 27 25 26 26  BUN 27* 26* 24 23 CREATININE 1.17 1.04 0.88 0.85 Recent Labs   03/04/231937 03/05/230043 03/05/230545 03/05/232118 03/06/230519 CALCIUM 8.4 7.8* 7.9*  --  8.2* MG 1.9  --   --  1.8 2.0 PHOS 3.2  --   --  3.0 3.2 Recent Labs   03/06/230519 03/06/230621 03/06/230751 03/06/231122 GLU 123* 118* 122* 126* Recent Labs   03/04/231936 03/05/230545 03/06/230519 WBC 10.7 10.2 9.1 HGB 12.2* 10.7*  11.2* HCT 38.90 34.20* 36.70* PLT 355 277 298 MCV 94.9 95.8 96.1 RDWCV 13.4 13.3 13.2 Recent Labs   03/04/231937 ALT 18 AST 37 ALKPHOS 62 BILITOT 0.4 ALBUMIN 2.2* Recent Labs   03/04/231948 LABPROT 10.9 INR 0.99 Recent Labs   03/04/231936 LACTATE 1.7 Recent Labs   03/04/231947 03/06/230519 PROCALCITON 0.13 0.06 No results for input(s): TROPONINI, CKTOTAL in the last 72 hours.Recent Labs   03/05/230043 BNPPRO 9,175.0* No results for input(s): HGBA1C in the last 72 hours.No results for input(s): LIPASE in the last 72 hours.No results for input(s): AMMONIA in the last 72 hours.Urine Studies:No results for input(s): OSMOLALITYUR, SODIUMURR, ALBURR, PRCRRAU, PROTUR, CREATININEUR, UREAUR in the last 72 hours.Recent Labs   03/04/232009 CLARITYU Clear COLORU Yellow SPECGRAV 1.018 PHUR 6.0 PROTEINUA 1+* GLUCOSEU Negative KETONESU Negative BLOODU Negative BILIRUBINUR Negative LEUKOCYTESUR 3+* NITRITE Negative UROBILINOGEN <2.0 Imaging 24H:No results found.Echo:Results for orders placed or performed during the hospital encounter of 12/17/21 Echo 2D Complete w Doppler and CFI if Ind Image Enhancement 3D and or bubbles Result Value Ref Range  Reported Visual Range EF% 55-60 %  Narrative  ~ * Normal left ventricular size and systolic function. Mild concentric left ventricular hypertrophy.  LVEF estimated by visual assessment was between 55-60%.* Normal right ventricular cavity size, systolic function and wall motion.  Estimated right ventricular systolic pressure is 61 mmHg.  RV systolic pressure is severely elevated.* Atria are normal in size.* Mild aortic regurgitation.* Mild-moderate mitral regurgitation* Mild - moderate tricuspid regurgitation Cultures:Recent Labs Lab 03/04/231935 03/04/231946 LABBLOO No Growth to Date No Growth to Date Recent Labs Lab 03/04/232009 LABURIN No Growth No results for input(s): THROATCX in the last 168 hours.No results for input(s): SPNEUMONIAEU in the last 168 hours.No results for input(s): LEGIONELLAAG in the last 168 hours.No results for input(s): DEEPWOUNDCX in the last 168 hours.No results for input(s): AFBCULTURE in the last 168 hours.No results for input(s): AFBSTAIN in the last 168 hours.No results for input(s): AFBST in the last 168 hours.Recent Labs Lab 03/04/231947 INFLUENZAART Negative Recent Labs Lab 03/04/231947 INFLUENZBRT Negative  EKG: Results for orders placed or performed during the hospital encounter of 01/03/22 EKG Result Value Ref Range  Heart Rate 102 bpm  QRS Interval 118 ms  QT Interval 410 ms  QTC Interval 534 ms  P Axis    QRS Axis -57 deg  T Wave Axis 0 deg  P-R Interval    SEVERITY Abnormal ECG severity  Assessment & Plan: Attending Provider: Dr. Ronita Hipps?Consults: ID Dr. Eduardo Osier ?88 y.o. male with PMHx of hypertension, atrial fibrillation (not on a/c), type 2 diabetes (diet controlled), dementia, glaucoma, macular degeneration, and BPH presents with hypoxia and dyspnea found to have worsening bilateral pneumonia on imaging. On IV vanc/meropenem. Requiring HFNC. Prognosis guarded. ?#Acute hypoxemic respiratory failure 2/2 bilateral, multifocal pneumonia#Recent hospitalization#Sepsis 2/2 pulmonary sourceABG: 7.42/38/121/98 Influenza and COVID negativeViral panel negative- c/w Meropenem 1g q12h (3/4 - ) given PCN allergy and vancomycin (3/4- ), discussed with Dr. Eduardo Osier- s/p 2L NS bolus (1 by EMS, 1L in ED)- c/w IVF: switched to d5w iso hypernatremia to 147- f/u Lower respiratory culture 3/4: NGTD- f/u MRSA 3/4: pending- f/u BCx 3/4: NGTD - urine cultures : No growth- f/u urine legionella/strep- currently requiring HFNC, wean off as tolerated- Tylenol for fevers, trend inflammatory markers (wbc, procal, crp)	- crp: 12.5 -> 9.9	- procal: 0.13 -> 0.06	- wbc: 10.2 -> 9.1- Narrow abx pending respiratory cultures- Acapella and incentive spirometer as able- Suctioning for worsening secretions- SLP eval 3/6: NPO w/ GOC discussions for  feeding?#Elevated troponinLikely demand ischemia iso acute illness/infection- hs trop 75 - 70 - 70- monitor for now#hypernatremia -> resolved with d5wNa 147 -> 145- c/w D5w 75cc/hr for now?# HTN# HLD# hx afib. Recurrent rvr (3/6) likely iso of acute infection- Hold PTA Toprol and statin 2/2 NPO- ECHO (2/17) - EF 55-60%, normal RV size and function, RVSP severely elevated, mild-mod MR- start lopressor IV 1.25mg  q6hr?#DM type 2 - A1c 5.9; diet controlled- POCT glucose checks and SSI?#BPH - Holding PTA Proscar 5mg  qd and Flomax 0.4 mg bid as NPO- Bladder scans and straight cath if retaining?#Hypothyroidism- hold Levothyroxine 150 mcg qd po iso NPO- starting IV synthroid qd iso NPO# GOC- prognosis guarded- without prompting, family expressed wishes to continue to discuss GOC going forward (3/5)- family understands the poor prognosis and would like to discuss palliative consult/CMO approach if no improvements or decline in the next few days. - 3/6: discussed with daughter Reece Levy) at bedside. The 3 sisters would like to discuss comfort focused approach if patient fails SLP again. [name] understands that patient's prognosis is guarded, and even if he fights off the infection and passes swallow eval, risks of recurrent aspirations are very high. [name] endorsing comfort is the most important at this time- but would like to continue with IV abx and further SLP evals as patient appears comfortable to her at this time. - Palliative care consulted 3/6 for GOC clarification.?DIET: NPOFLUIDS: D5w 75cc/hrELECTROLYTES: Replete PRN PPX: Lovenox, SCDsACCESS: PIVDISPO: TelemetryCODE STATUS: No ACLS/BLS?Primary Emergency Contact: Sommer,[name], Home Phone: [phone] Signed:Sumedha Munnerlyn, MD, PGY-13/04/2022 Mobile Heartbeat : [phone]

## 2022-01-06 LAB — CBC WITH AUTO DIFFERENTIAL
BKR WAM ABSOLUTE IMMATURE GRANULOCYTES.: 0.06 x 1000/ÂµL (ref 0.00–0.30)
BKR WAM ABSOLUTE LYMPHOCYTE COUNT.: 1.43 x 1000/ÂµL (ref 0.60–3.70)
BKR WAM ABSOLUTE NRBC (2 DEC): 0 x 1000/ÂµL (ref 0.00–1.00)
BKR WAM ANALYZER ANC: 6.56 x 1000/ÂµL (ref 2.00–7.60)
BKR WAM BASOPHIL ABSOLUTE COUNT.: 0.03 x 1000/ÂµL (ref 0.00–1.00)
BKR WAM BASOPHILS: 0.3 % (ref 0.0–1.4)
BKR WAM EOSINOPHIL ABSOLUTE COUNT.: 0.02 x 1000/ÂµL (ref 0.00–1.00)
BKR WAM EOSINOPHILS: 0.2 % (ref 0.0–5.0)
BKR WAM HEMATOCRIT (2 DEC): 36.7 % — ABNORMAL LOW (ref 38.50–50.00)
BKR WAM HEMOGLOBIN: 11.5 g/dL — ABNORMAL LOW (ref 13.2–17.1)
BKR WAM IMMATURE GRANULOCYTES: 0.7 % (ref 0.0–1.0)
BKR WAM LYMPHOCYTES: 16.2 % — ABNORMAL LOW (ref 17.0–50.0)
BKR WAM MCH (PG): 29.3 pg (ref 27.0–33.0)
BKR WAM MCHC: 31.3 g/dL (ref 31.0–36.0)
BKR WAM MCV: 93.6 fL (ref 80.0–100.0)
BKR WAM MONOCYTE ABSOLUTE COUNT.: 0.74 x 1000/ÂµL (ref 0.00–1.00)
BKR WAM MONOCYTES: 8.4 % (ref 4.0–12.0)
BKR WAM MPV: 9.1 fL (ref 8.0–12.0)
BKR WAM NEUTROPHILS: 74.2 % — ABNORMAL HIGH (ref 39.0–72.0)
BKR WAM NUCLEATED RED BLOOD CELLS: 0 % (ref 0.0–1.0)
BKR WAM PLATELETS: 328 x1000/ÂµL (ref 150–420)
BKR WAM RDW-CV: 13 % (ref 11.0–15.0)
BKR WAM RED BLOOD CELL COUNT.: 3.92 M/ÂµL — ABNORMAL LOW (ref 4.00–6.00)
BKR WAM WHITE BLOOD CELL COUNT: 8.8 x1000/ÂµL (ref 4.0–11.0)

## 2022-01-06 LAB — BASIC METABOLIC PANEL
BKR ANION GAP: 6 (ref 5–18)
BKR BLOOD UREA NITROGEN: 23 mg/dL (ref 8–25)
BKR BUN / CREAT RATIO: 24 (ref 8.0–25.0)
BKR CALCIUM: 8.3 mg/dL — ABNORMAL LOW (ref 8.4–10.3)
BKR CHLORIDE: 110 mmol/L (ref 95–115)
BKR CO2: 25 mmol/L (ref 21–32)
BKR CREATININE: 0.96 mg/dL (ref 0.50–1.30)
BKR EGFR, CREATININE (CKD-EPI 2021): >60 mL/min/{1.73_m2} (ref >=60)
BKR GLUCOSE: 137 mg/dL — ABNORMAL HIGH (ref 70–100)
BKR OSMOLALITY CALCULATION: 287 mosm/kg (ref 275–295)
BKR POTASSIUM: 4.2 mmol/L (ref 3.5–5.1)
BKR SODIUM: 141 mmol/L (ref 136–145)

## 2022-01-06 LAB — ZZZMRSA CULTURE     (BH GH LM YH): BKR MRSA MEDIA: NEGATIVE

## 2022-01-06 LAB — C-REACTIVE PROTEIN     (CRP): BKR C-REACTIVE PROTEIN: 8.9 mg/dL — ABNORMAL HIGH (ref 0.0–1.0)

## 2022-01-06 MED ORDER — SENNOSIDES 8.6 MG TABLET
8.6 mg | Freq: Every day | ORAL | Status: DC
Start: 2022-01-06 — End: 2022-01-06

## 2022-01-06 MED ORDER — POLYETHYLENE GLYCOL 3350 17 GRAM ORAL POWDER PACKET
17 gram | Freq: Every day | ORAL | Status: DC
Start: 2022-01-06 — End: 2022-01-06

## 2022-01-06 MED ORDER — DEXTROSE 5 % AND 0.45 % SODIUM CHLORIDE INTRAVENOUS SOLUTION
INTRAVENOUS | Status: DC
Start: 2022-01-06 — End: 2022-01-07
  Administered 2022-01-06: 20:00:00 1000.000 mL/h via INTRAVENOUS

## 2022-01-06 MED ORDER — METOPROLOL TARTRATE 5 MG/5 ML IV
5 mg/ mL | Freq: Four times a day (QID) | INTRAVENOUS | Status: DC | PRN
Start: 2022-01-06 — End: 2022-01-07

## 2022-01-06 MED ORDER — BISACODYL 10 MG RECTAL SUPPOSITORY
10 mg | Freq: Every day | RECTAL | Status: DC | PRN
Start: 2022-01-06 — End: 2022-01-08

## 2022-01-06 MED ORDER — LORAZEPAM 2 MG/ML INJECTION SOLUTION
2 mg/mL | INTRAVENOUS | Status: DC | PRN
Start: 2022-01-06 — End: 2022-01-08
  Administered 2022-01-07 – 2022-01-08 (×8): 2 mL via INTRAVENOUS

## 2022-01-06 MED ORDER — HYDROMORPHONE 2 MG/ML INJECTION SOLUTION
2 mg/mL | INTRAVENOUS | Status: DC | PRN
Start: 2022-01-06 — End: 2022-01-08
  Administered 2022-01-07 – 2022-01-08 (×6): 2 mL via INTRAVENOUS

## 2022-01-06 MED ORDER — MINERAL OIL ENEMA
Freq: Once | RECTAL | Status: DC | PRN
Start: 2022-01-06 — End: 2022-01-07

## 2022-01-06 NOTE — Plan of Care
[facility]	Spiritual Care NotePurpose: Referral Source: Palliative Care Observation: People present/pt, private caregiverInformation Obtained From: Daughter Quality of Relational Support: Familiy In-Town and Involved Types of Relational Support: Caregiver, Daughter, Family Subjective: Extended visit at bedside accompanied by Dr. Tacy Dura, PC MD and Dr. Tyler Pita. Daughter Burnett Harry and private caregiver at bedside .Pt appeared to be comfortable and in no acute distress. He was however mostly uncommunicative. Daughter provided history. Pt has 3 daughters, all of whom are deeply devoted to pt. Pt is widowed since 2014 and had been married for 68 years. Daughter describes pt as being culturally Jewish and imparted moral and ethical values to his daughters. 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 He additionally is a [employer]. Pt enjoys music and poetry (daughter brought poetry to read to her father).Spiritual Assessment:Pt presents with many spiritual strengths which include a strong connection with devoted family (daughters and their respective familles and private caregiver for the past 13 years who is considered family). He has connection with his Jewish faith tradition and had a productive professional life. Pt additionally is comforted by music and poetry.Referral Source: Palliative Care Information Obtained From: Daughter Relational SupportTypes of Relational Support: Caregiver, Daughter, Science writer of Relational Support: Familiy In-Town and Involved Religious Affiliation: Jewish Spiritual Resources: Diplomatic Services operational officer, Spiritual Values, FamilyHelpful Religious Practices: Music  Issues RaisedRaised by Family: Goals, End-of-Life Decisions/Tasks Spiritual Interventions:Visit included family support, spiritual companionship, compassionate and empathetic presence, attentive and deep listening, facilitation of life review and the recitation of the [place] prayer for those who are ill ([place]) in both hebrew and english.Spiritual Intervention Index**Date of Spiritual Visit: 03/07/23Visit Type: ConsultIntervention Type: Spiritual VisitResponding Chaplain: Palliative CareConsulted With: Palliative CareReligious Needs: PrayerSpiritual/Religious Support Provided: Family Support, Companionship, Spiritual Support, Life Review, PrayerOutcome: OUTCOMES: Expressed Spiritual/Religious Resources or Distress: Expressed gratitude, Utilized spiritual resources/practices   OUTCOMES: Progressed Spiritually: Established chaplain relationship, Progressed toward trust OUTCOMES: Progressed Emotionally or Physically: Increased comfort  Plan: In collaboration with PC team, psycho- spiritual support available to pt and family.	Signed: Gypsy Decant, PhD/ PC Chaplain3/7/202310:52 AM

## 2022-01-06 NOTE — Plan of Care
Plan of Care Overview/ Patient Status    Problem: Physical Therapy GoalsGoal: Physical Therapy GoalsDescription: PT GOALS1. Patient will perform bed mobility with minimal assist2. Patient will perform transfers with minimal assist using appropriate assistive device 3. Patient will ambulate a minimum of 10 feet with minimal assist using appropriate assistive device  Outcome: Interventions implemented as appropriate Inpatient Physical Therapy Progress Note HPI/Precautions - 01/06/22 1515    Date of Visit / Treatment  Date of Visit / Treatment 01/06/22   Note Type Progress Note   Start Time 1502   Total Treatment Time 12 min    General Information  Subjective try   Referring Physician  Diwan   General Observations pt rec'd supine in bed, all lines in place, lethargic, briefly opened eyes and then eyes closed remainder of session   Precautions/Limitations Fall Precautions;Bed alarm;Chair alarm      Mobility - 01/06/22 1515    Bed Mobility  Bed Mobility Comments too lethargic to participate      PT Handoff - 01/06/22 1515    Handoff Documentation  Handoff Patient in bed;Bed alarm;Discussed with nursing      AMPAC Basic Mobility - 01/06/22 1515    PT- AM-PAC - Basic Mobility Screen- How much help from another person do you currently need.....  Turning from your back to your side while in a a flat bed without using rails? 1 - Total - Requires total assistance or cannot do it at all.   Moving from lying on your back to sitting on the side of a flat bed without using bed rails? 1 - Total - Requires total assistance or cannot do it at all.   Moving to and from a bed to a chair (including a wheelchair)? 1 - Total - Requires total assistance or cannot do it at all.   Standing up from a chair using your arms(e.g., wheelchair or bedside chair)? 1 - Total - Requires total assistance or cannot do it at all.   To walk in a hospital room? 1 - Total - Requires total assistance or cannot do it at all.   Climbing 3-5 steps with a railing? 1 - Total - Requires total assistance or cannot do it at all.   AMPAC Mobility Score 6   TARGET Highest Level of Mobility Mobility Level 2, Turn self in bed/bed activity/dependent transfer       Therapeutic Exercise - 01/06/22 1515    Therapeutic Exercise  Therapeutic Exercise Detailed Documentation Lower Extremity Exercises    Lower Extremity Exercises  Supine Exercises Heelslides;Hip abduction;Straight leg raise;Ankle pumps;Hip adduction;AAROM;PROM;10x each;Bilateral   pt lethargic, req increased verbal/tactile cues, intermittent AAROM     Clinical Impression - 01/06/22 1515    Clinical Impression  Rehab Diagnosis impaired functional mobility   Follow up Assessment Pt limited by lethargy, AAROM/PROM to bil LE's, progress as tol'd   Criteria for Skilled Therapeutic Interventions Met yes   Impairments Found (describe specific impairments) aerobic capacity/endurance;muscle strength;mobility;arousal   Functional Limitations in Following Categories (Describe Specific Limitations) mobility   Rehab Potential fair, will monitor progress closely   Demonstrates Need for Referral to Another Service social work      Patient/Family Stated Goals - 01/06/22 1515    Patient/Family Stated Goals  Patient/Family Stated Goal(s) unable to state      Frequency/Equipment Recommendations - 01/06/22 1515    Frequency/Equipment Recommendations  PT Frequency 3x per week   Equipment Needs During Admission/Treatment Other (comment)   mechanical lift  Recommendations for IP Admission - 01/06/22 1515    PT Recommendations for Inpatient Admission  Activity/Level of Assist out of bed;transfers only;assist of 2;mechanical lift      Planned Treatment/Interventions - 01/06/22 1515    Planned Treatment / Interventions  Plan for Next Visit progressive mobility   General Treatment / Interventions Aerobic Capacity / Endurance Conditioning;Therapeutic Exercise;Discharge Planning   Training Treatment / Interventions Strength Training;Functional Mobility Training   Education Treatment / Interventions Patient Education / Training      Discharge Summary - 01/06/22 1515    PT Discharge Summary  Physical Therapy Disposition Recommendation Short Term Rehab     Evelena Leyden, [place] TherapyGreenwich HospitalMobil Heart [phone]

## 2022-01-06 NOTE — Plan of Care
Plan of Care Overview/ Patient Status    A+Ox 1, disoriented to place, time, and situation. Pt lethargic between care but arouses to voice. Oral care provided PRN. Pt states, I'm so tired  On HFNL 40 % 40 L . Resting in bed assisted x2 while in bed. Meds taken per [facility]. BP (!) 166/71  - Pulse (!) 124  - Temp 98.8 ?F (37.1 ?C) (Oral)  - Resp 18  - Wt 66.9 kg  - SpO2 97%  - BMI 19.46 kg/m? No C/o cardiac or respiratory distress. No c/o pain or discomfort. Barrier cream applied to coccyx. Afib on TELE. BS monitored q 6 hours. Condom cath in place.   SLP this am- NPO to be continued per recs at this time. IVF D5 @ 67ml/hrABX continued.  1525- Called MD Park at bedside HR 139-142, pt diaphoretic, RR 28BP (!) 139/94  - Pulse (!) 138 Comment: Park MD at bedside - Temp 98.8 ?F (37.1 ?C) (Oral)  - Resp (!) 28 Comment: Park at bedside - Wt 66.9 kg  - SpO2 97%  - BMI 19.46 kg/m? Marland Kitchen Pt states he does not feel in distress. PRN Metop provided per orders. DTR refused labs, blood sugar checks, and any injections. Only allowed IV meds and IVF. Provider Park notified and [place]. DTR at bedside reading poetry this afternoon. Safety maintained, call bell within reach, rounding provided. Electronically Signed by Trecia Rogers, RN, January 06, 2022

## 2022-01-06 NOTE — Progress Notes
[facility] Re: Keith Strickland (1918-11-23)MRN: [id number] Date of service: 3/7/2023Date of admission: 3/4/2023Consultation requested by: Attending Provider: Jene Every, MD [phone] Reason for consultation: assistance with clarification of goals of carePalliative Care consult received. Chart reviewed. Full consult to follow. Family meeting planned for tomorrow at 11am.R. Tacy Dura, MD, FAAHPMMedical Director- [phone]/05/2022 12:39 PM

## 2022-01-06 NOTE — Plan of Care
Adult Speech and Language PathologySwallow Re-Evaluation3/7/2023Patient Name:  Keith MargolinMR#:  [id number] Date of Birth:  [birth year]Therapist:  Gwendalyn Ege, M.S. CCC-SLP SLP IP Adult Date of Visit/Evaluation - 01/06/22 1128    Date of Visit/Evaluation  Date of Visit / Treatment 01/06/22      SLP IP Adult General Information - 01/06/22 1128    General Information  Pertinent History of Current Problem 88 year old male, resident at [place], with PMHx of AFib (not on A/C), DM (not on meds), HTN, BPH, anxiety, hypothyroidism, cavernous malformation, small traumatic intraparenchymal hemorrhage in 2019, memory loss, gait abnormality.  Was in [facility] 2/15-2/21 from  home with lethargy, tachypnea, hypoxemia. Found to have bilateral pneumonia. Question sepsis given lethargy (encephalopathy), AKI (cr 1.5 from normal baseline), tachypnea and tachycardia. Improved with IVF, course of azithro/rocephin. Diet resumed and was on RA prior to transfer to [facility] for STR.      On 3/4 pt got lethargic with tachypnea and was desaturating in low 80's so sent back to ED. In ED pt altered with hypoxia, soft BP, worsening infiltrate on CXR. Placed on HF oxygen, bolused with IVF, started on IV vanco and merem, made NPO and admitted to tele     Suspect recurrent aspiration pneumonia. CXR 01/03/22 - IMPRESSION:  Limited AP view-  New/increased multifocal pneumonia most prominently at the right upper lobe.   Prior Level of Functioning Pt is familiar to our service. He was seen at bedside 12/2021 with diet recs for puree/nectar via tsp only. Recommended NPO during clinical swallow eval yesterday 01/05/22      SLP IP Adult Clinical/Bedside Swallow Evaluation - 01/06/22 1128    Clinical / Bedside Swallow Evaluation  Type of Study Repeat   Reason for Study aspiration risk;family request;weak and de-conditioned   Patients Current Diet NPO   Patient Position Bed;Upright   Patient Respiratory Status during evaluation - O2 Delivery HFNC        Breathing Pattern Increased WOB;Accessory Muscle Use;Nasal        Baseline Coughing Absent        Baseline Throat Clearing Absent        Baseline Breath/Vocal Quality Wet        Other hyphonic voice when prompted to vocalize   Ice Chips yes        Delivery Spoon;Fed by examiner        Volume 2 small, single ice chips        Oral Phase able to strip bolus from spoon;increased oral transit time        Pharyngeal Phase Overt signs/symptoms of aspiration/difficulty        Overt Signs/Symptoms of Aspiration / Difficulty Wet Vocal quality;Increased work of breathing   Nectar Thick Liquid yes        Delivery Spoon;Fed by examiner        Volume 1/4 teaspoon        Oral Phase Adequate;Able to strip bolus from spoon        Pharyngeal Phase Overt signs/symptoms of aspiration/difficulty        Overt Signs / Symptoms of Aspiration / Difficulty Wet vocal quality;Increased work of breathing;Multiple Swallows per bolus   Soft Solid no   Regular Solid no   Clinical Impression Suspected pharyngeal dysphagia;Moderate-Severe   Clinical Impression Based On: Wet vocal quality;Increased work of breathing;Reduced oral strength/coordination/sensation;Delay in swallow initiation   Contributing Factors Reduced alertness/attention   Predictors of Apsiration Pneumonia Dependence for oral care/feeding;Bedridden;Medical Complexity;Hx of dysphagia or reflux  Patient should be NPO Yes, with strict oral care   Aspiration Precautions yes        Precautions include: Maintain HOB >30 degrees;Suction set-up to bedside;Oral care 3-4 times/daily with a toothbrush   Oral Care Recommendations Mechanical brushing with toothbrush;Patient requires total assist for oral care;4x/day;Suction use   Other (Comments) Repeat swallow eval conducted at pt bedside following MD request to help guide family's goals of care/palliative consultation. Pt continues to present with mod-severe pharyngeal dysphagia characterized by wet vocal quality indicative of decreased swallow strength and function. He also remains on HFNC and demonstrates intermittent WOB during limited PO trials. Aspiration is imminent in pt's current condition. Pt's daughter and caregiver (of 13 years) aware of recommendations for NPO with ongoing GOC discussion with medical team and Palliative care. Dr. Sallee Lange also updated via [facility].       SLP IP Adult Recommendations - 01/06/22 1128    Recommendations  SLP Therapy Frequency No follow up necessary      SLP IP Adult Inpatient Recommendations - 01/06/22 1128    SLP Recommendations for Inpatient Admission  Swallow Recommendations Recommend: 1) Continue NPO unless per GOC, pt family willing to accept imminent risk of aspiration 2) Routine and rigorous oral care 3-4x/day with suction set up 3) Unless drastic improvement in alertness and respiratory condition occur, no further SLP re-eval warranted at this time

## 2022-01-06 NOTE — Plan of Care
[facility]		Spiritual Care NotePurpose:   Observation: People present/  Emotional         Subjective: Provided spiritual support in the form of spiritual presence, prayer and blessing. Pt's son was present at one point.Spiritual Assessment:                      Spiritual Interventions:Spiritual Intervention Index**Date of Spiritual Visit: 03/06/23Visit Type: Initial VisitLanguage or special accommodation rendered?: NoHow Well Do You Speak English?: wellIntervention Type: Spiritual VisitResponding Chaplain: RabbiConsulted With: NurseReligious Needs: PrayerSpiritual/Religious Support Provided: Spiritual Support, PrayerOutcome: OUTCOMES: Expressed Spiritual/Religious Resources or Distress: Expressed gratitude OUTCOMES: Expressed Emotional Resources or Distress: Relational resources identified OUTCOMES: Progressed Spiritually: Received desired ritual or practice OUTCOMES: Progressed Emotionally or Physically: Increased comfort     Plan: Will return at request of pt of family    	Signed: Rabbi MJ Newman3/6/20238:11 PMPlan of Care Overview/ Patient Status

## 2022-01-06 NOTE — Plan of Care
Plan of Care Overview/ Patient Status    Problem: Adult Inpatient Plan of CareGoal: Plan of Care ReviewOutcome: Interventions implemented as appropriate Pt received and remains on HFNC at 40lpm, fio2 40% Suction orally prn. Saturation 94-97%. Continue monitoring.

## 2022-01-06 NOTE — Plan of Care
Plan of Care Overview/ Patient Status    Pt received and maintained on nasal high flow device with a flow of 40L and 40% fio2.No changes made.Routine rounds also done.

## 2022-01-06 NOTE — Plan of Care
Plan of Care Overview/ Patient Status    Patient is alert/lethargic oriented x1-2, disoriented to time, situation and sometimes place.Does not open eyes, except for a few seconds after repeated requests. According to daughter patent can not see and does not open eyes much at baseline. Afib on the monitor. Patient had 16 beats v-tach at [address] MD Park made aware.HFNC 40% - 60LNPO. Think sputum, suction bedside - performed as needed. Mouth care completed.Condom catheter in place.Multiple foams to spine and coccyx.Bed alarm on, call bell within reach.

## 2022-01-06 NOTE — Progress Notes
[facility] Medicine Progress NoteAttending Provider: Jene Every, MD Subjective                                                                              Subjective: Interim History: -remains on high-flow NC 40L/m/40%-afebrile-more awake today, denies painReview of Allergies/Meds/Hx: Review of Allergies/Meds/Hx:I have reviewed the patient's: allergies and current scheduled medications Objective Objective: Vitals:Last 24 hours: Temp:  [97.7 ?F (36.5 ?C)-98.6 ?F (37 ?C)] 98.1 ?F (36.7 ?C)Pulse:  [75-118] 95Resp:  [18-24] 24BP: (108-147)/(68-92) 133/90SpO2:  [94 %-98 %] 96 %I/O's:Gross Totals (Last 24 hours) at 01/06/2022 1047Last data filed at 01/06/2022 0518Intake -- Output 825 ml Net -825 ml Procedures:Physical Exam Constitutional:     Appearance: elderly male; ill looking; on HF oxygen, mild tachypnea, somnolent but eyes openHENT:    Head: Normocephalic and atraumatic. .Cardiovascular:    Rate and Rhythm: Normal rate. Pulmonary:    Comments: bilat rhonchi notedAbdominal:    General: here is no distension.    Palpations: Abdomen is soft.    Tenderness: There is no abdominal tenderness. Musculoskeletal:    Cervical back: Neck supple.    Right lower leg: No edema.    Left lower leg: No edema. Neurological:    Comments:  Awake, few verbal responses to questions, moving extremities has baseline moderate dementiaLabs:Last 24 hours: Recent Results (from the past 24 hour(s)) POC Glucose (Fingerstick)  Collection Time: 01/05/22 11:22 AM Result Value Ref Range  Glucose, Meter 126 (H) 70 - 100 mg/dL POCT Glucose  Collection Time: 01/05/22 11:15 PM Result Value Ref Range  Glucose, Meter 117 (H) 70 - 100 mg/dL POCT Glucose  Collection Time: 01/06/22  6:10 AM Result Value Ref Range  Glucose, Meter 128 (H) 70 - 100 mg/dL Basic metabolic panel  Collection Time: 01/06/22  6:49 AM Result Value Ref Range  Sodium 141 136 - 145 mmol/L  Potassium 4.2 3.5 - 5.1 mmol/L  Chloride 110 95 - 115 mmol/L  CO2 25 21 - 32 mmol/L  Anion Gap 6 5 - 18  Glucose 137 (H) 70 - 100 mg/dL  BUN 23 8 - 25 mg/dL  Creatinine 1.61 0.96 - 1.30 mg/dL  Calcium 8.3 (L) 8.4 - 10.3 mg/dL  BUN/Creatinine Ratio 04.5 8.0 - 25.0  Osmolality Calculation 287 275 - 295 mOsm/kg  eGFR (Creatinine) >60 >=60 mL/min/1.69m2 CBC auto differential  Collection Time: 01/06/22  6:49 AM Result Value Ref Range  WBC 8.8 4.0 - 11.0 x1000/?L  RBC 3.92 (L) 4.00 - 6.00 M/?L  Hemoglobin 11.5 (L) 13.2 - 17.1 g/dL  Hematocrit 40.98 (L) 11.91 - 50.00 %  MCV 93.6 80.0 - 100.0 fL  MCH 29.3 27.0 - 33.0 pg  MCHC 31.3 31.0 - 36.0 g/dL  RDW-CV 47.8 29.5 - 62.1 %  Platelets 328 150 - 420 x1000/?L  MPV 9.1 8.0 - 12.0 fL  Neutrophils 74.2 (H) 39.0 - 72.0 %  Lymphocytes 16.2 (L) 17.0 - 50.0 %  Monocytes 8.4 4.0 - 12.0 %  Eosinophils 0.2 0.0 - 5.0 %  Basophil 0.3 0.0 - 1.4 %  Immature Granulocytes 0.7 0.0 - 1.0 %  nRBC 0.0 0.0 - 1.0 %  ANC(Abs Neutrophil Count) 6.56 2.00 - 7.60 x 1000/?L  Absolute  Lymphocyte Count 1.43 0.60 - 3.70 x 1000/?L  Monocyte Absolute Count 0.74 0.00 - 1.00 x 1000/?L  Eosinophil Absolute Count 0.02 0.00 - 1.00 x 1000/?L  Basophil Absolute Count 0.03 0.00 - 1.00 x 1000/?L  Absolute Immature Granulocyte Count 0.06 0.00 - 0.30 x 1000/?L  Absolute nRBC 0.00 0.00 - 1.00 x 1000/?L C-reactive protein  Collection Time: 01/06/22  6:49 AM Result Value Ref Range  C-Reactive Protein 8.9 (H) 0.0 - 1.0 mg/dL POCT Glucose  Collection Time: 01/06/22  7:48 AM Result Value Ref Range  Glucose, Meter 148 (H) 70 - 100 mg/dL Diagnostics:CXR: New/increased multifocal pneumonia most prominently at the right upper lobe.  Assessment Assessment: Assessment:88 yo male, resident at [facility], with PMH of AFib (not on A/C), DM (not on meds), HTN, BPH, anxiety, hypothyroidism, cavernous malformation, small traumatic intraparenchymal hemorrhage in 2019, memory loss, gait abnormality.Was in [facility] 2/15-2/21 from  home with lethargy, tachypnea, hypoxemia. Found to have bilateral pneumonia. Question sepsis given lethargy (encephalopathy), AKI (cr 1.5 from normal baseline), tachypnea and tachycardia. Improved with IVF, course of azithro/rocephin. Diet resumed and was on RA prior to transfer to [facility] for STR. On 3/4 pt got lethargic with tachypnea and was desaturating in low 80's so sent back to ED. In ED pt altered with hypoxia, soft BP, worsening infiltrate on CXR. Placed on HF oxygen, bolused with IVF, started on IV vanco and merem, made NPO and admitted to teleSuspect recurrent aspiration pneumonia Plan Plan: Recurrence of pneumonia with worsening infiltrates on CXR, acute resp failure, encephalopathy - likely from aspiration but started on vanco and meropenem  day 3 to cover for healthcare associated pathogens - on high-flow O2 Cipro - NPO - got boluses in ED, now on D5W 75cc/h  - trend WBC, procal, CRP, monitor culture?AKI- resolved with normal renal function.  Observe?Acute metabolic encephalopathy on the background of dementia; secondary to acute infection/pneumonia- Treat pneumonia- NPO so holding Lexapro 10mg  hs, Melatonin qhs and Zyprexa 2.5mg  qhs on discharge?HTN, HLD- holding Toprol 25mg  and Pravastatin 20mg  qd qd- ECHO (2/17) - EF 55-60%, normal RV size and function, RVSP severely elevated, mild-mod MR?DM type 2 - A1c 5.9; diet controlled; SSI ordered ?BPH - hold Proscar 5mg  qd and Flomax 0.4mg  bid; has high residuals at baseline ( at times 500-600) ?Hypothyroidism- takes L-thyroxine qd at home; Astelin NPO, give IV SynthroidTroponemia - trop around 70 and flat; likely demand ischemia in the setting of pneumonia/acute reps failureGiving IV Lopressor 1.25mg  q6hrs?FEN- NPO , was on Pureed and nectars with feeding assist and aspiration precautions-- repeat swallow eval performed on 03/06 noted.  Did poorly with recurrent signs of aspiration.  Family requesting repeat eval although suspect you will continue to fail.  THE PURPOSE OF THE EVAL IS TO HELP GUIDE THE FAMILY IN TRANSITIONING HIM TO PALLIATIVE CARE/HOSPICE-family also would not want any types of artificial feeding including Cortrak or PEG?DVT ppx - Lovenox ordered, SCD's?Code status: DNRprognosis is POORPCP is Dr Sabino Niemann recurrent hospitalizations, age, comorbidities a likelihood that the patient will have recurrent aspiration pneumonias and respiratory failure, he is appropriate for evaluation by palliative care.Remain on tele due to need for high-flow Electronically Signed:[id number] Presley Summerlin MD 14233/05/2022,

## 2022-01-06 NOTE — Plan of Care
Plan of Care Overview/ Patient Status    Patient is alert & lethargic, oriented x1-2, disoriented to time and situation.HFNC 40% 40LThick oral secretions. Mouth care completed throughout night, oral suctioning performed overnight as needed.NPO - for aspiration risk. Failed SLP 3/6. [place] catheter in place. Turned and positioned from side to side throughout shift. Zinc paste applied to sacrum. Foam applied to would on lumbar spine. Bed alarm, call bell within reach.

## 2022-01-06 NOTE — Progress Notes
[facility]	Progress Note  3Attending Provider: Jene Every, MDSubjective: 24 hour events:No VTachs overnight (had NSVT x 16 beats 2 nights before). In afib with rates ranging from 80-120s. No acute events overnight. Continues to require HFNC at 40%/40LPM.Ongoing GOC conversation. Palliative care consulted yesterday for help with GOC conversation - family wants to focus on comfort, but would like to continue full medical measures at this time; but refusing finger sticks to provide comfort. Family meeting tomorrow at 11am with palliative team (Dr. Payton Emerald) to clarify GOC.  Patient seen and assessed at bedside this morning. Patient is ill-appearing and lethargic, eyes shut. Appears to be slightly less lethargic and more responsive today. Able to follow some commands. Able to tell me that hes in a hospital. Intermittently moans to other questions. Eyes closed throughout encounter. Denies acute complaints at this time. Denies headache, fever/chills, chest pain, SOB, abdominal pain, n/v.Meds: Scheduled Meds:Current Facility-Administered Medications Medication Dose Route Frequency Provider Last Rate Last Admin ? enoxaparin (LOVENOX) syringe 40 mg  40 mg Subcutaneous Daily Amyriah Buras, Carlean Jews, MD   40 mg at 01/05/22 1739 ? insulin lispro (Admelog, HumaLOG) Sliding Scale (See admin instructions for dose) 1-18 Units  1-18 Units Subcutaneous Q6H Retalis, Cleora Fleet, DO     ? latanoprost (XALATAN) 0.005 % ophthalmic solution 1 drop  1 drop Both Eyes Nightly Retalis, Cleora Fleet, DO   1 drop at 01/05/22 2233 ? levothyroxine (SYNTHROID) IV Push (Adults) - Syringe 75 mcg  75 mcg IV Push Daily Lazarus Gowda, MD   75 mcg at 01/06/22 0102 ? meropenem (MERREM) 1 g in sodium chloride 0.9% 100 mL (mini-bag plus)  1 g Intravenous Q12H Marshell Levan, MD 33.33 mL/hr at 01/06/22 0945 1 g at 01/06/22 0945 ? metoprolol tartrate (LOPRESSOR) injection 1.25 mg  1.25 mg IV Push Q6H Jene Every, MD   1.25 mg at 01/06/22 0939 ? sodium chloride 0.9 % flush 3 mL  3 mL IV Push Q8H Retalis, Cleora Fleet, DO   3 mL at 01/06/22 0631 ? vancomycin (VANCOCIN) 1 g in sodium chloride 0.9% 250 mL IVPB (vialmate)  1 g Intravenous Q24H Kenlynn Houde, Carlean Jews, MD 250 mL/hr at 01/06/22 1251 1 g at 01/06/22 1251 ? zinc oxide 40 % ointment 1 Application  1 Application Topical (Top) 2 times daily Jene Every, MD   1 Application at 01/06/22 0940 Continuous Infusions:? dextrose 5 % 75 mL/hr (01/06/22 0130) PRN Meds:dextrose (GLUCOSE) oral liquid 15 g **OR** fruit juice **OR** skim milk, dextrose (GLUCOSE) oral liquid 15 g **OR** fruit juice **OR** skim milk, dextrose (GLUCOSE) oral liquid 30 g **OR** fruit juice, dextrose (GLUCOSE) oral liquid 30 g **OR** fruit juice, dextrose injection, dextrose injection, glucagon, metoprolol tartrate, sodium chlorideObjective: Vitals:Temp:  [97.9 ?F (36.6 ?C)-98.8 ?F (37.1 ?C)] 98.8 ?F (37.1 ?C)Pulse:  [75-124] 124Resp:  [18-24] 18BP: (108-166)/(69-92) 166/71SpO2:  [94 %-98 %] 97 %Device (Oxygen Therapy): high-flow nasal cannulaO2 Flow (L/min):  [40] 40 I/O's:Intake/Output Summary (Last 24 hours) at 01/06/2022 1358Last data filed at 01/06/2022 0518Gross per 24 hour Intake -- Output 550 ml Net -550 ml  Physical Exam:Gen: Ill appearing, lying in bed, HFNC in place HEENT: PERRL, anicteric, oropharynx benign, dry mucous membranes CV: tachycardic, irregularPulm: Bilateral ronchi/rales (R worse than L). Diffusely decreased air entry. Increased WOB on HFNC. GI: Soft, NT, ND, +BS. No guarding or rebound tenderness. Ext: No peripheral edema, WWPNeuro: Awake (but eyes shut), lethargic. arouses to voice. Able to respond to questions appropriately intermittently.Skin: No rashesLabs: Labs:Recent Labs   03/05/230043 03/05/230545  03/06/230519 03/07/230649 NA 146* 147* 145 141 K 4.6 4.3 4.4 4.2 CL 115 116* 113 110 CO2 25 26 26 25  BUN 26* 24 23 23  CREATININE 1.04 0.88 0.85 0.96 Recent Labs   03/04/231937 03/05/230043 03/05/230545 03/05/232118 03/06/230519 03/07/230649 CALCIUM 8.4 7.8* 7.9*  --  8.2* 8.3* MG 1.9  --   --  1.8 2.0  --  PHOS 3.2  --   --  3.0 3.2  --  Recent Labs   03/06/232315 03/07/230610 03/07/230649 03/07/230748 GLU 117* 128* 137* 148* Recent Labs   03/04/231936 03/05/230545 03/06/230519 03/07/230649 WBC 10.7 10.2 9.1 8.8 HGB 12.2* 10.7* 11.2* 11.5* HCT 38.90 34.20* 36.70* 36.70* PLT 355 277 298 328 MCV 94.9 95.8 96.1 93.6 RDWCV 13.4 13.3 13.2 13.0 Recent Labs   03/04/231937 ALT 18 AST 37 ALKPHOS 62 BILITOT 0.4 ALBUMIN 2.2* Recent Labs   03/04/231948 LABPROT 10.9 INR 0.99 Recent Labs   03/04/231936 LACTATE 1.7 Recent Labs   03/04/231947 03/06/230519 PROCALCITON 0.13 0.06 No results for input(s): TROPONINI, CKTOTAL in the last 72 hours.Recent Labs   03/05/230043 BNPPRO 9,175.0* No results for input(s): HGBA1C in the last 72 hours.No results for input(s): LIPASE in the last 72 hours.No results for input(s): AMMONIA in the last 72 hours.Urine Studies:No results for input(s): OSMOLALITYUR, SODIUMURR, ALBURR, PRCRRAU, PROTUR, CREATININEUR, UREAUR in the last 72 hours.Recent Labs   03/04/232009 CLARITYU Clear COLORU Yellow SPECGRAV 1.018 PHUR 6.0 PROTEINUA 1+* GLUCOSEU Negative KETONESU Negative BLOODU Negative BILIRUBINUR Negative LEUKOCYTESUR 3+* NITRITE Negative UROBILINOGEN <2.0 Imaging 24H:No results found.Echo:Results for orders placed or performed during the hospital encounter of 12/17/21 Echo 2D Complete w Doppler and CFI if Ind Image Enhancement 3D and or bubbles Result Value Ref Range  Reported Visual Range EF% 55-60 %  Narrative  ~ * Normal left ventricular size and systolic function. Mild concentric left ventricular hypertrophy.  LVEF estimated by visual assessment was between 55-60%.* Normal right ventricular cavity size, systolic function and wall motion.  Estimated right ventricular systolic pressure is 61 mmHg.  RV systolic pressure is severely elevated.* Atria are normal in size.* Mild aortic regurgitation.* Mild-moderate mitral regurgitation* Mild - moderate tricuspid regurgitation Cultures:Recent Labs Lab 03/04/231935 03/04/231946 LABBLOO No Growth to Date No Growth to Date Recent Labs Lab 03/04/232009 LABURIN No Growth No results for input(s): THROATCX in the last 168 hours.No results for input(s): SPNEUMONIAEU in the last 168 hours.No results for input(s): LEGIONELLAAG in the last 168 hours.No results for input(s): DEEPWOUNDCX in the last 168 hours.No results for input(s): AFBCULTURE in the last 168 hours.No results for input(s): AFBSTAIN in the last 168 hours.No results for input(s): AFBST in the last 168 hours.Recent Labs Lab 03/04/231947 INFLUENZAART Negative Recent Labs Lab 03/04/231947 INFLUENZBRT Negative  EKG: Results for orders placed or performed during the hospital encounter of 01/03/22 EKG Result Value Ref Range  Heart Rate 102 bpm  QRS Interval 118 ms  QT Interval 410 ms  QTC Interval 534 ms  P Axis    QRS Axis -57 deg  T Wave Axis 0 deg  P-R Interval    SEVERITY Abnormal ECG severity  Assessment & Plan: Attending Provider: Dr. Ronita Hipps?Consults: ID Dr. Eduardo Osier ?88 y.o. male with PMHx of hypertension, atrial fibrillation (not on a/c), type 2 diabetes (diet controlled), dementia, glaucoma, macular degeneration, and BPH presents with hypoxia and dyspnea found to have worsening bilateral pneumonia on imaging. On IV vanc/meropenem. Requiring HFNC. Prognosis guarded. Ongoing GOC conversation.?#Acute hypoxemic respiratory failure 2/2 bilateral, multifocal pneumonia#Recent hospitalization#Sepsis 2/2 pulmonary sourceABG: 7.42/38/121/98 Influenza and COVID negativeViral  panel negativeMRSA 3/4: Negative- s/p vancomycin (3/4-3/7), MRSA negative- c/w Meropenem 1g q12h (3/4 - ) given PCN allergy, discussed with Dr. Eduardo Osier- s/p 2L NS bolus (1 by EMS, 1L in ED)- f/u Lower respiratory culture 3/4: 2+ yeast (preliminary)- f/u BCx 3/4: NGTD - urine cultures : No growth- f/u urine legionella/strep- currently requiring HFNC 40%/40LPM, wean off as tolerated- Tylenol for fevers, trend inflammatory markers (wbc, procal, crp)	- crp: 12.5 -> 9.9 -> 8.9	- procal: 0.13 -> 0.06	- wbc: 10.2 -> 9.1 -> 8.8- Narrow abx pending respiratory cultures- Acapella and incentive spirometer as able- Suctioning for worsening secretions- SLP eval 3/6: NPO w/ GOC discussions for feeding- SLP eval 3/7 (per family request to help guide them in making GOC decisions), appreciate recs:	-  Continue NPO unless per GOC family wliling to accept imminent risk of aspiratoin	- routine and rigorous oral care 3-4x/day with suction set up	- Unless drastic improvement in alertness and respiratory condition occur, no further SLP re-eval warranted at this time - will defer further SLP eval until clearer GOC and improvements in AMS and respiratory status	- continue with IVF meanwhile: D5 1/2NS @ 75cc/hr?#Elevated troponinLikely demand ischemia iso acute illness/infection- hs trop 75 - 70 - 70- monitor for now#hypernatremia -> resolved with d5wNa 147 -> 145 -> 141- change fluids to D5 1/2NS at 75cc/hr now that hypernatremia resolved?# HTN# HLD# hx afib. Recurrent rvr (3/6) likely iso of acute infection- Hold PTA Toprol and statin 2/2 NPO- ECHO (2/17) - EF 55-60%, normal RV size and function, RVSP severely elevated, mild-mod MR- c/w lopressor IV 1.25mg  q6hr- still tachy from 90-120s- lopressor IV 1.25mg  prn ordered for HR >140?#DM type 2 - A1c 5.9; diet controlled- POCT glucose checks and SSI -> discontinued 3/7 per family request (would like to focus on comfort)?#BPH - Holding PTA Proscar 5mg  qd and Flomax 0.4 mg bid as NPO- Bladder scans and straight cath if retaining?#Hypothyroidism- hold Levothyroxine 150 mcg qd po iso NPO- c/w IV synthroid qd iso NPO# GOC- prognosis guarded- without prompting, Daughter ([name]) expressed wishes to continue to discuss GOC going forward (3/5)- Per [name], family understands the poor prognosis and would like to discuss palliative consult/CMO approach if no improvements or decline in the next few days. - 3/6: discussed with daughter Reece Levy) at bedside. The 3 sisters would like to discuss comfort focused approach if patient fails SLP again. [name] understands that patient's prognosis is guarded, and even if he fights off the infection and passes swallow eval, risks of recurrent aspirations are very high. [name] endorsing comfort is the most important at this time- but would like to continue with IV abx and further SLP evals as patient appears comfortable to her at this time. - Palliative care consulted 3/6 for GOC clarification.- 3/7: Repeat SLP failed - done as requested by family, despite family understanding that patient will likely fail, in order to help guide family in transitioning him to palliative care/hospice. Family refusing finger sticks throughout the day to provide comfort - POCT order discontinued. Discussed with daugher (shelly) at bedside. [name] endorsing that family would never want to pursue feeding tube including temporary if patient unable to swallow safely (consistent with what other sisters have endorsed). She would like to focus on his comfort. Yet, [name] and sisters would like the patient to stay on HFNC and IV abx at the time as he appears comfortable to them. On going GOC conversation. Family meeting tomorrow at 11am with palliative team per Dr. Payton Emerald to further discuss GOC. ?DIET: NPOFLUIDS: D5 1/2NS 75cc/hrELECTROLYTES:  Replete PRN PPX: Lovenox, SCDsACCESS: PIVDISPO: TelemetryCODE STATUS: No ACLS/BLS?Primary Emergency Contact: Sommer,[name], Home Phone: [phone] Signed:Delano Scardino, MD, PGY-13/05/2022 Mobile [employer] : [phone]

## 2022-01-07 MED ORDER — ACETAMINOPHEN 325 MG TABLET
325 mg | Freq: Four times a day (QID) | ORAL | Status: DC | PRN
Start: 2022-01-07 — End: 2022-01-08

## 2022-01-07 MED ORDER — GLYCOPYRROLATE 0.2 MG/ML INJECTION SOLUTION
0.2 mg/mL | Freq: Four times a day (QID) | INTRAVENOUS | Status: DC
Start: 2022-01-07 — End: 2022-01-08
  Administered 2022-01-07 – 2022-01-08 (×5): 0.2 mL via INTRAVENOUS

## 2022-01-07 MED ORDER — ACETAMINOPHEN 325 MG RECTAL SUPPOSITORY
325 mg | Freq: Four times a day (QID) | RECTAL | Status: DC | PRN
Start: 2022-01-07 — End: 2022-01-08

## 2022-01-07 MED ORDER — ACETAMINOPHEN 650 MG/20.3 ML ORAL SOLUTION
650 mg/20.3 mL | Freq: Four times a day (QID) | ORAL | Status: DC | PRN
Start: 2022-01-07 — End: 2022-01-08

## 2022-01-07 MED ORDER — GLYCOPYRROLATE 1 MG/5 ML (0.2 MG/ML) ORAL SOLUTION
1 mg/5 mL (0.2 mg/mL) | Freq: Four times a day (QID) | ORAL | Status: DC
Start: 2022-01-07 — End: 2022-01-08

## 2022-01-07 MED ORDER — METOCLOPRAMIDE 5 MG/ML INJECTION SOLUTION
5 mg/mL | Freq: Four times a day (QID) | INTRAVENOUS | Status: DC | PRN
Start: 2022-01-07 — End: 2022-01-08

## 2022-01-07 MED ORDER — MORPHINE 2 MG/ML INJECTION SYRINGE
2 mg/mL | INTRAVENOUS | Status: DC | PRN
Start: 2022-01-07 — End: 2022-01-08
  Administered 2022-01-07 – 2022-01-08 (×5): 2 mL via INTRAVENOUS

## 2022-01-07 MED ORDER — WHITE PETROLATUM TOPICAL OINTMENT IN PACKET
Status: DC
Start: 2022-01-07 — End: 2022-01-08

## 2022-01-07 MED ORDER — CARBOXYMETHYLCELLULOSE SODIUM 0.5 % EYE DROPS IN A DROPPERETTE
0.5 % | Freq: Four times a day (QID) | OPHTHALMIC | Status: DC | PRN
Start: 2022-01-07 — End: 2022-01-08

## 2022-01-07 NOTE — Progress Notes
[facility] Progress NoteAttending Provider: Jene Every, MD Subjective                                                                              Subjective: Interim History: -made CMO last night-on NC-family at bedsideReview of Allergies/Meds/Hx: Review of Allergies/Meds/Hx:I have reviewed the patient's: allergies and current scheduled medications Objective Objective: Vitals:Last 24 hours: Temp:  [98.8 ?F (37.1 ?C)] 98.8 ?F (37.1 ?C)Pulse:  [60-138] 60Resp:  [18-44] 20BP: (139-166)/(71-100) 154/80SpO2:  [94 %-97 %] 95 %I/O's:No intake or output data in the 24 hours ending 01/07/22 1133Procedures:Physical Exam Constitutional:     Appearance: elderly male; ill looking; on NC oxygen, mild tachypnea, somnolent, eyes closedHENT:    Head: Normocephalic and atraumatic. .Cardiovascular:    Rate and Rhythm: tachy Pulmonary:    Comments: tachypenic, wet upper airway soundsAbdominal:    General: here is no distension.    Palpations: Abdomen is soft.    Tenderness: There is no abdominal tenderness. Musculoskeletal:     Right lower leg: No edema.    Left lower leg: No edema. Neurological:    Comments:  somnolentLabs:Last 24 hours: No results found for this or any previous visit (from the past 24 hour(s)).Diagnostics:CXR: New/increased multifocal pneumonia most prominently at the right upper lobe.  Assessment Assessment: Assessment:88 yo male, resident at [facility], with PMH of AFib (not on A/C), DM (not on meds), HTN, BPH, anxiety, hypothyroidism, cavernous malformation, small traumatic intraparenchymal hemorrhage in 2019, memory loss, gait abnormality.Was in [facility] 2/15-2/21 from  home with lethargy, tachypnea, hypoxemia. Found to have bilateral pneumonia. Question sepsis given lethargy (encephalopathy), AKI (cr 1.5 from normal baseline), tachypnea and tachycardia. Improved with IVF, course of azithro/rocephin. Diet resumed and was on RA prior to transfer to [facility] for STR. On 3/4 pt got lethargic with tachypnea and was desaturating in low 80's so sent back to ED. In ED pt altered with hypoxia, soft BP, worsening infiltrate on CXR. Placed on HF oxygen, bolused with IVF, started on IV vanco and merem, made NPO and admitted to teleSuspect recurrent aspiration pneumoniaWith continued respiratory decline, patient appropriately now CMO Plan Plan: -CMO care-high-flow discontinued.  Now on nasal cannula for comfort.-antibiotics, all other therapeutic medications discontinued-continue Robinul for secretions-Dilaudid, Ativan p.r.n.-pleasure feeding but he seems too somnolent to tolerate any  adequate p.o..  Also very high aspiration risk.  Family aware that nutrition no longer considered part of CMO care-would transfer off telemetry-evaluation for inpatient hospice-discussed with daughters at bedside Electronically Signed:[id number] Noriel Guthrie [place] 14233/06/2022,

## 2022-01-07 NOTE — Plan of Care
Plan of Care Overview/ Patient Status    Assumed care of patient at 1900. A+o x1, only oriented to self. Pt is confused and lethargic. Pt arouses to voice and is able to follow simple commands. Speaks short words only. No c/o pain.On HFNC 40%/40L sating  >92%. Pt c/o shortness of breath, Resident Moumin notified, no new orders. Orally suctioned PRN with tan thick secretions. Tachypneic RR 40-44, Resident Moumin and Resident Qu notified, patients daughters contacted regarding goals of care this shift.Afib, hr 80-100s, sbp 140-160s, given metop per MAR. Pt had 7 beats of Vtach this morning, Resident Qu notified, no new orders.Incontinent of bowel and bladder. No bm this shift. NPO. Fingerstick q6h refused per family. Incontinence care rendered PRN.Skin intact. Redness to bottom, barrier cream applied. Multiple mepilexs on spine, dressings CDI.  Turned and repositioned. Bed in lowest position, call bell within reach, safety maintained. 2300: Resident Ladell Heads notified RN of comfort measures only (CMO) order per patients daughters. Daughters at bedside. Patient remains on 6L humidified nasal cannula for comfort.

## 2022-01-07 NOTE — ACP (Advance Care Planning)
Called daughter [name] about patient's tachypnea with increased WOB. [name] prefers to initiate comfort care only.

## 2022-01-07 NOTE — Plan of Care
Plan of Care Overview/ Patient Status    Patient had an increased WOB with tachypnea (resp rate 40-45) last night. Patient expressed  help me. I am not feeling well. I called patient's daughter [name] last night, updated to family. [name] and her sister are in agreement with comfort care only. Initiated CMO last night. 2 daughters came and visited patient at bedside last night. Patient's resp condition is improving, resp rate  around 20's. No acute distress noted. Family appreciate care, and would like to cancel GOC meeting this morning (scheduled at 11am). Family would like to be updated if anything happens.

## 2022-01-07 NOTE — Plan of Care
Plan of Care Overview/ Patient Status    Notified by Dr. Payton Emerald.Patient made CMO and referral sent to [facility] and [facility] for review.CM waiting response.

## 2022-01-07 NOTE — Plan of Care
Plan of Care Overview/ Patient Status    Assumed care of patient at 0700. Patient found comfortable in bed. Family at bedside. No evidence of distress. PRN medications per eMAR. WCTM

## 2022-01-07 NOTE — Progress Notes
[facility]	Progress Note  4Attending Provider: Jene Every, MDSubjective: 24 hour events:Patient transitioned to CMO last night - placed on NC. CMO orders reviewed; orders adjusted as in a/p below. Patient seen and assessed at bedside this morning. Patient is ill-appearing, lethargic, minimally-responsive. Mouth breathing on NC. Minimal accessory muscle use. Overall appearing comfortable. Family at bedside - endorsing that they had a closure and decided to fully focus on his comfort at this time last night. They would like to discuss hospice options. After discussion with Dr. Payton Emerald, family would like to pursue GIP hospice referral at this time. Referral placed per CM.  Meds: Scheduled Meds:Current Facility-Administered Medications Medication Dose Route Frequency Provider Last Rate Last Admin ? glycopyrrolate (ROBINUL) injection 0.2 mg  0.2 mg IV Push Q6H Chevi Lim, Carlean Jews, MD   0.2 mg at 01/07/22 1401  Or ? glycopyrrolate (CUVPOSA) 1 mg/5 mL (0.2 mg/mL) oral solution 2 mg  2 mg Oral Q6H Ridley Dileo, Carlean Jews, MD     ? latanoprost (XALATAN) 0.005 % ophthalmic solution 1 drop  1 drop Both Eyes Nightly Retalis, Cleora Fleet, DO   1 drop at 01/06/22 2204 ? sodium chloride 0.9 % flush 3 mL  3 mL IV Push Q8H Retalis, Cleora Fleet, DO   3 mL at 01/06/22 2203 ? zinc oxide 40 % ointment 1 Application  1 Application Topical (Top) 2 times daily Jene Every, MD   1 Application at 01/06/22 2001 Continuous Infusions:PRN Meds:acetaminophen **OR** acetaminophen **OR** acetaminophen, bisacodyL, artificial tears, HYDROmorphone, LORazepam, metoclopramide (REGLAN) IV Push OR IVPB, sodium chlorideObjective: Vitals:Temp:  [97.6 ?F (36.4 ?C)-98.8 ?F (37.1 ?C)] 97.6 ?F (36.4 ?C)Pulse:  [60-138] 85Resp:  [20-44] 30BP: (139-158)/(60-100) 144/60SpO2:  [94 %-98 %] 98 %Device (Oxygen Therapy): humidified;nasal cannulaO2 Flow (L/min):  [3-40] 3 I/O's:No intake or output data in the 24 hours ending 01/07/22 1409 Physical Exam:Gen: Ill appearing, lying in bed, NC in place, eyes shut, minimally responsive to voice HEENT: PERRL, anicteric, oropharynx benign, dry mucous membranes CV: tachycardic, irregularPulm: Bilateral ronchi/rales (R worse than L). Diffusely decreased air entry.  Mildly increased WOB. GI: Soft, NT, ND, +BS. No guarding or rebound tenderness. Ext: No peripheral edema, WWPNeuro: eyes shut, lethargic. minimally responds to voiceSkin: No rashesLabs: Labs:Recent Labs   03/06/230519 03/07/230649 NA 145 141 K 4.4 4.2 CL 113 110 CO2 26 25 BUN 23 23 CREATININE 0.85 0.96 Recent Labs   03/05/232118 03/06/230519 03/07/230649 CALCIUM  --  8.2* 8.3* MG 1.8 2.0  --  PHOS 3.0 3.2  --  Recent Labs   03/06/232315 03/07/230610 03/07/230649 03/07/230748 GLU 117* 128* 137* 148* Recent Labs   03/06/230519 03/07/230649 WBC 9.1 8.8 HGB 11.2* 11.5* HCT 36.70* 36.70* PLT 298 328 MCV 96.1 93.6 RDWCV 13.2 13.0 No results for input(s): ALT, AST, ALKPHOS, BILITOT, BILIDIR, ALBUMIN in the last 72 hours.No results for input(s): PTT, LABPROT, INR, FIBRINOGEN in the last 72 hours.No results for input(s): LACTATE in the last 72 hours.Recent Labs   03/06/230519 PROCALCITON 0.06 No results for input(s): TROPONINI, CKTOTAL in the last 72 hours.No results for input(s): BNPPRO in the last 72 hours.No results for input(s): HGBA1C in the last 72 hours.No results for input(s): LIPASE in the last 72 hours.No results for input(s): AMMONIA in the last 72 hours.Urine Studies:No results for input(s): OSMOLALITYUR, SODIUMURR, ALBURR, PRCRRAU, PROTUR, CREATININEUR, UREAUR in the last 72 hours.No results for input(s): CLARITYU, COLORU, SPECGRAV, PHUR, PROTEINUA, GLUCOSEU, KETONESU, BLOODU, BILIRUBINUR, LEUKOCYTESUR, NITRITE, UROBILINOGEN in the last 72 hours.Imaging 24H:No results found.Echo:Results for orders placed or performed during the  hospital encounter of 12/17/21 Echo 2D Complete w Doppler and CFI if Ind Image Enhancement 3D and or bubbles Result Value Ref Range  Reported Visual Range EF% 55-60 %  Narrative  ~ * Normal left ventricular size and systolic function. Mild concentric left ventricular hypertrophy.  LVEF estimated by visual assessment was between 55-60%.* Normal right ventricular cavity size, systolic function and wall motion.  Estimated right ventricular systolic pressure is 61 mmHg.  RV systolic pressure is severely elevated.* Atria are normal in size.* Mild aortic regurgitation.* Mild-moderate mitral regurgitation* Mild - moderate tricuspid regurgitation Cultures:Recent Labs Lab 03/04/231935 03/04/231946 LABBLOO No Growth to Date No Growth to Date Recent Labs Lab 03/04/232009 LABURIN No Growth No results for input(s): THROATCX in the last 168 hours.No results for input(s): SPNEUMONIAEU in the last 168 hours.No results for input(s): LEGIONELLAAG in the last 168 hours.No results for input(s): DEEPWOUNDCX in the last 168 hours.No results for input(s): AFBCULTURE in the last 168 hours.No results for input(s): AFBSTAIN in the last 168 hours.No results for input(s): AFBST in the last 168 hours.Recent Labs Lab 03/04/231947 INFLUENZAART Negative Recent Labs Lab 03/04/231947 INFLUENZBRT Negative  EKG: Results for orders placed or performed during the hospital encounter of 01/03/22 EKG Result Value Ref Range  Heart Rate 102 bpm  QRS Interval 118 ms  QT Interval 410 ms  QTC Interval 534 ms  P Axis    QRS Axis -57 deg  T Wave Axis 0 deg  P-R Interval    SEVERITY Abnormal ECG severity  Assessment & Plan: Attending Provider: Dr. Ronita Hipps?Consults: ID Dr. Eduardo Osier, Palliative care?88 y.o. male with PMHx of hypertension, atrial fibrillation (not on a/c), type 2 diabetes (diet controlled), dementia, glaucoma, macular degeneration, and BPH presents with hypoxia and dyspnea found to have worsening bilateral pneumonia on imaging. s/p IV vanc/meropenem. s/p HFNC. Given guarded prognosis - family elected to transition to CMO on 3/7 evening.# CMONo further Lab Draws/FingersticksNo further Diagnostic ImagingDiscontinued IV FluidsDiscontinued IV AntibioticsDiscontinued HFNCOxygen for comfort via Nasal Canula up to 3LCheck Vitals QD24 Hour VisitationLorazepam 2mg  IV q2hr PRN for agitation/anxietyRobinul 0.2mg  IV q6hr for secretionsLantoprost eye drops nightlyTylenol PRN for feversAnti-emetics PRNBowel Regimen OrderedComfort FeedsDilaudid 2mg  IV q2hr prn for non-verbal signs of discomfort/painMorphine 2mg  IV q2hr prn for air hunger, increased WOB# GOC- prognosis guarded- without prompting, Daughter ([name]) expressed wishes to continue to discuss GOC going forward (3/5)- Per [name], family understands the poor prognosis and would like to discuss palliative consult/CMO approach if no improvements or decline in the next few days. - 3/6: discussed with daughter Reece Levy) at bedside. The 3 sisters would like to discuss comfort focused approach if patient fails SLP again. [name] understands that patient's prognosis is guarded, and even if he fights off the infection and passes swallow eval, risks of recurrent aspirations are very high. [name] endorsing comfort is the most important at this time- but would like to continue with IV abx and further SLP evals as patient appears comfortable to her at this time. - Palliative care consulted 3/6 for GOC clarification.- 3/7: Repeat SLP failed - done as requested by family, despite family understanding that patient will likely fail, in order to help guide family in transitioning him to palliative care/hospice. Family refusing finger sticks throughout the day to provide comfort - POCT order discontinued. Discussed with daugher (shelly) at bedside. [name] endorsing that family would never want to pursue feeding tube including temporary if patient unable to swallow safely (consistent with what other sisters have endorsed). She would like to focus on  his comfort. Yet, [name] and sisters would like the patient to stay on HFNC and IV abx at the time as he appears comfortable to them. On going GOC conversation. Family meeting tomorrow at 11am with palliative team per Dr. Payton Emerald to further discuss GOC. - 3/8: Patient transitioned to CMO overnight iso worsening respiratory status. Family wishing to pursue GIP hospice eval at this time. Palliative care team following, appreciate recs. ?DIET:  Comfort feeds/CMOFLUIDS: No fluids, CMOACCESS: PIV for CMO medsCODE STATUS: No ACLS/BLS/CMO?Primary Emergency Contact: Sommer,[name], Home Phone: [phone] Signed:Woody Kronberg, MD, PGY-13/06/2022 Mobile Heartbeat : [phone]

## 2022-01-07 NOTE — Other
[facility] HealthPall[facility] Consult Note Re: Keith Strickland (26-Jul-1919)MRN: [id number] Date of service: 3/8/2023Date of admission: 3/4/2023Consultation requested by: Attending Provider: Jene Every, MD [phone] Reason for consultation: assistance with clarification of goals of careHistory obtained from patient, review of EMR, discussion with primary team, primary RN and patient's daughter, Burnett Harry. Records reviewed and summarized below.CC: dysphagiaHistory of Present Illness:Keith Strickland is a 88 y.o. male with PMH of hypertension, atrial fibrillation, type 2 diabetes (diet controlled), dementia, glaucoma, macular degeneration, and BPH recently who was admitted with Respiratory failure, unspecified chronicity, unspecified whether with hypoxia or hypercapnia (HC Code) (HC CODE) (HC Code). Palliative Care is being consulted to assist with goals of care discussions.Patient had clinical decline overnight and patient was made CMO after discussions with housestaff. Today, daughter, Burnett Harry, desires a family meeting to discuss his situation and options going forward. Discussed patient's hospital course, current condition, psychosocial history, ACP, hospice care, goals of care and disposition. Patient currently lives at [facility] with 24/7 aide support. In discussions with staff at [facility], the patient's family felt like his needs could not be fully met if he returned to his home. They desire a referral for GIP hospice at [facility]. Questions answered and counseling provided.Symptom Assessment: Patient in unable to provide history due to cognitive deficits and clinical declinePast Medical History: Diagnosis Date ? Anxiety  ? Closed displaced fracture of left femoral neck (HC Code)  ? GERD (gastroesophageal reflux disease)  ? Glaucoma  ? Hyperlipidemia  ? Hypertension  ? Hypothyroidism  ? IBS (irritable bowel syndrome) ? Kyphosis  ? Lumbosacral radiculopathy  ? Major depression  ? Osteoarthritis  ? Paroxysmal atrial fibrillation (HC Code) (HC CODE) (HC Code)  ? Pneumonia  ? Vitamin deficiency  Past Surgical History: Procedure Laterality Date ? FRACTURE SURGERY   Scheduled Meds:Current Facility-Administered Medications Medication Dose Route Frequency Provider Last Rate Last Admin ? glycopyrrolate (ROBINUL) injection 0.2 mg  0.2 mg IV Push Q6H Park, Carlean Jews, MD   0.2 mg at 01/07/22 1401  Or ? glycopyrrolate (CUVPOSA) 1 mg/5 mL (0.2 mg/mL) oral solution 2 mg  2 mg Oral Q6H Park, Carlean Jews, MD     ? latanoprost (XALATAN) 0.005 % ophthalmic solution 1 drop  1 drop Both Eyes Nightly Retalis, Cleora Fleet, DO   1 drop at 01/06/22 2204 ? sodium chloride 0.9 % flush 3 mL  3 mL IV Push Q8H Retalis, Cleora Fleet, DO   3 mL at 01/06/22 2203 Continuous Infusions:PRN Meds:.acetaminophen **OR** acetaminophen **OR** acetaminophen, bisacodyL, artificial tears, HYDROmorphone, LORazepam, metoclopramide (REGLAN) IV Push OR IVPB, morphine (ADULT), sodium chlorideNo current facility-administered medications on file prior to encounter. Current Outpatient Medications on File Prior to Encounter Medication Sig Dispense Refill ? acetaminophen (TYLENOL) 325 mg tablet Take 2 tablets (650 mg total) by mouth every 6 (six) hours as needed (Pain/Fever). 100 tablet 5 ? azelastine (OPTIVAR) 0.05 % ophthalmic solution Place 1 drop into both eyes daily. 6 mL 5 ? cholecalciferol, vitamin D3, 125 mcg (5,000 unit) capsule Take 1 capsule (5,000 Units total) by mouth. In the morning every Tuesday and Friday.   ? enoxaparin (LOVENOX) 40 mg/0.4 mL syringe Inject 40 mg under the skin daily. For 2 weeks   ? escitalopram oxalate (LEXAPRO) 10 mg tablet Take 1 tablet (10 mg total) by mouth at bedtime.   ? finasteride (PROSCAR) 5 mg tablet Take 1 tablet (5 mg total) by mouth daily. 30 tablet 5 ? latanoprost (XALATAN) 0.005 % ophthalmic solution Place 1 drop into  both eyes nightly.   ? levothyroxine (SYNTHROID, LEVOTHROID) 150 mcg tablet Take 1 tablet (150 mcg total) by mouth every morning. On an empty stomach, at least 30 minutes before breakfast.   ? magnesium hydroxide (MILK OF MAGNESIA) 400 mg/5 mL suspension Take 30 mLs by mouth daily as needed for constipation.   ? melatonin 1 mg tablet Take 1 tablet (1 mg total) by mouth nightly.   ? metoprolol succinate XL (TOPROL-XL) 25 mg 24 hr tablet Take 0.5 tablets (12.5 mg total) by mouth daily. Take with or immediately following a meal. 15 tablet 5 ? nut.tx,spec.frm,l-fr,iron-fos (TWOCAL HN) 0.08-2 gram-kcal/mL Liqd Take 237 mLs by mouth daily.   ? OLANZapine (ZYPREXA) 5 mg tablet Take 2.5 mg by mouth nightly.   ? omeprazole (PRILOSEC) 20 mg capsule Take 1 tablet (20 mg total) by mouth daily.   ? polyethylene glycol (MIRALAX) 17 gram packet Take 1 packet (17 g total) by mouth daily as needed for constipation. Mix in 8 ounces of water, juice, soda, coffee or tea prior to taking.   ? pravastatin (PRAVACHOL) 20 mg tablet Take 1 tablet (20 mg total) by mouth daily with dinner. 30 tablet 5 ? propylene glycoL (SYSTANE BALANCE) 0.6 % ophthalmic solution Place 1 drop into both eyes 3 (three) times daily.   ? tamsulosin (FLOMAX) 0.4 mg 24 hr capsule Take 1 capsule (0.4 mg total) by mouth 2 (two) times daily.   ? timolol (TIMOPTIC) 0.5 % ophthalmic solution Place 1 drop into both eyes daily. 10 mL 5 Social History Socioeconomic History ? Marital status: Widowed   Spouse name: Not on file ? Number of children: Not on file ? Years of education: Not on file ? Highest education level: Not on file Occupational History ? Not on file Tobacco Use ? Smoking status: Never ? Smokeless tobacco: Never Substance and Sexual Activity ? Alcohol use: No ? Drug use: No ? Sexual activity: Not Currently Other Topics Concern ? Not on file Social History Narrative ? Not on file Social Determinants of Health Financial Resource Strain: Low Risk  ? Difficulty of Paying Living Expenses: Not hard at all Food Insecurity: No Food Insecurity ? Worried About Programme researcher, broadcasting/film/video in the Last Year: Never true ? Ran Out of Food in the Last Year: Never true Transportation Needs: No Transportation Needs ? Lack of Transportation (Medical): No ? Lack of Transportation (Non-Medical): No Physical Activity: Not on file Stress: Not on file Social Connections: Not on file Intimate Partner Violence: Not on file Housing Stability: Low Risk  ? Housing Stability: I have a steady place to live Family History Problem Relation Age of Onset ? No Known Problems Mother  ? No Known Problems Father  Review of Systems Unable to perform ROS: Dementia Physical ExamConstitutional:     General: He is sleeping. He is not in acute distress.   Appearance: He is underweight. He is ill-appearing. He is not toxic-appearing. HENT:    Head: Normocephalic and atraumatic. Cardiovascular:    Rate and Rhythm: Normal rate. Pulmonary:    Effort: Pulmonary effort is normal. No respiratory distress. Psychiatric:       Cognition and Memory: Cognition is impaired. Vital Signs:Vitals:  01/07/22 1347 BP: (!) 144/60 Pulse: 85 Resp: (!) 30 Temp: 97.6 ?F (36.4 ?C) Recent Labs Lab 03/05/230545 03/06/230519 03/07/230649 WBC 10.2 9.1 8.8 HGB 10.7* 11.2* 11.5* HCT 34.20* 36.70* 36.70* PLT 277 298 328 Recent Labs Lab 03/04/231937 03/05/230022 03/05/230545 03/05/230721 03/06/230519 03/06/230621 03/07/230610 03/07/230649 03/07/230748 NA 141   < >  147*  --  145  --   --  141  --  K 5.8*   < > 4.3  --  4.4  --   --  4.2  --  CL 112   < > 116*  --  113  --   --  110  --  CO2 27   < > 26  --  26  --   --  25  --  BUN 27*   < > 24  --  23  --   --  23 --  CREATININE 1.17   < > 0.88  --  0.85  --   --  0.96  --  CALCIUM 8.4   < > 7.9*  --  8.2*  --   --  8.3*  --  PROT 6.6  --   --   --   --   --   --   --   --  BILITOT 0.4  --   --   --   --   --   --   --   --  ALKPHOS 62  --   --   --   --   --   --   --   --  ALT 18  --   --   --   --   --   --   --   --  AST 37  --   --   --   --   --   --   --   --  GLU 158*   < > 110*   < > 123*   < > 128* 137* 148*  < > = values in this interval not displayed. XR Chest PA or APResult Date: 3/4/2023AP CHEST: 01/03/2022. CLINICAL HISTORY: Malaise, Sepsis. COMPARISON: Chest radiograph 12/17/2021 FINDINGS: Lines/Tubes/Devices: None. Lungs: New dense consolidative opacity within the right upper lobe and increased more patchy opacities throughout the right middle and lower lung zones and left midlung zone. These are all increased from 12/17/2021. Pleura: No pleural effusion. No pneumothorax. Cardiomediastinal silhouette: Unchanged. Bones/soft tissues: Unchanged. Upper Abdomen: Unremarkable. Limited AP view- New/increased multifocal pneumonia most prominently at the right upper lobe. [facility] Radiology Notify System Classification: Orange - Urgent without Follow-up Reported and Signed by:  Lonia Chimera, MD Palliative Care Scales:PPSV2:    20%Decision Making Ability: no decision making capacitySurrogate Decision Maker: adult childCode Status: No ACLSOverall Goals: CMO, GIP hospiceAssessment:Dominik Gienger is a 88 y.o. male with PMH of hypertension, atrial fibrillation, type 2 diabetes (diet controlled), dementia, glaucoma, macular degeneration, and BPH recently who was admitted with Respiratory failure, unspecified chronicity, unspecified whether with hypoxia or hypercapnia (HC Code) (HC CODE) (HC Code). Palliative Care is being consulted to assist with goals of care discussions.#Respiratory failure#Pneumonia#Dysphagia#Dementia#Physical debility#AKIRecommendations:#Goals of care/complex medical decision making-CMO-hospice referral for GIP-continue current medical management-will continue to provide supportive care to patient and family during hospitalization-will continue to address goals of care and assist with family meetings as neededFamily Concerns: Patient's comfort and confidence in treating staffSpiritual Concerns: assessments per Unit or Palliative Care ChaplainTime committed: 75 minutes of provider level activities performed (chart review, communication with primary team and staff, patient examination, family discussions, documentation)Support offered at this time. Thank you for this consultation. Palliative Care Team will continue to follow patient. Fransico Setters, MD, FAAHPMMedical Director- [facility] for Palliative [phone]/8/20233:26 PM

## 2022-01-07 NOTE — Plan of Care
[facility]	Spiritual Care NotePurpose: Spiritual supportObservation: People present/  pt, daughter Burnett Harry and private caregiver AnnaQuality of Relational Support: Familiy In-Town and Involved Types of Relational Support: Caregiver, Daughter, Family   Subjective: Visit at bedside. Pt did not appear to be responsive. Daughter Burnett Harry and private caregiver Tobi Bastos present. As per daughter's request, offered Jewish prayer for the ill (Mish[place]yrach) in both Hebrew and in [place], the recitation of Psalms and a Programme researcher, broadcasting/film/video of the [place].Spiritual Assessment:Daughter presents as sad and tearful in alignment with anticipatory grief. She shared stories from the past about her father. Daughter reflected how Jewish prayers, music and scripture reading are of great comfort to her.  Relational SupportTypes of Relational Support: Engineer, structural, Daughter, Science writer of Relational Support: Familiy In-Town and Involved Religious Affiliation: Jewish  Spiritual Resources: Diplomatic Services operational officer, Spiritual Values, FamilyHelpful Religious Practices: Music Spiritual Interventions:Visit included spiritual companionship, compassionate presence, family support, facilitation of life review and Jewish prayer and music.Spiritual Intervention Index**Date of Spiritual Visit: 03/08/23Visit Type: ConsultIntervention Type: Spiritual VisitResponding Chaplain: Palliative CareConsulted With: Palliative CareReligious Needs: PrayerSpiritual/Religious Support Provided: Bible/Religious Reading, Family Support, Music, Companionship, Spiritual Support, Prayer, Life ReviewOutcome: OUTCOMES: Expressed Spiritual/Religious Resources or Distress: Utilized Loss adjuster, chartered, Expressed gratitude, Expressed feeling spiritually nurtured   OUTCOMES: Progressed Spiritually: Established chaplain relationship, Progressed toward trust  Plan: Psycho-spiritual support available for pt and family.Signed: Gypsy Decant, PhD/ PC Chaplain3/8/20233:27 PM

## 2022-01-07 NOTE — Plan of Care
Plan of Care Overview/ Patient Status    Problem: Adult Inpatient Plan of CareGoal: Plan of Care ReviewOutcome: Interventions implemented as appropriate Assumed care of pt at 1345 as transfer from Tele. Pt is CMO, obtunded.Pt is on 2L NC for comfort, pt noted to be very tachypneic during transfer, PRN 2mg  IV Dilaudid given and PRN 2mg  Morphine given as documented in [facility].Scheduled Robinol given as ordered.2 RN skin check complete, DTI present to medial back and DTI present to coccyx, covered with protective Mepilex. Blanchable redness present to bilateral elbows. Generalized bruising present to upper and lower extremities.Caregiver and daughter at bedside and involved in care. Spiritual care provided. Call bell within reach, bed alarm on, safety maintained.Belongings with pt upon transfer include radio clock that is at bedside.

## 2022-01-08 ENCOUNTER — Inpatient Hospital Stay
Admit: 2022-01-08 | Discharge: 2022-01-08 | Payer: PRIVATE HEALTH INSURANCE | Attending: Internal Medicine | Admitting: Internal Medicine

## 2022-01-08 ENCOUNTER — Encounter
Admit: 2022-01-08 | Payer: PRIVATE HEALTH INSURANCE | Attending: Vascular and Interventional Radiology | Primary: Internal Medicine

## 2022-01-08 DIAGNOSIS — I248 Other forms of acute ischemic heart disease: Secondary | ICD-10-CM

## 2022-01-08 DIAGNOSIS — I472 Ventricular tachycardia, unspecified: Secondary | ICD-10-CM

## 2022-01-08 DIAGNOSIS — E119 Type 2 diabetes mellitus without complications: Secondary | ICD-10-CM

## 2022-01-08 DIAGNOSIS — J690 Pneumonitis due to inhalation of food and vomit: Secondary | ICD-10-CM

## 2022-01-08 DIAGNOSIS — Z20822 Contact with and (suspected) exposure to covid-19: Secondary | ICD-10-CM

## 2022-01-08 DIAGNOSIS — E785 Hyperlipidemia, unspecified: Secondary | ICD-10-CM

## 2022-01-08 DIAGNOSIS — Z66 Do not resuscitate: Secondary | ICD-10-CM

## 2022-01-08 DIAGNOSIS — F0280 Dementia in other diseases classified elsewhere without behavioral disturbance: Secondary | ICD-10-CM

## 2022-01-08 DIAGNOSIS — I10 Essential (primary) hypertension: Secondary | ICD-10-CM

## 2022-01-08 DIAGNOSIS — Z515 Encounter for palliative care: Secondary | ICD-10-CM

## 2022-01-08 DIAGNOSIS — M5417 Radiculopathy, lumbosacral region: Secondary | ICD-10-CM

## 2022-01-08 DIAGNOSIS — N179 Acute kidney failure, unspecified: Secondary | ICD-10-CM

## 2022-01-08 DIAGNOSIS — Z79899 Other long term (current) drug therapy: Secondary | ICD-10-CM

## 2022-01-08 DIAGNOSIS — A419 Sepsis, unspecified organism: Principal | ICD-10-CM

## 2022-01-08 DIAGNOSIS — M1990 Unspecified osteoarthritis, unspecified site: Secondary | ICD-10-CM

## 2022-01-08 DIAGNOSIS — Z79890 Hormone replacement therapy (postmenopausal): Secondary | ICD-10-CM

## 2022-01-08 DIAGNOSIS — F419 Anxiety disorder, unspecified: Secondary | ICD-10-CM

## 2022-01-08 DIAGNOSIS — K219 Gastro-esophageal reflux disease without esophagitis: Secondary | ICD-10-CM

## 2022-01-08 DIAGNOSIS — R1310 Dysphagia, unspecified: Secondary | ICD-10-CM

## 2022-01-08 DIAGNOSIS — I480 Paroxysmal atrial fibrillation: Secondary | ICD-10-CM

## 2022-01-08 DIAGNOSIS — E870 Hyperosmolality and hypernatremia: Secondary | ICD-10-CM

## 2022-01-08 DIAGNOSIS — I4892 Unspecified atrial flutter: Secondary | ICD-10-CM

## 2022-01-08 DIAGNOSIS — N400 Enlarged prostate without lower urinary tract symptoms: Secondary | ICD-10-CM

## 2022-01-08 DIAGNOSIS — M40209 Unspecified kyphosis, site unspecified: Secondary | ICD-10-CM

## 2022-01-08 DIAGNOSIS — G9341 Metabolic encephalopathy: Secondary | ICD-10-CM

## 2022-01-08 DIAGNOSIS — F329 Major depressive disorder, single episode, unspecified: Secondary | ICD-10-CM

## 2022-01-08 DIAGNOSIS — E039 Hypothyroidism, unspecified: Secondary | ICD-10-CM

## 2022-01-08 DIAGNOSIS — J9601 Acute respiratory failure with hypoxia: Secondary | ICD-10-CM

## 2022-01-08 MED ORDER — SODIUM CHLORIDE 0.9 % (FLUSH) INJECTION SYRINGE
0.9 % | Freq: Three times a day (TID) | INTRAVENOUS | Status: DC
Start: 2022-01-08 — End: 2022-01-09

## 2022-01-08 MED ORDER — LORAZEPAM 2 MG/ML INJECTION SOLUTION
2 mg/mL | INTRAVENOUS | Status: DC | PRN
Start: 2022-01-08 — End: 2022-01-09

## 2022-01-08 MED ORDER — METOCLOPRAMIDE 5 MG/ML INJECTION SOLUTION
5 mg/mL | Freq: Four times a day (QID) | INTRAVENOUS | Status: DC | PRN
Start: 2022-01-08 — End: 2022-01-09

## 2022-01-08 MED ORDER — HYDROMORPHONE BOLUS FROM BAG (0.2 MG/ML)
INTRAVENOUS | Status: DC | PRN
Start: 2022-01-08 — End: 2022-01-08

## 2022-01-08 MED ORDER — LATANOPROST 0.005 % EYE DROPS
0.005 % | Freq: Every evening | OPHTHALMIC | Status: DC
Start: 2022-01-08 — End: 2022-01-09

## 2022-01-08 MED ORDER — ACETAMINOPHEN 650 MG/20.3 ML ORAL SOLUTION
650 mg/20.3 mL | Freq: Four times a day (QID) | ORAL | Status: DC | PRN
Start: 2022-01-08 — End: 2022-01-09

## 2022-01-08 MED ORDER — HYDROMORPHONE BOLUS FROM BAG (0.2 MG/ML)
INTRAVENOUS | Status: DC | PRN
Start: 2022-01-08 — End: 2022-01-09

## 2022-01-08 MED ORDER — SODIUM CHLORIDE 0.9 % (FLUSH) INJECTION SYRINGE
0.9 % | INTRAVENOUS | Status: DC | PRN
Start: 2022-01-08 — End: 2022-01-09

## 2022-01-08 MED ORDER — HYDROMORPHONE BOLUS FROM BAG (0.2 MG/ML)
Freq: Once | INTRAVENOUS | Status: DC
Start: 2022-01-08 — End: 2022-01-08

## 2022-01-08 MED ORDER — CARBOXYMETHYLCELLULOSE SODIUM 0.5 % EYE DROPS IN A DROPPERETTE
0.5 % | Freq: Four times a day (QID) | OPHTHALMIC | Status: DC | PRN
Start: 2022-01-08 — End: 2022-01-09

## 2022-01-08 MED ORDER — ACETAMINOPHEN 325 MG TABLET
325 mg | Freq: Four times a day (QID) | ORAL | Status: DC | PRN
Start: 2022-01-08 — End: 2022-01-09

## 2022-01-08 MED ORDER — GLYCOPYRROLATE 0.2 MG/ML INJECTION SOLUTION
0.2 mg/mL | Freq: Four times a day (QID) | INTRAVENOUS | Status: DC
Start: 2022-01-08 — End: 2022-01-09

## 2022-01-08 MED ORDER — BISACODYL 10 MG RECTAL SUPPOSITORY
10 mg | Freq: Every day | RECTAL | Status: DC | PRN
Start: 2022-01-08 — End: 2022-01-09

## 2022-01-08 MED ORDER — HYDROMORPHONE 10 MG/50 ML NS INFUSION (GH)
INTRAVENOUS | Status: DC
Start: 2022-01-08 — End: 2022-01-09

## 2022-01-08 MED ORDER — ACETAMINOPHEN 325 MG RECTAL SUPPOSITORY
325 mg | Freq: Four times a day (QID) | RECTAL | Status: DC | PRN
Start: 2022-01-08 — End: 2022-01-09

## 2022-01-08 MED ORDER — GLYCOPYRROLATE 1 MG/5 ML (0.2 MG/ML) ORAL SOLUTION
1 mg/5 mL (0.2 mg/mL) | Freq: Four times a day (QID) | ORAL | Status: DC
Start: 2022-01-08 — End: 2022-01-08

## 2022-01-08 MED ORDER — HYDROMORPHONE 10 MG/50 ML NS INFUSION (GH)
INTRAVENOUS | Status: DC
Start: 2022-01-08 — End: 2022-01-08
  Administered 2022-01-08: 14:00:00 50.000 mL/h via INTRAVENOUS

## 2022-01-08 NOTE — Progress Notes
[facility]	Progress Note  5Attending Provider: Jene Every, MDSubjective: 24 hour events:CMO since 3/7 evening.Overnight, patient requiring IV morphine and hydromorphone about every 2hrs for non-verbal signs of discomfort/pain. Patient seen and assessed at bedside this morning. Patient is ill-appearing, obtunded, non-responsive. Patient showing increased WOB (mouth breathing, supra/infraclavicular accessory muscle use), appearing to be in mild to moderate discomfort. Daughter ([name]) and her husband at the bedside. Family in agreement to start dilaudid drip to provide further comfort given ongoing s/s of discomfort/pain. Orders placed - POC reviewed with covering RN. Meds: Scheduled Meds:Current Facility-Administered Medications Medication Dose Route Frequency Provider Last Rate Last Admin ? glycopyrrolate (ROBINUL) injection 0.2 mg  0.2 mg IV Push Q6H Jaxan Michel, Carlean Jews, MD   0.2 mg at 02-07-2022 3086  Or ? glycopyrrolate (CUVPOSA) 1 mg/5 mL (0.2 mg/mL) oral solution 2 mg  2 mg Oral Q6H Izaan Kingbird, Carlean Jews, MD     ? latanoprost (XALATAN) 0.005 % ophthalmic solution 1 drop  1 drop Both Eyes Nightly Retalis, Cleora Fleet, DO   1 drop at 01/06/22 2204 ? sodium chloride 0.9 % flush 3 mL  3 mL IV Push Q8H Retalis, Cleora Fleet, DO   3 mL at 01/06/22 2203 ? white petrolatum packet (VASELINE) ointment          Continuous Infusions:? HYDROmorphone (DILAUDID) 10 mg in sodium chloride 0.9 % 50 mL (0.2 mg/mL) infusion 1.5 mg/hr (02-07-2022 0831) PRN Meds:acetaminophen **OR** acetaminophen **OR** acetaminophen, bisacodyL, artificial tears, HYDROmorphone, HYDROmorphone, LORazepam, metoclopramide (REGLAN) IV Push OR IVPB, sodium chlorideObjective: Vitals:Temp:  [97.6 ?F (36.4 ?C)] 97.6 ?F (36.4 ?C)Pulse:  [85] 85Resp:  [30] 30BP: (144)/(60) 144/60SpO2:  [98 %] 98 %O2 Flow (L/min):  [2-3] 2 I/O's:No intake or output data in the 24 hours ending 2022-02-07 1039 Physical Exam:Gen: Ill appearing, lying in bed, NC in place, eyes shut, non-responsive HEENT: PERRL, anicteric, oropharynx benign, dry mucous membranes CV: tachycardic, irregularPulm: Bilateral ronchi/rales (R worse than L). Diffusely decreased air entry.  Increased WOB. GI: Soft, NT, ND, +BS. No guarding or rebound tenderness. Ext: No peripheral edema, WWPNeuro: eyes shut, obtunded. non-responsiveSkin: No rashesLabs: Labs:Recent Labs   03/07/230649 NA 141 K 4.2 CL 110 CO2 25 BUN 23 CREATININE 0.96 Recent Labs   03/07/230649 CALCIUM 8.3* Recent Labs   03/06/232315 03/07/230610 03/07/230649 03/07/230748 GLU 117* 128* 137* 148* Recent Labs   03/07/230649 WBC 8.8 HGB 11.5* HCT 36.70* PLT 328 MCV 93.6 RDWCV 13.0 No results for input(s): ALT, AST, ALKPHOS, BILITOT, BILIDIR, ALBUMIN in the last 72 hours.No results for input(s): PTT, LABPROT, INR, FIBRINOGEN in the last 72 hours.No results for input(s): LACTATE in the last 72 hours.No results for input(s): PROCALCITON in the last 72 hours.No results for input(s): TROPONINI, CKTOTAL in the last 72 hours.No results for input(s): BNPPRO in the last 72 hours.No results for input(s): HGBA1C in the last 72 hours.No results for input(s): LIPASE in the last 72 hours.No results for input(s): AMMONIA in the last 72 hours.Urine Studies:No results for input(s): OSMOLALITYUR, SODIUMURR, ALBURR, PRCRRAU, PROTUR, CREATININEUR, UREAUR in the last 72 hours.No results for input(s): CLARITYU, COLORU, SPECGRAV, PHUR, PROTEINUA, GLUCOSEU, KETONESU, BLOODU, BILIRUBINUR, LEUKOCYTESUR, NITRITE, UROBILINOGEN in the last 72 hours.Imaging 24H:No results found.Echo:Results for orders placed or performed during the hospital encounter of 12/17/21 Echo 2D Complete w Doppler and CFI if Ind Image Enhancement 3D and or bubbles Result Value Ref Range  Reported Visual Range EF% 55-60 %  Narrative  ~ * Normal left ventricular size and systolic function. Mild concentric left ventricular hypertrophy.  LVEF  estimated by visual assessment was between 55-60%.* Normal right ventricular cavity size, systolic function and wall motion.  Estimated right ventricular systolic pressure is 61 mmHg.  RV systolic pressure is severely elevated.* Atria are normal in size.* Mild aortic regurgitation.* Mild-moderate mitral regurgitation* Mild - moderate tricuspid regurgitation Cultures:Recent Labs Lab 03/04/231935 03/04/231946 LABBLOO No Growth to Date No Growth to Date Recent Labs Lab 03/04/232009 LABURIN No Growth No results for input(s): THROATCX in the last 168 hours.No results for input(s): SPNEUMONIAEU in the last 168 hours.No results for input(s): LEGIONELLAAG in the last 168 hours.No results for input(s): DEEPWOUNDCX in the last 168 hours.No results for input(s): AFBCULTURE in the last 168 hours.No results for input(s): AFBSTAIN in the last 168 hours.No results for input(s): AFBST in the last 168 hours.Recent Labs Lab 03/04/231947 INFLUENZAART Negative Recent Labs Lab 03/04/231947 INFLUENZBRT Negative  EKG: Results for orders placed or performed during the hospital encounter of 01/03/22 EKG Result Value Ref Range  Heart Rate 102 bpm  QRS Interval 118 ms  QT Interval 410 ms  QTC Interval 534 ms  P Axis    QRS Axis -57 deg  T Wave Axis 0 deg  P-R Interval    SEVERITY Abnormal ECG severity  Assessment & Plan: Attending Provider: Dr. Ronita Hipps?Consults: ID Dr. Eduardo Osier, Palliative care Dr. Payton Emerald?88 y.o. male with PMHx of hypertension, atrial fibrillation (not on a/c), type 2 diabetes (diet controlled), dementia, glaucoma, macular degeneration, and BPH presents with hypoxia and dyspnea found to have worsening bilateral pneumonia on imaging. s/p IV vanc/meropenem. s/p HFNC. Given guarded prognosis - family elected to transition to CMO on 3/7 evening. Pending GIP hospice eval.# CMONo further Lab Draws/FingersticksNo further Diagnostic ImagingDiscontinued IV FluidsDiscontinued IV AntibioticsDiscontinued HFNCOxygen for comfort via Nasal Canula up to 3LCheck Vitals QD24 Hour VisitationLorazepam 2mg  IV q2hr PRN for agitation/anxietyRobinul 0.2mg  IV q6hr for secretionsLantoprost eye drops nightlyTylenol PRN for feversAnti-emetics PRNBowel Regimen OrderedComfort FeedsStart dilaudid drip (3/9- ) iso persistent non-verbal signs/symptoms of discomfort/pain# GOC- prognosis guarded- without prompting, Daughter ([name]) expressed wishes to continue to discuss GOC going forward (3/5)- Per [name], family understands the poor prognosis and would like to discuss palliative consult/CMO approach if no improvements or decline in the next few days. - 3/6: discussed with daughter Reece Levy) at bedside. The 3 sisters would like to discuss comfort focused approach if patient fails SLP again. [name] understands that patient's prognosis is guarded, and even if he fights off the infection and passes swallow eval, risks of recurrent aspirations are very high. [name] endorsing comfort is the most important at this time- but would like to continue with IV abx and further SLP evals as patient appears comfortable to her at this time. - Palliative care consulted 3/6 for GOC clarification.- 3/7: Repeat SLP failed - done as requested by family, despite family understanding that patient will likely fail, in order to help guide family in transitioning him to palliative care/hospice. Family refusing finger sticks throughout the day to provide comfort - POCT order discontinued. Discussed with daugher (shelly) at bedside. [name] endorsing that family would never want to pursue feeding tube including temporary if patient unable to swallow safely (consistent with what other sisters have endorsed). She would like to focus on his comfort. Yet, [name] and sisters would like the patient to stay on HFNC and IV abx at the time as he appears comfortable to them. On going GOC conversation. Family meeting tomorrow at 11am with palliative team per Dr. Payton Emerald to further discuss GOC. - 3/8: Patient transitioned to CMO  overnight iso worsening respiratory status. Family wishing to pursue GIP hospice eval at this time. Palliative care team following, recs appreciated (Dr. Payton Emerald).- 3/9 dilaudid drip started iso worsening non-verbal signs/symptoms of discomfort/pain; pending GIP hospice eval.?DIET:  Comfort feeds/CMOFLUIDS: No fluids, CMOACCESS: PIV for CMO medsCODE STATUS: No ACLS/BLS/CMO?Primary Emergency Contact: Sommer,[name], Home Phone: [phone] Signed:Laurita Peron, MD, PGY-13/07/2022 Mobile [employer] : [phone]

## 2022-01-08 NOTE — Plan of Care
Plan of Care Overview/ Patient Status    SW Admission Screening  Flowsheet Row Most Recent Value SW Admission Screening  Incomplete emergency contact or next of kin on record No Patient identity unknown No Uninsured/under-insured  No Group home or residential placement No Guardianship or conservatorship No Readmission within 30 days Yes International residency or residence outside of [place] No Currently on Dialysis No Active Interpersonal Violence/Sexual Assault No Concern for current abuse or neglect No Concern for current homelessness No Current behavioral health concerns No Concern for active substance use No Screening Result Positive: Social work will follow up.   Theresia Lo, MSWSocial WorkerGreenwich Hospital203 [phone]

## 2022-01-08 NOTE — Discharge Summary
[facility] SummaryPatient Data:  Patient Name: Keith Strickland Admit date: 01/03/2022 Age: 88 y.o. Discharge date: January 20, 2022 DOB: 07-17-1919	 Discharge Attending Physician: Dr. Jene Every MRN: [id number]	 Discharged Condition: critical PCP: Lovena Le, MD Disposition: GIP inpatient hospice Principal Diagnosis: Acute hypoxemic respiratory failure secondary to multifocal bilateral pneumonia Issues to be Addressed Post Discharge: Issues to be Addressed Post Discharge:1.	Continue current care under GIP hospice carePending Labs and Tests: Pending Lab Results   Order Current Status  Blood culture-Collect Blood Cx prior to Antibiotics Preliminary result  Blood culture-Collect Blood Cx prior to Antibiotics Preliminary result  Lower respiratory culture Preliminary result  Lower respiratory culture, qualitative Preliminary result  Hospital Course: Hospital Course: 88 y.o. male PMHx of hypertension, atrial fibrillation, type 2 diabetes (diet controlled), dementia, glaucoma, macular degeneration, and BPH recently discharged from [facility] to the [facility] after hospitalization for multifocal PNA treated with 7 days of rocephin and azithromycin BIBA ambulance for shortness of breath and hypoxia to low 80s per EMS. Per patient's daughter at bedside his only complaint has been shortness of breath. He denies chest pain, nausea, vomiting, diarrhea, abdominal pain. No reported fever or chills.  In the ED, vitals: BP initially 93/70 HR 130 RR 36 SpO2 94% on 6L NC. Blood pressure and HR improved with 1 L NS bolus.  Labs with WBC 10.7 Hgb 12.2 72.3% neutrophils. Na 141 K 5.8 BUN/Cr 27/1.17. Trop 75 --> 70. Lactate 1.7. CRP 12.5, procal pending.   EKG on arrival with A flutter rate of 102. CXR with worsening multifocal PNA worse in the RUL.  Patient given 2L NS bolus (1 by EMS, 1 in ED) with improvements in BP and HR.#Acute hypoxemic respiratory failure 2/2 bilateral, multifocal pneumonia#Recent hospitalization/recurrent aspiration#Sepsis 2/2 pulmonary sourcePatient was started on HFNC and admitted to telemetry. Patient started on Meropenem 1g q12hr (discussed with Dr. Eduardo Osier) given PCN allergy. Patient also started on vancomycin (3/4-3/7; MRSA Negative). Work up  - ABG: 7.42/38/121/98. Influenza and COVID negative. Viral panel negative. lower respiratory culture 3/4: 2+ yeast (preliminary). BCx 3/4: NGTD.  urine cultures : No growth. Inflmmatory markers trendedcrp: 12.5 -> 9.9 -> 8.9; procal: 0.13 -> 0.06; wbc: 10.2 -> 9.1 -> 8.8.Patient continued to require HFNC 40%/40LPM on 3/7. Patient transitioned to CMO on 3/7 evening iso increasing respiratory distress. Patient placed on 2L NC as CMO.#recurrent aspiration/dysphagia- SLP eval 3/6: rec NPO w/ GOC discussions for feeding- SLP eval 3/7 (per family request to help guide them in making GOC decisions), appreciate recs:            - Continue NPO unless per GOC family wliling to accept imminent risk of aspiratoin            - routine and rigorous oral care 3-4x/day with suction set up            - Unless drastic improvement in alertness and respiratory condition occur, no further SLP re-eval warranted at this time - further SLP eval deferred until clearer GOC and improvements in AMS and respiratory status #Elevated troponinLikely demand ischemia iso acute illness/infection- hs trop 75 - 70 - 70 #hypernatremia -> resolved with d5wNa 147 -> 145 -> 16fluids changed to D5 1/2NS at 75cc/hr after resolution of hypernatremia # HTN# HLD# hx afib. PTA Toprol and statin HELD 2/2 iso strict NPO. Patient had recurrent rvr (3/6) during hospitalization iso of acute infection. NSVT 16 beats 3.5 - pt asymptomatic. Patient started on lopressor IV 1.25mg  q6hr once BP. Patient remained persistently tachycardic to 90-120s. lopressor  IV 1.25mg  prn ordered for HR >140. Patient requiring x1 1.25mg  lopressor IV push on 3/7 PM for tachy to 140.  #DM type 2 - A1c 5.9; diet controlled- POCT glucose checks and SSI -> discontinued 3/7 per family request (would like to focus on comfort; however wanting to continue with treating pneumonia and hypoxic resp failure with HFNC) #BPH PTA Proscar 5mg  qd and Flomax 0.4 mg bid HELD as NPO. Bladder scans and straight cath if retaining #HypothyroidismLevothyroxine 150 mcg qd po HELD iso NPO. Started IV synthroid qd instead. # GOC- prognosis guarded- without prompting, Daughter ([name]) expressed wishes to continue to discuss GOC going forward (3/5)- Per [name], family understands the poor prognosis and would like to discuss palliative consult/CMO approach if no improvements or decline in the next few days. - 3/6: discussed with daughter Reece Levy) at bedside. The 3 sisters would like to discuss comfort focused approach if patient fails SLP again. [name] understands that patient's prognosis is guarded, and even if he fights off the infection and passes swallow eval, risks of recurrent aspirations are very high. [name] endorsing comfort is the most important at this time- but would like to continue with IV abx and further SLP evals as patient appears comfortable to her at this time. - Palliative care consulted 3/6 for GOC clarification.- 3/7: Repeat SLP failed - done as requested by family, despite family understanding that patient will likely fail, in order to help guide family in transitioning him to palliative care/hospice. Family refusing finger sticks throughout the day to provide comfort - POCT order discontinued. Discussed with daugher (shelly) at bedside. [name] endorsing that family would never want to pursue feeding tube including temporary if patient unable to swallow safely (consistent with what other sisters have endorsed). She would like to focus on his comfort. Yet, [name] and sisters would like the patient to stay on HFNC and IV abx at the time as he appears comfortable to them. On going GOC conversation. Pending family meeting 3/7 11am with palliative team per Dr. Payton Emerald to further discuss GOC. - 3/8: Patient transitioned to CMO overnight iso worsening respiratory status. Family wishing to pursue GIP hospice eval at this time. Palliative care team following, appreciate recs. - 3/9 dilaudid drip started iso worsening non-verbal signs/symptoms of discomfort/pain; - 3/9: patient accepted to [facility] inpatient hospice. Patient discharged to inpatient hospice services. Inpatient Consultants and summary of recommendations:Palliative care (Dr. Payton Emerald) - GOC clarification; hospice referralPertinent lab findings and test results: Objective: Recent Labs Lab 03/05/230545 03/06/230519 03/07/230649 WBC 10.2 9.1 8.8 HGB 10.7* 11.2* 11.5* HCT 34.20* 36.70* 36.70* PLT 277 298 328  Recent Labs Lab 03/05/230545 03/06/230519 03/07/230649 NEUTROPHILS 71.2 72.8* 74.2*  Recent Labs Lab 03/05/230545 03/05/230721 03/06/230519 03/06/230621 03/07/230649 03/07/230748 NA 147*  --  145  --  141  --  K 4.3  --  4.4  --  4.2  --  CL 116*  --  113  --  110  --  CO2 26  --  26  --  25  --  BUN 24  --  23  --  23  --  CREATININE 0.88  --  0.85  --  0.96  --  GLU 110*   < > 123*   < > 137* 148* ANIONGAP 5  --  6  --  6  --   < > = values in this interval not displayed.  Recent Labs Lab 03/05/230545 03/05/232118 03/06/230519 03/07/230649 CALCIUM 7.9*  --  8.2* 8.3* MG  --    < >  2.0  --  PHOS  --    < > 3.2  --   < > = values in this interval not displayed.  Recent Labs Lab 03/04/231937 ALT 18 AST 37 ALKPHOS 62 BILITOT 0.4  Recent Labs Lab 03/04/231948 LABPROT 10.9 INR 0.99  Culture Information:Recent Labs Lab 03/04/231935 03/04/231946 03/04/232009 LABBLOO No Growth to Date No Growth to Date  --  LABURIN --   --  No Growth Imaging: Imaging results last 1 week:  XR Chest PA or APResult Date: 3/4/2023Limited AP view- New/increased multifocal pneumonia most prominently at the right upper lobe. [facility] Radiology Notify System Classification: Orange - Urgent without Follow-up Reported and Signed by:  Lonia Chimera, MD Diet: Regular diet - comfort foodMobility: Highest Level of mobility - ACTUAL: Mobillty Level 1, Lying in bed , AM PAC < 6Physical Therapy Disposition Recommendation: Short Term Rehab Physical Exam Discharge vitals: Temp:  [97.6 ?F (36.4 ?C)-99 ?F (37.2 ?C)] (P) 99 ?F (37.2 ?C)Pulse:  [85-100] (P) 100Resp:  [26-30] (P) 26BP: (144)/(60) (P) 63/38SpO2:  [98 %] 98 %O2 Flow (L/min):  [2-3] (P) 2 Gen:?Ill appearing, lying in bed, NC in place, eyes shut, non-responsive?HEENT:?PERRL, anicteric, oropharynx benign, dry mucous membranes?CV:?tachycardic, irregularPulm:?Bilateral ronchi/rales (R worse than L). Diffusely decreased air entry.  Increased WOB. GI:?Soft, NT, ND, +BS. No guarding or rebound tenderness. Ext:?No peripheral edema, WWPNeuro:?eyes shut, obtunded.?non-responsiveSkin:?No rashesAllergies Allergies Allergen Reactions ? Gluten Protein Other (See Comments)   intolerance ? Lactose Other (See Comments)   intolerance ? Avelox [Moxifloxacin]  ? Ciprofloxacin  ? Penicillins  ? Sulfa (Sulfonamide Antibiotics)   PMH PSH Past Medical History: Diagnosis Date ? Anxiety  ? Closed displaced fracture of left femoral neck (HC Code)  ? GERD (gastroesophageal reflux disease)  ? Glaucoma  ? Hyperlipidemia  ? Hypertension  ? Hypothyroidism  ? IBS (irritable bowel syndrome)  ? Kyphosis  ? Lumbosacral radiculopathy  ? Major depression  ? Osteoarthritis  ? Paroxysmal atrial fibrillation (HC Code) (HC CODE) (HC Code)  ? Pneumonia  ? Vitamin deficiency   Past Surgical History: Procedure Laterality Date ? FRACTURE SURGERY    Social History Family History Social History Tobacco Use ? Smoking status: Never ? Smokeless tobacco: Never Substance Use Topics ? Alcohol use: No  Family History Problem Relation Age of Onset ? No Known Problems Mother  ? No Known Problems Father   There was at total of approximately 30 minutes spent on the discharge of this patientElectronically Signed:Kyro Joswick Hall Busing, MD 01-23-22 1:18 PM

## 2022-01-08 NOTE — Progress Notes
[facility] Progress NoteAttending Provider: Jene Every, MD Subjective                                                                              Subjective: Interim History: -now on Dilaudid PCA.  Appears more comfortable-family at bedsideReview of Allergies/Meds/Hx: Review of Allergies/Meds/Hx:I have reviewed the patient's: allergies and current scheduled medications Objective Objective: Vitals:Last 24 hours: Temp:  [97.6 ?F (36.4 ?C)] 97.6 ?F (36.4 ?C)Pulse:  [85] 85Resp:  [30] 30BP: (144)/(60) 144/60SpO2:  [98 %] 98 %I/O's:No intake or output data in the 24 hours ending 2022/02/01 1100Procedures:Physical Exam Constitutional:     Appearance: elderly male; somnolentCardiovascular:    Rate and Rhythm:Pulmonary:    Comments:  Mouth breathing, no obvious respiratory distressAbdominal:     Tenderness: There is no abdominal tenderness. Musculoskeletal:     Right lower leg: No edema.    Left lower leg: No edema. Neurological:    Comments:  somnolentLabs:Last 24 hours: No results found for this or any previous visit (from the past 24 hour(s)).Diagnostics:CXR: New/increased multifocal pneumonia most prominently at the right upper lobe.  Assessment Assessment: Assessment:88 yo male, resident at [facility], with PMH of AFib (not on A/C), DM (not on meds), HTN, BPH, anxiety, hypothyroidism, cavernous malformation, small traumatic intraparenchymal hemorrhage in 2019, memory loss, gait abnormality.Was in [facility] 2/15-2/21 from  home with lethargy, tachypnea, hypoxemia. Found to have bilateral pneumonia. Question sepsis given lethargy (encephalopathy), AKI (cr 1.5 from normal baseline), tachypnea and tachycardia. Improved with IVF, course of azithro/rocephin. Diet resumed and was on RA prior to transfer to [facility] for STR. On 3/4 pt got lethargic with tachypnea and was desaturating in low 80's so sent back to ED. In ED pt altered with hypoxia, soft BP, worsening infiltrate on CXR. Placed on HF oxygen, bolused with IVF, started on IV vanco and merem, made NPO and admitted to teleSuspect recurrent aspiration pneumoniaWith continued respiratory decline, patient appropriately now CMO Plan Plan: -CMO care-high-flow discontinued.  Now on nasal cannula for comfort.-antibiotics, all other therapeutic medications discontinued-continue Robinul for secretions-Dilaudid PCA , Ativan p.r.n.-pleasure feeding but he seems too somnolent to tolerate any  adequate p.o..  Also very high aspiration risk.  Family aware that nutrition no longer considered part of CMO care-evaluation for inpatient hospice today.  He should meet criteria being that he is on IV PCA-discussed with family at bedside Electronically Signed:[id number] Pau Banh MD 14233/07/2022,

## 2022-01-08 NOTE — Plan of Care
[facility]	Spiritual Care NotePurpose: Support family after patient diedObservation: People present/  familyEmotional   appropriately sad and grieving  Subjective: As per family request, family at bedside with pt who had just died. Offered Jewish prayers and music that comforted family. Confirmed with [employer] that all funeral plans are in place.Spiritual Intervention Index**Date of Spiritual Visit: 04/06/23Visit Type: ConsultIntervention Type: Spiritual VisitResponding Chaplain: Palliative CareConsulted With: Palliative Care, NurseReligious Needs: Prayer, Pastoral care brochureSpiritual/Religious Support Provided: Family Support, Music, Companionship, Spiritual Support	Signed: Gypsy Decant, PhD/ PC Chaplain2023-04-061:03 PM

## 2022-01-08 NOTE — Plan of Care
Plan of Care Overview/ Patient Status      Assumed care of patient at 1900;?obtunded throughout; unable to communicate needs.?Comfort measures continue;?no acute cardiac / resp?distress?observed /?reported?overnight.??IVP Dilaudid alternated w/ IVP Morphine throughout the night to maintain comfort; IVP Ativan administered Q 2-4hrs for tachypnea / tachycardia.  Patient is incontinent of B/B;  no BM reported thus far. Peri-care provided; Protective dressings remain in place to thoracic and sacral spine; barrier cream applied to surrounding tissues to maintain skin integrity; T&P encouraged Q2h as tolerated for comfort and pressure relief; pillow support utilized to offload bony prominences.??Presently sleeping peacefully in bed in NAD;?HOB elevated to enhance respiration. Call bell remains within reach; comfort and safety maintained throughout. ?

## 2022-01-08 NOTE — Progress Notes
Assumed care of pt at?0700.Pt is CMO,?obtunded.Pt found asystole and pronounced by MD Park at 1244. NEDS contact, pt denied for donation at this time. Post mortem care provided and patient transported safely down to morgue.Disposition form complete and belongings returned to family (clock radio).Caregiver and family at bedside and involved in care. Spiritual care provided.?

## 2022-01-08 NOTE — Discharge Summary
[facility] Med/Surg Discharge SummaryPatient Data:  Patient Name: Keith Strickland Admit date: 02-07-22 Age: 88 y.o. Discharge date: 02-07-2022 DOB: 11-Feb-1919	 Discharge Attending Physician: Jene Every, MD  MRN: [id number]	 Discharged Condition: expired PCP: Lovena Le, MD Disposition: Expired Principal Diagnosis: Acute hypoxemic respiratory failure secondary to bilateral multifocal pneumoniaIssues to be Addressed Post Discharge: Hospital Course: Hospital Course: 88 y.o. male PMHx of hypertension, atrial fibrillation, type 2 diabetes (diet controlled), dementia, glaucoma, macular degeneration, and BPH recently discharged from [facility] to the [facility] after hospitalization for multifocal PNA treated with 7 days of rocephin and azithromycin BIBA ambulance for shortness of breath and hypoxia to low 80s per EMS found to have worsening b/l multifocal pneumonia.Patient treated with IV meropenem and IV vancomycin, but continued to require HFNC.Given guarded prognosis, after multiple GOC discussions with the family (palliative care consulted on 3/6), decision made to transition patient to CMO on 3/7. Patient was started on dilaudid drip on 3/9 iso worsening respiratory distress and non-verbal signs of discomfort/pain. Patient evaluated by [facility] hospice services on 3/9, and patient accepted to [facility] inpatient hospice services.At 12:44pm, patient passed comfortably with the presence of his family members at bedside (Keith Strickland, Keith Strickland, Keith's husband). Autopsy offered - family declined. Pertinent lab findings and test results: Objective: Recent Labs Lab 03/05/230545 03/06/230519 03/07/230649 WBC 10.2 9.1 8.8 HGB 10.7* 11.2* 11.5* HCT 34.20* 36.70* 36.70* PLT 277 298 328  Recent Labs Lab 03/05/230545 03/06/230519 03/07/230649 NEUTROPHILS 71.2 72.8* 74.2*  Recent Labs Lab 03/05/230545 03/05/230721 03/06/230519 03/06/230621 03/07/230649 03/07/230748 NA 147*  --  145  --  141  --  K 4.3  --  4.4  --  4.2  --  CL 116*  --  113  --  110  --  CO2 26  --  26  --  25  --  BUN 24  --  23  --  23  --  CREATININE 0.88  --  0.85  --  0.96  --  GLU 110*   < > 123*   < > 137* 148* ANIONGAP 5  --  6  --  6  --   < > = values in this interval not displayed.  Recent Labs Lab 03/05/230545 03/05/232118 03/06/230519 03/07/230649 CALCIUM 7.9*  --  8.2* 8.3* MG  --    < > 2.0  --  PHOS  --    < > 3.2  --   < > = values in this interval not displayed.  Recent Labs Lab 03/04/231937 ALT 18 AST 37 ALKPHOS 62 BILITOT 0.4  Recent Labs Lab 03/04/231948 LABPROT 10.9 INR 0.99  Culture Information:Recent Labs Lab 03/04/231935 03/04/231946 03/04/232009 LABBLOO No Growth to Date No Growth to Date  --  LABURIN  --   --  No Growth Imaging: Imaging results last 1 week:  XR Chest PA or APResult Date: 3/4/2023Limited AP view- New/increased multifocal pneumonia most prominently at the right upper lobe. [facility] Radiology Notify System Classification: Orange - Urgent without Follow-up Reported and Signed by:  Lonia Chimera, MD Diet:  Regular diet - comfort foodMobility:  Physical Therapy Disposition Recommendation: Short Term Rehab Physical Exam Discharge Physical Exam:On exam, he was apneic, unresponsive, pupils were fixed and dilated, and he had no cardiac or respiratory sounds on auscultation.?Allergies Allergies Allergen Reactions ? Gluten Protein Other (See Comments)   intolerance ? Lactose Other (See Comments)   intolerance ? Avelox [Moxifloxacin]  ? Ciprofloxacin  ? Penicillins  ? Sulfa (Sulfonamide Antibiotics)   PMH  PSH Past Medical History: Diagnosis Date ? Anxiety  ? Closed displaced fracture of left femoral neck (HC Code)  ? GERD (gastroesophageal reflux disease)  ? Glaucoma  ? Hyperlipidemia  ? Hypertension  ? Hypothyroidism  ? IBS (irritable bowel syndrome)  ? Kyphosis  ? Lumbosacral radiculopathy  ? Major depression  ? Osteoarthritis  ? Paroxysmal atrial fibrillation (HC Code) (HC CODE) (HC Code)  ? Pneumonia  ? Vitamin deficiency   Past Surgical History: Procedure Laterality Date ? FRACTURE SURGERY    Social History Family History Social History Tobacco Use ? Smoking status: Never ? Smokeless tobacco: Never Substance Use Topics ? Alcohol use: No  Family History Problem Relation Age of Onset ? No Known Problems Mother  ? No Known Problems Father   There was at total of approximately 30 minutes spent on the discharge of this patientElectronically Signed:Shirlee Whitmire Hall Busing, MD 2022/01/14 1:12 PM

## 2022-01-08 NOTE — Plan of Care
RN Hospice NoteHarold Strickland is a Hospice Patient. Current active issues include: Actively transitioning Current interventions include: continuous dilaudid infusionResponse to interventions: dignified transitionUpdated/planned interventions include: continue infusion and PRN meds Patient/Family discussion covered: with dtrs, [name] & ShellyPatient/Family acceptance: Consents obtained for inpatient hospice servicesKesha K Jisselle Poth, RNPlan of Care Overview/ Patient Status    Actively transitioning pt received with indications of imminent expiration; agonal EOL breathing with RR observed at 26, continuous dilaudid infusion was in place at 1.5mg /hr-pt expired moments post inpatient hospice conversion with pronouncement by Dr. Willaim Bane at 12:44pm.

## 2022-01-08 NOTE — Utilization Review (ED)
UM Status: Meets Inpatient Status, transitioned to hospice for comfort care

## 2022-01-08 NOTE — Plan of Care
Pt now transitioned to CMO, will be discharged from skilled PT at this time. Anna Genre, PT, DPT Physical MedicineGreenwich [facility] Heartbeat: [phone]

## 2022-01-09 ENCOUNTER — Encounter
Admit: 2022-01-09 | Payer: PRIVATE HEALTH INSURANCE | Attending: Vascular and Interventional Radiology | Primary: Internal Medicine

## 2022-01-09 LAB — LOWER RESP CULTURE, QUAL

## 2022-01-09 LAB — BLOOD CULTURE   (BH GH L LMW YH)
BKR BLOOD CULTURE: NO GROWTH
BKR BLOOD CULTURE: NO GROWTH

## 2022-01-14 ENCOUNTER — Encounter
Admit: 2022-01-14 | Payer: PRIVATE HEALTH INSURANCE | Attending: Vascular and Interventional Radiology | Primary: Internal Medicine

## 2022-01-18 NOTE — ED Provider Notes
.? 2014 CD-Notes ?  All Rights [facility] Emergency Department	Chief Complaint:Chief Complaint Patient presents with ? Tachycardia ? Altered Mental Status   BIBA from the [facility] for AMS, hypotension, tachycardia. EMS reports pt BP was 90/50 upon their arrival, PIV placed NS IV bolus infusing upon arrival to ED. FS 176. Hypoxia to 82% on 2L O2 NC which he wears continuously at home. EMS administered O2 6L NC with improvement to 94%. Afebrile per facility. PT recently d/c'd from [facility].   ZOX:WRUEAV Keith Strickland, 88 y.o., male, presents to the ED from the [facility] for shortness of breath, tachycardia, and hypotension. EMS provided an IV fluids bolus for systolic BP of 90. Patient initially encountered at 82% SpO2. Patient does report some shortness of breath but denies any pain. History otherwise limited secondary to patient mental status. Review of the EMR demonstrates admission 2/15-2/21 for bilateral pneumonia, AKI, and hypernatremia. He was treated then with Rocephin and Azithromycin, underwent speech evaluation, and started on a dysphagia diet with purees and thickened liquids. Historian: patient, EMSReview of Systems: ROS limited due to patient mental statusAs per HPiPast Medical History:Past Medical History: Diagnosis Date ? Anxiety  ? Closed displaced fracture of left femoral neck (HC Code)  ? GERD (gastroesophageal reflux disease)  ? Glaucoma  ? Hyperlipidemia  ? Hypertension  ? Hypothyroidism  ? IBS (irritable bowel syndrome)  ? Kyphosis  ? Lumbosacral radiculopathy  ? Major depression  ? Osteoarthritis  ? Paroxysmal atrial fibrillation (HC Code) (HC CODE) (HC Code)  ? Pneumonia  ? Vitamin deficiency  Past Surgical History: Procedure Laterality Date ? FRACTURE SURGERY   Social History:Social History Tobacco Use ? Smoking status: Never ? Smokeless tobacco: Never Substance Use Topics ? Alcohol use: No ? Drug use: No Family History:Family History Problem Relation Age of Onset ? No Known Problems Mother  ? No Known Problems Father  Medications: Medication List  ASK your doctor about these medications  acetaminophen 325 mg tabletCommonly known as: TYLENOLTake 2 tablets (650 mg total) by mouth every 6 (six) hours as needed (Pain/Fever). azelastine 0.05 % ophthalmic solutionCommonly known as: OPTIVARPlace 1 drop into both eyes daily. cholecalciferol (vitamin D3) 125 mcg (5,000 unit) capsule escitalopram oxalate 10 mg tabletCommonly known as: LEXAPRO finasteride 5 mg tabletCommonly known as: PROSCARTake 1 tablet (5 mg total) by mouth daily. latanoprost 0.005 % ophthalmic solutionCommonly known as: XALATAN levothyroxine 150 mcg tabletCommonly known as: SYNTHROID, LEVOTHROID magnesium hydroxide 400 mg/5 mL suspensionCommonly known as: MILK OF MAGNESIA melatonin 1 mg tablet metoprolol succinate XL 25 mg 24 hr tabletCommonly known as: TOPROL-XLTake 0.5 tablets (12.5 mg total) by mouth daily. Take with or immediately following a meal. OLANZapine 2.5 mg tabletCommonly known as: ZyPREXATake 1 tablet (2.5 mg total) by mouth nightly. omeprazole 20 mg capsuleCommonly known as: PriLOSECTake 1 capsule (20 mg total) by mouth 2 times daily (0900, 1700). polyethylene glycol 17 gram packetCommonly known as: MIRALAXTake 1 packet (17 g total) by mouth daily. Mix in 8 ounces of fluid pravastatin 20 mg tabletCommonly known as: PRAVACHOLTake 1 tablet (20 mg total) by mouth daily with dinner. Systane Balance 0.6 % ophthalmic solutionGeneric drug: propylene glycoL tamsulosin 0.4 mg 24 hr capsuleCommonly known as: FLOMAX timolol 0.5 % ophthalmic solutionCommonly known as: TIMOPTICPlace 1 drop into both eyes daily.  Allergies as of 01/03/2022 - Review Complete 01/03/2022 Allergen Reaction Noted ? Gluten protein Other (See Comments) 10/29/2013 ? Lactose Other (See Comments) 10/29/2013 ? Avelox [moxifloxacin]  02/16/2016 ? Ciprofloxacin  11/10/2018 ? Penicillins  10/28/2013 ? Sulfa (sulfonamide  antibiotics)  10/28/2013 Physical Exam:  Vital signs were reviewedBP (!) 148/76  - Pulse (!) 106  - Temp 99.6 ?F (37.6 ?C) (Rectal)  - Resp (!) 42  - SpO2 96% General Appear:	Elderly male weak-appearing, in no apparent distress. Frequent congested cough. Head:			negativeEars/Nose/Throat:	airway not compromised and mucosa moistEyes:			negativeCardiovascular:	S1S2 normal. Regular rate and rhythm. No murmurs. Respiratory:		Decreased air entry bilaterally; tachypneic; abdominal muscle use.  No retractions. Non-hypoxic on supplemental O2. Back: 			no CVA tendernessAbdomen:		soft, nondistended, non-tender, without guarding, rigidity, or rebound, no masses palpated, normal bowel soundsMusculoskeletal:	no joint tenderness, deformity or swelling; no calf tenderness or palpable cords. Dressings in place over bed sores on sacrum, and 2x over the back. Neurologic:		AOX2. Rousable to voice. Able to answer simple questions. No obvious focal deficits. Medical Decision Making:Nursing notes were reviewed: YesI reviewed the following historical information: medical recordsOxygen saturation interpretation: 95% on 6L NC - nonhypoxic. Laboratory studies:Results for orders placed or performed during the hospital encounter of 01/03/22 Blood culture-Collect Blood Cx prior to Antibiotics  Specimen: Peripheral; Blood Result Value Ref Range  Blood Culture No Growth to Date  Blood culture-Collect Blood Cx prior to Antibiotics  Specimen: Peripheral; Blood Result Value Ref Range  Blood Culture No Growth to Date  SARS CoV-2 (COVID-19) RNA-[place] [facility] ([facility])  Specimen: Nasopharynx; Viral Result Value Ref Range  SARS-CoV-2 RNA (COVID-19)  Negative Negative Influenza A+B/RSV by RT-PCR ([facility])  Specimen: Nasopharynx; Viral Result Value Ref Range  Influenza A Negative Negative  Influenza B Negative Negative  Respiratory Syncytial Virus Negative Negative Lactic acid, plasma (sepsis, reflex 2h repeat) Result Value Ref Range  Lactate 1.7 0.4 - 2.0 mmol/L Protime and INR Result Value Ref Range  Prothrombin Time 10.9 9.4 - 12.0 seconds  INR 0.99 0.89 - 1.14 CBC auto differential Result Value Ref Range  WBC 10.7 4.0 - 11.0 x1000/?L  RBC 4.10 4.00 - 6.00 M/?L  Hemoglobin 12.2 (L) 13.2 - 17.1 g/dL  Hematocrit 16.10 96.04 - 50.00 %  MCV 94.9 80.0 - 100.0 fL  MCH 29.8 27.0 - 33.0 pg  MCHC 31.4 31.0 - 36.0 g/dL  RDW-CV 54.0 98.1 - 19.1 %  Platelets 355 150 - 420 x1000/?L  MPV 9.8 8.0 - 12.0 fL  Neutrophils 72.3 (H) 39.0 - 72.0 %  Lymphocytes 19.7 17.0 - 50.0 %  Monocytes 7.2 4.0 - 12.0 %  Eosinophils 0.1 0.0 - 5.0 %  Basophil 0.1 0.0 - 1.4 %  Immature Granulocytes 0.6 0.0 - 1.0 %  nRBC 0.0 0.0 - 1.0 %  ANC(Abs Neutrophil Count) 7.70 (H) 2.00 - 7.60 x 1000/?L  Absolute Lymphocyte Count 2.10 0.60 - 3.70 x 1000/?L  Monocyte Absolute Count 0.77 0.00 - 1.00 x 1000/?L  Eosinophil Absolute Count 0.01 0.00 - 1.00 x 1000/?L  Basophil Absolute Count 0.01 0.00 - 1.00 x 1000/?L  Absolute Immature Granulocyte Count 0.06 0.00 - 0.30 x 1000/?L  Absolute nRBC 0.00 0.00 - 1.00 x 1000/?L Comprehensive metabolic panel Result Value Ref Range  Sodium 141 136 - 145 mmol/L  Potassium 5.8 (H) 3.5 - 5.1 mmol/L  Chloride 112 95 - 115 mmol/L  CO2 27 21 - 32 mmol/L  Anion Gap 2 (L) 5 - 18  Glucose 158 (H) 70 - 100 mg/dL  BUN 27 (H) 8 - 25 mg/dL  Creatinine 4.78 2.95 - 1.30 mg/dL  Calcium 8.4 8.4 - 62.1 mg/dL  BUN/Creatinine Ratio 30.8 8.0 - 25.0  Total Protein 6.6 6.4 - 8.2 g/dL  Albumin 2.2 (L) 3.4 - 5.0 g/dL  Total Bilirubin 0.4 0.0 - 1.0 mg/dL  Alkaline Phosphatase 62 20 - 135 U/L  Alanine Aminotransferase (ALT) 18 12 - 78 U/L  Aspartate Aminotransferase (AST) 37 5 - 37 U/L  Globulin 4.4 g/dL  A/G Ratio 0.5   AST/ALT Ratio 2.1 See Comment  Osmolality Calculation 290 275 - 295 mOsm/kg  eGFR (Creatinine) 56 (L) >=60 mL/min/1.76m2 UA reflex to culture  Specimen: Urine, Clean Catch Result Value Ref Range  Reflex Urine Culture See Comment  Urinalysis with culture reflex     ([facility])  Specimen: Urine, Clean Catch Result Value Ref Range  Clarity, UA Clear Clear  Color, UA Yellow Yellow, Colorless  Specific Gravity, UA 1.018 1.005 - 1.030  pH, UA 6.0 5.5 - 7.5  Protein, UA 1+ (A) Negative, Trace  Glucose, UA Negative Negative  Ketones, UA Negative Negative  Blood, UA Negative Negative  Bilirubin, UA Negative Negative  Leukocytes, UA 3+ (A) Negative  Nitrite, UA Negative Negative  Urobilinogen, UA <2.0 <=2.0 mg/dL C-reactive protein Result Value Ref Range  C-Reactive Protein 12.5 (H) 0.0 - 1.0 mg/dL Procalcitonin     ([facility] Q [facility]) Result Value Ref Range  Procalcitonin 0.13 See Comment ng/mL Troponin T High Sensitivity, Emergency; 0 hour baseline AND 1 hour with reflex (3 hour) Result Value Ref Range  High Sensitivity Troponin T 75 (H) See Comment ng/L Urine microscopic     ([facility]) Result Value Ref Range  RBC/HPF, UA 1 0 - 2 /HPF  WBC/HPF, UA 16 (H) 0 - 5 /HPF  Urine Squamous Epithelial Cells, UA 1 0 - 5 /HPF Troponin T High Sensitivity, 1 Hour With Reflex ([facility]) Result Value Ref Range  High Sensitivity Troponin T 70 (H) See Comment ng/L  1 hour Delta from 0 Hour, HS-Troponin T -5 ng/L EKG Result Value Ref Range  Heart Rate 102 bpm  QRS Interval 118 ms  QT Interval 410 ms  QTC Interval 534 ms  P Axis    QRS Axis -57 deg  T Wave Axis 0 deg  P-R Interval    SEVERITY Abnormal ECG severity Radiological Imaging Studies:XR Chest PA or AP Final Result Limited AP view- New/increased multifocal pneumonia most prominently at the right upper lobe.  [facility] Radiology Notify System Classification: Orange - Urgent without Follow-up  Reported and Signed by:  Lonia Chimera, MD   EKG-atrial flutter with variable block@102 , LAD, RBBBIndependently interpreted by ED provider: Labs / EKG / RadDiscussed patient with another provider: Yes Dr. Berkley Harvey, hospitalistSummary of ED Vital Signs:Vitals:  01/03/22 2000 01/03/22 2100 01/03/22 2140 01/03/22 2151 BP: 124/64 116/65 (!) 148/76  Pulse: (!) 104 (!) 94 (!) 106  Resp: (!) 42 (!) 43 (!) 42  Temp: 99.6 ?F (37.6 ?C)    TempSrc: Rectal    SpO2: 95% 95% 94% 96% Medical Decision Making24 y.o. male presents to the ED for evaluation of shortness of breath, hypotension, and tachycardia. Sepsis workup initiated. Labs including blood culture ordered. CXR and EKG ordered. Treat with IV fluids. Reassess. 9:15 PMUpdated daughter and son-in-law, who is a cardiologist. DNR / DNI status confirmed, though they would accept BiPAP and antibiotics. Daughter: [phone] PMHR to 84bpm s/p fluids and Tylenol. Discussed patient with Dr. Berkley Harvey for admission; admit to telemetry. Patient reassessed with RR of 43; start high-flow NC10:16 PMTeam at bedside assuming care of the patient.  Will start Meropenem.Amount and/or Complexity of Data ReviewedLabs: ordered. Decision-making details documented in ED Course.Radiology: ordered. Decision-making  details documented in ED Course.ECG/medicine tests: ordered and independent interpretation performed. Decision-making details documented in ED Course.RiskOTC drugs.Prescription drug management.Decision regarding hospitalization.______________________________________________________________________Condition: 	[place]: 	AdmittedDiagnosis: 	The primary encounter diagnosis was Respiratory failure, unspecified chronicity, unspecified whether with hypoxia or hypercapnia (HC Code) (HC CODE) (HC Code). Diagnoses of Bacterial pneumonia, By signing my name below, I, Lyanne Co, attest that this documentation has been prepared under the direction and in the presence of Chandrika Sandles, Clydie Braun, DOElectronically Signed: Lyanne Co, scribe. 01/03/2022. 8:16 PM.I, Sequita Wise, Clydie Braun, DO personally performed the services described in this documentation. All medical record entries made by the scribe were at my direction and in my presence. I have reviewed the chart and discharge instructions and agree that the record reflects my personal performance and is accurate and complete. Leanora Ivanoff, DO. 01/03/2022. 8:16 PM.?2014 CD-Notes?  LLC All Rights Reserved; version 2.0; revised November, 2018.[email or web address] Leanora Ivanoff, DO03/19/23 1759

## 2022-01-31 NOTE — Death Pronouncement
Death NoteI was called to Keith Strickland bedside for evaluation of asystole. On exam, he was apneic, unresponsive, pupils were fixed and dilated, and he had no cardiac or respiratory sounds on auscultation.Hospital Location: GHAttending Physican ContactAttending Physician Notified: YesAttending Physician Name: Dr. Ronita Hipps, AdnanOutpatient Provider Notified: YesPhysician's Post Mortem ChecklistPronounced By: Carlean Jews ParkDate pronounced death: 03/11/2023Time pronounced death: 1244Date of Actual Death: 03-11-2023Time of Actual Death: 1244Family present at time of death: [name] of family present at time of death: [name] and [name] (daughters)Name of Next of Kin Notified of Death: AmyRelationship of Next of Kin: DaughterPost-Mortem Examination Magazine features editor & Autopsy Information) - Please refer to postmortem checklist for most updated information related to autopsy and medical examiner case information.Organ Donation (NEDS) - Please refer to postmortem checklist for most updated information related to NEDS notification and status of organ and tissue donation.Terrence Dupont, MD03/11/23

## 2022-01-31 DEATH — deceased
